# Patient Record
Sex: Female | Born: 1950 | Race: White | Hispanic: No | Marital: Married | Smoking: Never smoker
Health system: Southern US, Community
[De-identification: ages and names within clinical notes are randomized; demographics above are authoritative.]

## PROBLEM LIST (undated history)

## (undated) DIAGNOSIS — K219 Gastro-esophageal reflux disease without esophagitis: Secondary | ICD-10-CM

## (undated) DIAGNOSIS — I208 Other forms of angina pectoris: Secondary | ICD-10-CM

## (undated) DIAGNOSIS — I2089 Other forms of angina pectoris: Secondary | ICD-10-CM

## (undated) DIAGNOSIS — I10 Essential (primary) hypertension: Secondary | ICD-10-CM

## (undated) DIAGNOSIS — M1990 Unspecified osteoarthritis, unspecified site: Secondary | ICD-10-CM

## (undated) HISTORY — PX: BREAST BIOPSY: SHX20

## (undated) HISTORY — DX: Gastro-esophageal reflux disease without esophagitis: K21.9

## (undated) HISTORY — PX: BACK SURGERY: SHX140

## (undated) HISTORY — PX: DILATION AND CURETTAGE OF UTERUS: SHX78

## (undated) HISTORY — PX: TUBAL LIGATION: SHX77

---

## 2008-07-23 ENCOUNTER — Ambulatory Visit: Payer: Self-pay | Admitting: Family Medicine

## 2009-01-20 ENCOUNTER — Ambulatory Visit: Payer: Self-pay | Admitting: Internal Medicine

## 2010-08-30 ENCOUNTER — Ambulatory Visit: Payer: Self-pay | Admitting: Internal Medicine

## 2011-08-11 ENCOUNTER — Ambulatory Visit: Payer: Self-pay | Admitting: Internal Medicine

## 2012-04-24 HISTORY — PX: BACK SURGERY: SHX140

## 2014-11-05 ENCOUNTER — Ambulatory Visit
Admission: RE | Admit: 2014-11-05 | Discharge: 2014-11-05 | Disposition: A | Discharge status: Home / Self Care | Payer: Self-pay | Source: Ambulatory Visit | Attending: Family Medicine | Admitting: Family Medicine

## 2014-11-05 ENCOUNTER — Encounter: Payer: Self-pay | Admitting: Family Medicine

## 2014-11-05 ENCOUNTER — Ambulatory Visit (INDEPENDENT_AMBULATORY_CARE_PROVIDER_SITE_OTHER): Payer: Self-pay | Admitting: Family Medicine

## 2014-11-05 ENCOUNTER — Ambulatory Visit: Admission: RE | Admit: 2014-11-05 | Payer: Self-pay | Source: Ambulatory Visit | Admitting: *Deleted

## 2014-11-05 VITALS — BP 112/60 | HR 70 | Ht 65.0 in | Wt 199.0 lb

## 2014-11-05 DIAGNOSIS — M519 Unspecified thoracic, thoracolumbar and lumbosacral intervertebral disc disorder: Secondary | ICD-10-CM

## 2014-11-05 DIAGNOSIS — M545 Low back pain, unspecified: Secondary | ICD-10-CM

## 2014-11-05 DIAGNOSIS — M4647 Discitis, unspecified, lumbosacral region: Secondary | ICD-10-CM | POA: Insufficient documentation

## 2014-11-05 LAB — POCT URINALYSIS DIPSTICK
BILIRUBIN UA: NEGATIVE
Blood, UA: NEGATIVE
GLUCOSE UA: NEGATIVE
KETONES UA: NEGATIVE
LEUKOCYTES UA: NEGATIVE
Nitrite, UA: NEGATIVE
PH UA: 5
Protein, UA: NEGATIVE
Spec Grav, UA: 1.01
Urobilinogen, UA: 0.2

## 2014-11-05 MED ORDER — IBUPROFEN 800 MG PO TABS: 800.0000 mg | ORAL_TABLET | Freq: Three times a day (TID) | ORAL | Status: DC | PRN

## 2014-11-05 NOTE — Progress Notes (Signed)
Name: Lauren Leblanc   MRN: [id number]    DOB: 1950-11-03   Date:11/05/2014       Progress Note  Subjective  Chief Complaint  Chief Complaint  Patient presents with  . Back Pain    lower back pain on R) side- started 2 weeks ago, but worse in the past 2 days    Back Pain This is a new problem. The problem occurs constantly. The problem has been waxing and waning since onset. The pain is present in the lumbar spine and sacro-iliac. The quality of the pain is described as aching. The pain does not radiate. The pain is moderate. The symptoms are aggravated by position, twisting and bending. Pertinent negatives include no abdominal pain, bladder incontinence, bowel incontinence, chest pain, dysuria, fever, headaches, leg pain, numbness, paresis, paresthesias, pelvic pain, perianal numbness, tingling, weakness or weight loss. Risk factors: previous back surgery 2014. She has tried analgesics for the symptoms. The treatment provided mild relief.    No problem-specific assessment & plan notes found for this encounter.   No past medical history on file.  Past Surgical History  Procedure Laterality Date  . Back surgery    . Tubal ligation      No family history on file.  History   Social History  . Marital Status: Married    Spouse Name: N/A  . Number of Children: N/A  . Years of Education: N/A   Occupational History  . Not on file.   Social History Main Topics  . Smoking status: Never Smoker   . Smokeless tobacco: Not on file  . Alcohol Use: No  . Drug Use: No  . Sexual Activity: Not on file   Other Topics Concern  . Not on file   Social History Narrative  . No narrative on file    Allergies  Allergen Reactions  . Codeine      Review of Systems  Constitutional: Negative for fever, chills, weight loss and malaise/fatigue.  HENT: Negative for ear discharge, ear pain and sore throat.   Eyes: Negative for blurred vision.  Respiratory: Negative for cough,  sputum production, shortness of breath and wheezing.   Cardiovascular: Negative for chest pain, palpitations and leg swelling.  Gastrointestinal: Negative for heartburn, nausea, abdominal pain, diarrhea, constipation, blood in stool, melena and bowel incontinence.  Genitourinary: Negative for bladder incontinence, dysuria, urgency, frequency, hematuria and pelvic pain.  Musculoskeletal: Positive for back pain. Negative for myalgias, joint pain and neck pain.  Skin: Negative for rash.  Neurological: Negative for dizziness, tingling, sensory change, focal weakness, weakness, numbness, headaches and paresthesias.  Endo/Heme/Allergies: Negative for environmental allergies and polydipsia. Does not bruise/bleed easily.  Psychiatric/Behavioral: Negative for depression and suicidal ideas. The patient is not nervous/anxious and does not have insomnia.      Objective  Filed Vitals:   11/05/14 1417  BP: 112/60  Pulse: 70  Height:  (1.651 m)  Weight: 199 lb (90.266 kg)    Physical Exam  Constitutional: She is well-developed, well-nourished, and in no distress. No distress.  HENT:  Head: Normocephalic and atraumatic.  Right Ear: External ear normal.  Left Ear: External ear normal.  Nose: Nose normal.  Mouth/Throat: Oropharynx is clear and moist.  Eyes: Conjunctivae and EOM are normal. Pupils are equal, round, and reactive to light. Right eye exhibits no discharge. Left eye exhibits no discharge.  Neck: Normal range of motion. Neck supple. No JVD present. No thyromegaly present.  Cardiovascular: Normal rate, regular rhythm, normal  heart sounds and intact distal pulses.  Exam reveals no gallop and no friction rub.   No murmur heard. Pulmonary/Chest: Effort normal and breath sounds normal.  Abdominal: Soft. Bowel sounds are normal. She exhibits no mass. There is no tenderness. There is no guarding.  Musculoskeletal: Normal range of motion. She exhibits no edema.  Lymphadenopathy:    She  has no cervical adenopathy.  Neurological: She is alert. She has normal reflexes.  Skin: Skin is warm and dry. She is not diaphoretic. No erythema.  Psychiatric: Mood and affect normal.  Nursing note and vitals reviewed.     Assessment & Plan  Problem List Items Addressed This Visit    None    Visit Diagnoses    Lumbosacral disc disease    -  Primary    Relevant Medications    ibuprofen (ADVIL,MOTRIN) 800 MG tablet    Other Relevant Orders    DG Lumbar Spine Complete         Dr. Hayden Rasmusseneanna Jones [facility]  11/05/2014

## 2014-11-05 NOTE — Addendum Note (Signed)
Addended by: Duanne LimerickJONES, Tyke Outman C on: 11/05/2014 02:55 PM   Modules accepted: Orders

## 2015-05-22 ENCOUNTER — Ambulatory Visit
Admission: EM | Admit: 2015-05-22 | Discharge: 2015-05-22 | Disposition: A | Discharge status: Home / Self Care | Payer: Self-pay | Attending: Family Medicine | Admitting: Family Medicine

## 2015-05-22 DIAGNOSIS — J101 Influenza due to other identified influenza virus with other respiratory manifestations: Secondary | ICD-10-CM

## 2015-05-22 HISTORY — DX: Essential (primary) hypertension: I10

## 2015-05-22 LAB — RAPID INFLUENZA A&B ANTIGENS
Influenza A (ARMC): DETECTED
Influenza B (ARMC): NOT DETECTED

## 2015-05-22 MED ORDER — OSELTAMIVIR PHOSPHATE 75 MG PO CAPS: 75.0000 mg | ORAL_CAPSULE | Freq: Two times a day (BID) | ORAL | Status: DC

## 2015-05-22 NOTE — ED Provider Notes (Signed)
CSN: [id number]     Arrival date & time 05/22/15  1136 History   First MD Initiated Contact with Patient 05/22/15 1240     Chief Complaint  Patient presents with  . Influenza    Body aches, chills, cough, subjective fever, sore throat.  Sx started yesterday. Pain 9/10. No flu shot this season.   HPI  Lauren Leblanc is a pleasant 65 y.o. female with body aches, chills, cough, subjective fever, sore throat for 24 hrs. Pain 9/10. No flu shot this season. She is a nonsmoker reports a chronic cough for at least a month. Over the last 24 hours she has been coughing up green phlegm. She denies any shortness of breath. She has not tried any medications at home for the her symptoms. Nothing seems to exacerbate or alleviate her pain. She states she has had a fever but has not had a thermometer to check her temperature. Past Medical History  Diagnosis Date  . Hypertension    Past Surgical History  Procedure Laterality Date  . Back surgery    . Tubal ligation     History reviewed. No pertinent family history. Social History  Substance Use Topics  . Smoking status: Never Smoker   . Smokeless tobacco: None  . Alcohol Use: No   OB History    No data available     Review of Systems  Constitutional: Negative.   HENT: Positive for congestion, ear pain, postnasal drip, rhinorrhea, sinus pressure and sore throat.   Eyes: Negative.   Respiratory: Negative.   Cardiovascular: Negative.   Gastrointestinal: Negative.   Endocrine: Negative.   Genitourinary: Negative.   Musculoskeletal: Positive for myalgias and arthralgias. Negative for back pain, joint swelling, gait problem, neck pain and neck stiffness.  Skin: Negative.   Neurological: Negative.   Hematological: Negative.   Psychiatric/Behavioral: Negative.     Allergies  Codeine  Home Medications   Prior to Admission medications   Medication Sig Start Date End Date Taking? Authorizing Provider  glucosamine-chondroitin 500-400 MG  tablet Take 1 tablet by mouth 3 (three) times daily.   Yes Historical Provider, MD  hydrochlorothiazide (HYDRODIURIL) 12.5 MG tablet Take 12.5 mg by mouth daily.   Yes Historical Provider, MD  ibuprofen (ADVIL,MOTRIN) 800 MG tablet Take 1 tablet (800 mg total) by mouth every 8 (eight) hours as needed. 11/05/14   Duanne Limerick, MD  oseltamivir (TAMIFLU) 75 MG capsule Take 1 capsule (75 mg total) by mouth every 12 (twelve) hours. 05/22/15   Joselyn Arrow, NP   Meds Ordered and Administered this Visit  Medications - No data to display  BP 145/67 mmHg  Pulse 80  Temp(Src) 99 F (37.2 C) (Oral)  Resp 20  Ht 5' 5.5" (1.664 m)  Wt 202 lb (91.627 kg)  BMI 33.09 kg/m2  SpO2 100% No data found.   Physical Exam  Constitutional: She is oriented to person, place, and time. She appears well-developed and well-nourished. No distress.  HENT:  Head: Normocephalic and atraumatic.  Right Ear: Hearing normal. Tympanic membrane is injected and erythematous. A middle ear effusion is present.  Left Ear: Hearing, tympanic membrane, external ear and ear canal normal.  Nose: Mucosal edema and rhinorrhea present. Right sinus exhibits no maxillary sinus tenderness and no frontal sinus tenderness. Left sinus exhibits no maxillary sinus tenderness and no frontal sinus tenderness.  Mouth/Throat: Uvula is midline and mucous membranes are normal. Posterior oropharyngeal erythema present.  Eyes: Conjunctivae are normal. No scleral icterus.  Neck: Normal range of motion.  Cardiovascular: Normal rate and regular rhythm.   Pulmonary/Chest: Effort normal. No respiratory distress. She has wheezes in the right upper field, the right middle field, the right lower field, the left upper field, the left middle field and the left lower field.  Abdominal: Soft. Bowel sounds are normal. She exhibits no distension.  Musculoskeletal: Normal range of motion. She exhibits no edema or tenderness.  Neurological: She is alert and  oriented to person, place, and time. No cranial nerve deficit.  Skin: Skin is warm and dry. No rash noted. No erythema.  Psychiatric: Her behavior is normal. Judgment normal.  Nursing note and vitals reviewed.   ED Course  Procedures na  Labs Review Labs Reviewed  RAPID INFLUENZA A&B ANTIGENS ([facility] ONLY)   MDM   1. Influenza A   Patient stable.   Plan: Test results and diagnosis reviewed with patient Rx as per orders;  benefits, risks, potential side effects reviewed  Recommend supportive treatment with rest, tylenol prn Seek additional medical care if symptoms worsen or are not improving in next 5-7 days    Joselyn Arrow, NP 05/22/15 1303

## 2015-05-22 NOTE — Discharge Instructions (Signed)

## 2015-05-28 ENCOUNTER — Encounter: Payer: Self-pay | Admitting: Family Medicine

## 2015-05-28 ENCOUNTER — Ambulatory Visit (INDEPENDENT_AMBULATORY_CARE_PROVIDER_SITE_OTHER): Payer: Self-pay | Admitting: Family Medicine

## 2015-05-28 VITALS — BP 122/70 | HR 72 | Temp 98.1°F | Ht 65.0 in | Wt 193.0 lb

## 2015-05-28 DIAGNOSIS — J40 Bronchitis, not specified as acute or chronic: Secondary | ICD-10-CM

## 2015-05-28 DIAGNOSIS — J0100 Acute maxillary sinusitis, unspecified: Secondary | ICD-10-CM

## 2015-05-28 DIAGNOSIS — M940 Chondrocostal junction syndrome [Tietze]: Secondary | ICD-10-CM

## 2015-05-28 MED ORDER — BENZONATATE 100 MG PO CAPS: 100.0000 mg | ORAL_CAPSULE | Freq: Two times a day (BID) | ORAL | Status: DC | PRN

## 2015-05-28 MED ORDER — AMOXICILLIN-POT CLAVULANATE 875-125 MG PO TABS: 1.0000 | ORAL_TABLET | Freq: Two times a day (BID) | ORAL | Status: DC

## 2015-05-28 NOTE — Progress Notes (Signed)
Name: Lauren Leblanc   MRN: [id number]    DOB: 1951-03-09   Date:05/28/2015       Progress Note  Subjective  Chief Complaint  Chief Complaint  Patient presents with  . Follow-up    urgent care- Dx flu had 5 days of Tamiflu- cont with cough and cong    Cough This is a recurrent problem. The current episode started 1 to 4 weeks ago. The problem has been waxing and waning. The problem occurs constantly. The cough is productive of purulent sputum. Associated symptoms include chills, ear congestion, ear pain, a fever, heartburn, myalgias, nasal congestion, postnasal drip, rhinorrhea, shortness of breath and wheezing. Pertinent negatives include no chest pain, headaches, rash, sore throat, sweats or weight loss. She has tried OTC cough suppressant for the symptoms. The treatment provided mild relief. There is no history of asthma, bronchiectasis, bronchitis, COPD, emphysema, environmental allergies or pneumonia.    No problem-specific assessment & plan notes found for this encounter.   Past Medical History  Diagnosis Date  . Hypertension     Past Surgical History  Procedure Laterality Date  . Back surgery    . Tubal ligation      History reviewed. No pertinent family history.  Social History   Social History  . Marital Status: Married    Spouse Name: N/A  . Number of Children: N/A  . Years of Education: N/A   Occupational History  . Not on file.   Social History Main Topics  . Smoking status: Never Smoker   . Smokeless tobacco: Not on file  . Alcohol Use: No  . Drug Use: No  . Sexual Activity: Not Currently   Other Topics Concern  . Not on file   Social History Narrative    Allergies  Allergen Reactions  . Codeine     "hair crawling"     Review of Systems  Constitutional: Positive for fever and chills. Negative for weight loss and malaise/fatigue.  HENT: Positive for ear pain, postnasal drip and rhinorrhea. Negative for ear discharge and sore throat.    Eyes: Negative for blurred vision.  Respiratory: Positive for cough, shortness of breath and wheezing. Negative for sputum production.   Cardiovascular: Negative for chest pain, palpitations and leg swelling.  Gastrointestinal: Positive for heartburn. Negative for nausea, abdominal pain, diarrhea, constipation, blood in stool and melena.  Genitourinary: Negative for dysuria, urgency, frequency and hematuria.  Musculoskeletal: Positive for myalgias. Negative for back pain, joint pain and neck pain.  Skin: Negative for rash.  Neurological: Negative for dizziness, tingling, sensory change, focal weakness and headaches.  Endo/Heme/Allergies: Negative for environmental allergies and polydipsia. Does not bruise/bleed easily.  Psychiatric/Behavioral: Negative for depression and suicidal ideas. The patient is not nervous/anxious and does not have insomnia.      Objective  Filed Vitals:   05/28/15 1421  BP: 122/70  Pulse: 72  Temp: 98.1 F (36.7 C)  TempSrc: Oral  Height:  (1.651 m)  Weight: 193 lb (87.544 kg)  SpO2: 98%    Physical Exam  Constitutional: She is well-developed, well-nourished, and in no distress. No distress.  HENT:  Head: Normocephalic and atraumatic.  Right Ear: External ear normal.  Left Ear: External ear normal.  Nose: Nose normal.  Mouth/Throat: Oropharynx is clear and moist.  Eyes: Conjunctivae and EOM are normal. Pupils are equal, round, and reactive to light. Right eye exhibits no discharge. Left eye exhibits no discharge.  Neck: Normal range of motion. Neck supple. No JVD  present. No thyromegaly present.  Cardiovascular: Normal rate, regular rhythm, normal heart sounds and intact distal pulses.  Exam reveals no gallop and no friction rub.   No murmur heard. Pulmonary/Chest: Effort normal. No respiratory distress. She has wheezes. She has no rales. She exhibits tenderness.  Abdominal: Soft. Bowel sounds are normal. She exhibits no mass. There is no  tenderness. There is no guarding.  Musculoskeletal: Normal range of motion. She exhibits no edema.  Lymphadenopathy:    She has no cervical adenopathy.  Neurological: She is alert.  Skin: Skin is warm and dry. She is not diaphoretic.  Psychiatric: Mood and affect normal.  Nursing note and vitals reviewed.     Assessment & Plan  Problem List Items Addressed This Visit    None    Visit Diagnoses    Bronchitis    -  Primary    rob DM/ symbicort/     Relevant Medications    amoxicillin-clavulanate (AUGMENTIN) 875-125 MG tablet    benzonatate (TESSALON) 100 MG capsule    Acute maxillary sinusitis, recurrence not specified        Relevant Medications    amoxicillin-clavulanate (AUGMENTIN) 875-125 MG tablet    benzonatate (TESSALON) 100 MG capsule    Costochondritis             Dr. Hayden [facility]  05/28/2015

## 2015-05-28 NOTE — Patient Instructions (Signed)
How to Use an Inhaler °Proper inhaler technique is very important. Good technique ensures that the medicine reaches the lungs. Poor technique results in depositing the medicine on the tongue and back of the throat rather than in the airways. If you do not use the inhaler with good technique, the medicine will not help you. °STEPS TO FOLLOW IF USING AN INHALER WITHOUT AN EXTENSION TUBE °1. Remove the cap from the inhaler. °2. If you are using the inhaler for the first time, you will need to prime it. Shake the inhaler for 5 seconds and release four puffs into the air, away from your face. Ask your health care provider or pharmacist if you have questions about priming your inhaler. °3. Shake the inhaler for 5 seconds before each breath in (inhalation). °4. Position the inhaler so that the top of the canister faces up. °5. Put your index finger on the top of the medicine canister. Your thumb supports the bottom of the inhaler. °6. Open your mouth. °7. Either place the inhaler between your teeth and place your lips tightly around the mouthpiece, or hold the inhaler 1-2 inches away from your open mouth. If you are unsure of which technique to use, ask your health care provider. °8. Breathe out (exhale) normally and as completely as possible. °9. Press the canister down with your index finger to release the medicine. °10. At the same time as the canister is pressed, inhale deeply and slowly until your lungs are completely filled. This should take 4-6 seconds. Keep your tongue down. °11. Hold the medicine in your lungs for 5-10 seconds (10 seconds is best). This helps the medicine get into the small airways of your lungs. °12. Breathe out slowly, through pursed lips. Whistling is an example of pursed lips. °13. Wait at least 15-30 seconds between puffs. Continue with the above steps until you have taken the number of puffs your health care provider has ordered. Do not use the inhaler more than your health care provider  tells you. °14. Replace the cap on the inhaler. °15. Follow the directions from your health care provider or the inhaler insert for cleaning the inhaler. °STEPS TO FOLLOW IF USING AN INHALER WITH AN EXTENSION (SPACER) °1. Remove the cap from the inhaler. °2. If you are using the inhaler for the first time, you will need to prime it. Shake the inhaler for 5 seconds and release four puffs into the air, away from your face. Ask your health care provider or pharmacist if you have questions about priming your inhaler. °3. Shake the inhaler for 5 seconds before each breath in (inhalation). °4. Place the open end of the spacer onto the mouthpiece of the inhaler. °5. Position the inhaler so that the top of the canister faces up and the spacer mouthpiece faces you. °6. Put your index finger on the top of the medicine canister. Your thumb supports the bottom of the inhaler and the spacer. °7. Breathe out (exhale) normally and as completely as possible. °8. Immediately after exhaling, place the spacer between your teeth and into your mouth. Close your lips tightly around the spacer. °9. Press the canister down with your index finger to release the medicine. °10. At the same time as the canister is pressed, inhale deeply and slowly until your lungs are completely filled. This should take 4-6 seconds. Keep your tongue down and out of the way. °11. Hold the medicine in your lungs for 5-10 seconds (10 seconds is best). This helps the   medicine get into the small airways of your lungs. Exhale. °12. Repeat inhaling deeply through the spacer mouthpiece. Again hold that breath for up to 10 seconds (10 seconds is best). Exhale slowly. If it is difficult to take this second deep breath through the spacer, breathe normally several times through the spacer. Remove the spacer from your mouth. °13. Wait at least 15-30 seconds between puffs. Continue with the above steps until you have taken the number of puffs your health care provider has  ordered. Do not use the inhaler more than your health care provider tells you. °14. Remove the spacer from the inhaler, and place the cap on the inhaler. °15. Follow the directions from your health care provider or the inhaler insert for cleaning the inhaler and spacer. °If you are using different kinds of inhalers, use your quick relief medicine to open the airways 10-15 minutes before using a steroid if instructed to do so by your health care provider. If you are unsure which inhalers to use and the order of using them, ask your health care provider, nurse, or respiratory therapist. °If you are using a steroid inhaler, always rinse your mouth with water after your last puff, then gargle and spit out the water. Do not swallow the water. °AVOID: °· Inhaling before or after starting the spray of medicine. It takes practice to coordinate your breathing with triggering the spray. °· Inhaling through the nose (rather than the mouth) when triggering the spray. °HOW TO DETERMINE IF YOUR INHALER IS FULL OR NEARLY EMPTY °You cannot know when an inhaler is empty by shaking it. A few inhalers are now being made with dose counters. Ask your health care provider for a prescription that has a dose counter if you feel you need that extra help. If your inhaler does not have a counter, ask your health care provider to help you determine the date you need to refill your inhaler. Write the refill date on a calendar or your inhaler canister. Refill your inhaler 7-10 days before it runs out. Be sure to keep an adequate supply of medicine. This includes making sure it is not expired, and that you have a spare inhaler.  °SEEK MEDICAL CARE IF:  °· Your symptoms are only partially relieved with your inhaler. °· You are having trouble using your inhaler. °· You have some increase in phlegm. °SEEK IMMEDIATE MEDICAL CARE IF:  °· You feel little or no relief with your inhalers. You are still wheezing and are feeling shortness of breath or  tightness in your chest or both. °· You have dizziness, headaches, or a fast heart rate. °· You have chills, fever, or night sweats. °· You have a noticeable increase in phlegm production, or there is blood in the phlegm. °MAKE SURE YOU:  °· Understand these instructions. °· Will watch your condition. °· Will get help right away if you are not doing well or get worse. °  °This information is not intended to replace advice given to you by your health care provider. Make sure you discuss any questions you have with your health care provider. °  °Document Released: 04/07/2000 Document Revised: 01/29/2013 Document Reviewed: 11/07/2012 °[employer] Interactive Patient Education ©2016 [employer] ° °

## 2015-06-21 ENCOUNTER — Ambulatory Visit (INDEPENDENT_AMBULATORY_CARE_PROVIDER_SITE_OTHER): Payer: Self-pay | Admitting: Family Medicine

## 2015-06-21 ENCOUNTER — Encounter: Payer: Self-pay | Admitting: Family Medicine

## 2015-06-21 VITALS — BP 120/80 | HR 76 | Ht 65.0 in | Wt 193.0 lb

## 2015-06-21 DIAGNOSIS — K219 Gastro-esophageal reflux disease without esophagitis: Secondary | ICD-10-CM

## 2015-06-21 DIAGNOSIS — R053 Chronic cough: Secondary | ICD-10-CM

## 2015-06-21 DIAGNOSIS — R05 Cough: Secondary | ICD-10-CM

## 2015-06-21 MED ORDER — PANTOPRAZOLE SODIUM 40 MG PO TBEC: 40.0000 mg | DELAYED_RELEASE_TABLET | Freq: Every day | ORAL | Status: DC

## 2015-06-21 NOTE — Progress Notes (Signed)
Name: Lauren Leblanc   MRN: [id number]    DOB: Apr 27, 1950   Date:06/21/2015       Progress Note  Subjective  Chief Complaint  Chief Complaint  Patient presents with  . Cough    "think it is related to acid reflux"    Cough This is a recurrent problem. The current episode started more than 1 year ago. The problem has been waxing and waning. The problem occurs every few hours. The cough is non-productive. Associated symptoms include wheezing. Pertinent negatives include no chest pain, chills, ear congestion, ear pain, fever, headaches, heartburn, hemoptysis, myalgias, nasal congestion, postnasal drip, rash, rhinorrhea, sore throat, shortness of breath, sweats or weight loss. The symptoms are aggravated by cold air. The treatment provided mild relief. There is no history of environmental allergies.    No problem-specific assessment & plan notes found for this encounter.   Past Medical History  Diagnosis Date  . Hypertension     Past Surgical History  Procedure Laterality Date  . Back surgery    . Tubal ligation      History reviewed. No pertinent family history.  Social History   Social History  . Marital Status: Married    Spouse Name: N/A  . Number of Children: N/A  . Years of Education: N/A   Occupational History  . Not on file.   Social History Main Topics  . Smoking status: Never Smoker   . Smokeless tobacco: Not on file  . Alcohol Use: No  . Drug Use: No  . Sexual Activity: Not Currently   Other Topics Concern  . Not on file   Social History Narrative    Allergies  Allergen Reactions  . Codeine     "hair crawling"     Review of Systems  Constitutional: Negative for fever, chills, weight loss and malaise/fatigue.  HENT: Negative for ear discharge, ear pain, postnasal drip, rhinorrhea and sore throat.   Eyes: Negative for blurred vision.  Respiratory: Positive for cough and wheezing. Negative for hemoptysis, sputum production and shortness of  breath.   Cardiovascular: Negative for chest pain, palpitations and leg swelling.  Gastrointestinal: Negative for heartburn, nausea, abdominal pain, diarrhea, constipation, blood in stool and melena.  Genitourinary: Negative for dysuria, urgency, frequency and hematuria.  Musculoskeletal: Negative for myalgias, back pain, joint pain and neck pain.  Skin: Negative for rash.  Neurological: Negative for dizziness, tingling, sensory change, focal weakness and headaches.  Endo/Heme/Allergies: Negative for environmental allergies and polydipsia. Does not bruise/bleed easily.  Psychiatric/Behavioral: Negative for depression and suicidal ideas. The patient is not nervous/anxious and does not have insomnia.      Objective  Filed Vitals:   06/21/15 1335  BP: 120/80  Pulse: 76  Height:  (1.651 m)  Weight: 193 lb (87.544 kg)  SpO2: 98%    Physical Exam  Constitutional: She is well-developed, well-nourished, and in no distress. No distress.  HENT:  Head: Normocephalic and atraumatic.  Right Ear: External ear normal.  Left Ear: External ear normal.  Nose: Nose normal.  Mouth/Throat: Oropharynx is clear and moist.  Eyes: Conjunctivae and EOM are normal. Pupils are equal, round, and reactive to light. Right eye exhibits no discharge. Left eye exhibits no discharge.  Neck: Normal range of motion. Neck supple. No JVD present. No thyromegaly present.  Cardiovascular: Normal rate, regular rhythm, normal heart sounds and intact distal pulses.  Exam reveals no gallop and no friction rub.   No murmur heard. Pulmonary/Chest: Effort normal and  breath sounds normal.  Abdominal: Soft. Bowel sounds are normal. She exhibits no mass. There is no tenderness. There is no guarding.  Musculoskeletal: Normal range of motion. She exhibits no edema.  Lymphadenopathy:    She has no cervical adenopathy.  Neurological: She is alert. She has normal reflexes.  Skin: Skin is warm and dry. She is not diaphoretic.   Psychiatric: Mood and affect normal.  Nursing note and vitals reviewed.     Assessment & Plan  Problem List Items Addressed This Visit    None    Visit Diagnoses    Chronic cough    -  Primary    Relevant Medications    pantoprazole (PROTONIX) 40 MG tablet    Other Relevant Orders    DG Esophagus    Gastroesophageal reflux disease, esophagitis presence not specified        Relevant Medications    pantoprazole (PROTONIX) 40 MG tablet    Other Relevant Orders    DG Esophagus         Dr. Hayden [facility]  06/21/2015

## 2015-06-25 ENCOUNTER — Ambulatory Visit
Admission: RE | Admit: 2015-06-25 | Discharge: 2015-06-25 | Disposition: A | Discharge status: Home / Self Care | Payer: Self-pay | Source: Ambulatory Visit | Attending: Family Medicine | Admitting: Family Medicine

## 2015-06-25 DIAGNOSIS — K219 Gastro-esophageal reflux disease without esophagitis: Secondary | ICD-10-CM | POA: Insufficient documentation

## 2015-06-25 DIAGNOSIS — R05 Cough: Secondary | ICD-10-CM | POA: Insufficient documentation

## 2015-06-25 DIAGNOSIS — R053 Chronic cough: Secondary | ICD-10-CM

## 2015-06-25 DIAGNOSIS — K449 Diaphragmatic hernia without obstruction or gangrene: Secondary | ICD-10-CM | POA: Insufficient documentation

## 2015-08-12 ENCOUNTER — Ambulatory Visit (INDEPENDENT_AMBULATORY_CARE_PROVIDER_SITE_OTHER): Payer: Self-pay | Admitting: Family Medicine

## 2015-08-12 ENCOUNTER — Encounter: Payer: Self-pay | Admitting: Family Medicine

## 2015-08-12 VITALS — BP 120/82 | HR 78 | Ht 65.0 in | Wt 199.0 lb

## 2015-08-12 DIAGNOSIS — S838X2A Sprain of other specified parts of left knee, initial encounter: Secondary | ICD-10-CM

## 2015-08-12 DIAGNOSIS — S8392XA Sprain of unspecified site of left knee, initial encounter: Secondary | ICD-10-CM

## 2015-08-12 NOTE — Progress Notes (Signed)
Name: Lauren Leblanc   MRN: [id number]    DOB: [birth year]   Date:08/12/2015       Progress Note  Subjective  Chief Complaint  Chief Complaint  Patient presents with  . Knee Pain    L) knee hurting x 2 weeks- hurts worse when walking for long periods of time. A pain that "goes down R) side of knee cap, can't straighten my leg"    Knee Pain  The incident occurred more than 1 week ago. The incident occurred in the yard. The injury mechanism was a twisting injury. The pain is present in the left knee. The quality of the pain is described as aching. The pain is at a severity of 6/10. The pain is moderate. The pain has been intermittent since onset. Pertinent negatives include no inability to bear weight, loss of motion, loss of sensation, muscle weakness, numbness or tingling. She has tried nothing for the symptoms.    No problem-specific assessment & plan notes found for this encounter.   Past Medical History  Diagnosis Date  . Hypertension   . GERD (gastroesophageal reflux disease)     Past Surgical History  Procedure Laterality Date  . Back surgery    . Tubal ligation      History reviewed. No pertinent family history.  Social History   Social History  . Marital Status: Married    Spouse Name: N/A  . Number of Children: N/A  . Years of Education: N/A   Occupational History  . Not on file.   Social History Main Topics  . Smoking status: Never Smoker   . Smokeless tobacco: Not on file  . Alcohol Use: No  . Drug Use: No  . Sexual Activity: Not Currently   Other Topics Concern  . Not on file   Social History Narrative    Allergies  Allergen Reactions  . Codeine     "hair crawling"     Review of Systems  Constitutional: Negative for fever, chills, weight loss and malaise/fatigue.  HENT: Negative for ear discharge, ear pain and sore throat.   Eyes: Negative for blurred vision.  Respiratory: Negative for cough, sputum production, shortness of breath and  wheezing.   Cardiovascular: Negative for chest pain, palpitations and leg swelling.  Gastrointestinal: Negative for heartburn, nausea, abdominal pain, diarrhea, constipation, blood in stool and melena.  Genitourinary: Negative for dysuria, urgency, frequency and hematuria.  Musculoskeletal: Positive for joint pain. Negative for myalgias, back pain, falls and neck pain.  Skin: Negative for rash.  Neurological: Negative for dizziness, tingling, sensory change, focal weakness, numbness and headaches.  Endo/Heme/Allergies: Negative for environmental allergies and polydipsia. Does not bruise/bleed easily.  Psychiatric/Behavioral: Negative for depression and suicidal ideas. The patient is not nervous/anxious and does not have insomnia.      Objective  Filed Vitals:   08/12/15 1355  BP: 120/82  Pulse: 78  Height:  (1.651 m)  Weight: 199 lb (90.266 kg)    Physical Exam  Cardiovascular: Normal rate, regular rhythm, normal heart sounds and intact distal pulses.  Exam reveals no friction rub.   No murmur heard. Pulmonary/Chest: No respiratory distress. She has no wheezes. She has no rales.  Musculoskeletal:       Left knee: She exhibits abnormal patellar mobility and abnormal meniscus. She exhibits normal range of motion, no swelling, no effusion, no LCL laxity and no MCL laxity. Tenderness found. Medial joint line tenderness noted.  Nursing note and vitals reviewed.  Assessment & Plan  Problem List Items Addressed This Visit    None    Visit Diagnoses    Meniscal injury, left, initial encounter    -  Primary         Dr. Elizabeth Sauereanna Millie [facility]  08/12/2015

## 2015-08-12 NOTE — Patient Instructions (Signed)
Meniscus Tear °A meniscus tear is a knee injury in which a piece of the meniscus is torn. The meniscus is a thick, rubbery, wedge-shaped cartilage in the knee. Two menisci are located in each knee. They sit between the upper bone (femur) and lower bone (tibia) that make up the knee joint. Each meniscus acts as a shock absorber for the knee. °A torn meniscus is one of the most common types of knee injuries. This injury can range from mild to severe. Surgery may be needed for a severe tear. °CAUSES °This injury may be caused by any squatting, twisting, or pivoting movement. Sports-related injuries are the most common cause. These often occur from: °· Running and stopping suddenly. °· Changing direction. °· Being tackled or knocked off your feet. °As people get older, their meniscus gets thinner and weaker. In these people, tears can happen more easily, such as from climbing stairs.  °RISK FACTORS °This injury is more likely to happen to: °· People who play contact sports. °· Males. °· People who are 30-40 years of age. °SYMPTOMS  °Symptoms of this injury include: °· Knee pain, especially at the side of the knee joint. You may feel pain when the injury occurs, or you may only hear a pop and feel pain later. °· A feeling that your knee is clicking, catching, locking, or giving way. °· Not being able to fully bend or extend your knee. °· Bruising or swelling in your knee. °DIAGNOSIS  °This injury may be diagnosed based on your symptoms and a physical exam. The physical exam may include: °· Moving your knee in different ways. °· Feeling for tenderness. °· Listening for a clicking sound. °· Checking if your knee locks or catches. °You may also have tests, such as: °· X-rays. °· MRI. °· A procedure to look inside your knee with a narrow surgical telescope (arthroscopy). °You may be referred to a knee specialist (orthopedic surgeon). °TREATMENT  °Treatment for this injury depends on the severity of the tear. Treatment for a  mild tear may include: °· Rest. °· Medicine to reduce pain and swelling. This is usually a nonsteroidal anti-inflammatory drug (NSAID). °· A knee brace or an elastic sleeve or wrap. °· Using crutches or a walker to keep weight off your knee and to help you walk. °· Exercises to strengthen your knee (physical therapy). °You may need surgery if you have a severe tear or if other treatments are not working.  °HOME CARE INSTRUCTIONS °Managing Pain and Swelling °· Take over-the-counter and prescription medicines only as told by your health care provider. °· If directed, apply ice to the injured area: °¨ Put ice in a plastic bag. °¨ Place a towel between your skin and the bag. °¨ Leave the ice on for 20 minutes, 2-3 times per day. °· Raise (elevate) the injured area above the level of your heart while you are sitting or lying down. °Activity °· Do not use the injured limb to support your body weight until your health care provider says that you can. Use crutches or a walker as told by your health care provider. °· Return to your normal activities as told by your health care provider. Ask your health care provider what activities are safe for you. °· Perform range-of-motion exercises only as told by your health care provider. °· Begin doing exercises to strengthen your knee and leg muscles only as told by your health care provider. After you recover, your health care provider may recommend these exercises to   help prevent another injury. °General Instructions °· Use a knee brace or elastic wrap as told by your health care provider. °· Keep all follow-up visits as told by your health care provider. This is important. °SEEK MEDICAL CARE IF: °· You have a fever. °· Your knee becomes red, tender, or swollen. °· Your pain medicine is not helping. °· Your symptoms get worse or do not improve after 2 weeks of home care. °  °This information is not intended to replace advice given to you by your health care provider. Make sure you  discuss any questions you have with your health care provider. °  °Document Released: 07/01/2002 Document Revised: 12/30/2014 Document Reviewed: 08/03/2014 °Elsevier Interactive Patient Education ©2016 [employer] ° °

## 2015-08-30 ENCOUNTER — Other Ambulatory Visit: Payer: Self-pay | Admitting: Family Medicine

## 2015-09-21 ENCOUNTER — Other Ambulatory Visit: Payer: Self-pay

## 2015-09-21 MED ORDER — CLARITHROMYCIN 250 MG PO TABS: 250.0000 mg | ORAL_TABLET | Freq: Two times a day (BID) | ORAL | Status: DC

## 2015-09-21 MED ORDER — AMOXICILLIN 500 MG PO CAPS: 500.0000 mg | ORAL_CAPSULE | Freq: Two times a day (BID) | ORAL | Status: DC

## 2015-09-29 ENCOUNTER — Other Ambulatory Visit: Payer: Self-pay | Admitting: Family Medicine

## 2015-11-01 ENCOUNTER — Other Ambulatory Visit: Payer: Self-pay | Admitting: Family Medicine

## 2015-12-02 ENCOUNTER — Other Ambulatory Visit: Payer: Self-pay | Admitting: Family Medicine

## 2016-01-04 ENCOUNTER — Other Ambulatory Visit: Payer: Self-pay | Admitting: Family Medicine

## 2016-01-06 ENCOUNTER — Encounter: Payer: Self-pay | Admitting: Family Medicine

## 2016-01-06 ENCOUNTER — Ambulatory Visit (INDEPENDENT_AMBULATORY_CARE_PROVIDER_SITE_OTHER): Payer: Self-pay | Admitting: Family Medicine

## 2016-01-06 VITALS — BP 140/98 | HR 80 | Ht 65.0 in | Wt 197.0 lb

## 2016-01-06 DIAGNOSIS — I10 Essential (primary) hypertension: Secondary | ICD-10-CM | POA: Insufficient documentation

## 2016-01-06 LAB — GLUCOSE, POCT (MANUAL RESULT ENTRY): POC Glucose: 122 mg/dl — AB (ref 70–99)

## 2016-01-06 MED ORDER — LISINOPRIL-HYDROCHLOROTHIAZIDE 10-12.5 MG PO TABS
1.0000 | ORAL_TABLET | Freq: Every day | ORAL | 3 refills | Status: DC
Start: 2016-01-06 — End: 2016-01-11

## 2016-01-06 NOTE — Patient Instructions (Signed)

## 2016-01-06 NOTE — Progress Notes (Signed)
Name: Lauren Leblanc   MRN: [id number]    DOB: Jan 21, 1951   Date:01/06/2016       Progress Note  Subjective  Chief Complaint  Chief Complaint  Patient presents with  . Hypertension    Hypertension  This is a chronic problem. The current episode started more than 1 year ago. The problem has been gradually worsening since onset. The problem is uncontrolled. Associated symptoms include malaise/fatigue. Pertinent negatives include no anxiety, blurred vision, chest pain, headaches, neck pain, orthopnea, palpitations, peripheral edema, PND, shortness of breath or sweats. There are no associated agents to hypertension. There are no known risk factors for coronary artery disease. Past treatments include diuretics. The current treatment provides mild improvement. There is no history of angina, kidney disease, CAD/MI, CVA, heart failure, left ventricular hypertrophy, PVD, renovascular disease or retinopathy. There is no history of chronic renal disease or a hypertension causing med.    No problem-specific Assessment & Plan notes found for this encounter.   Past Medical History:  Diagnosis Date  . GERD (gastroesophageal reflux disease)   . Hypertension     Past Surgical History:  Procedure Laterality Date  . BACK SURGERY    . TUBAL LIGATION      History reviewed. No pertinent family history.  Social History   Social History  . Marital status: Married    Spouse name: N/A  . Number of children: N/A  . Years of education: N/A   Occupational History  . Not on file.   Social History Main Topics  . Smoking status: Never Smoker  . Smokeless tobacco: Not on file  . Alcohol use No  . Drug use: No  . Sexual activity: Not Currently   Other Topics Concern  . Not on file   Social History Narrative  . No narrative on file    Allergies  Allergen Reactions  . Codeine     "hair crawling"     Review of Systems  Constitutional: Positive for malaise/fatigue. Negative for  chills, fever and weight loss.  HENT: Negative for ear discharge, ear pain and sore throat.   Eyes: Negative for blurred vision.  Respiratory: Negative for cough, sputum production, shortness of breath and wheezing.   Cardiovascular: Negative for chest pain, palpitations, orthopnea, leg swelling and PND.  Gastrointestinal: Negative for abdominal pain, blood in stool, constipation, diarrhea, heartburn, melena and nausea.  Genitourinary: Negative for dysuria, frequency, hematuria and urgency.  Musculoskeletal: Negative for back pain, joint pain, myalgias and neck pain.  Skin: Negative for rash.  Neurological: Negative for dizziness, tingling, sensory change, focal weakness and headaches.  Endo/Heme/Allergies: Negative for environmental allergies and polydipsia. Does not bruise/bleed easily.  Psychiatric/Behavioral: Negative for depression and suicidal ideas. The patient is not nervous/anxious and does not have insomnia.      Objective  Vitals:   01/06/16 0859  BP: (!) 140/98  Pulse: 80  Weight: 197 lb (89.4 kg)  Height: 5\' 5"  (1.651 m)    Physical Exam  Constitutional: She is well-developed, well-nourished, and in no distress. No distress.  HENT:  Head: Normocephalic and atraumatic.  Right Ear: External ear normal.  Left Ear: External ear normal.  Nose: Nose normal.  Mouth/Throat: Oropharynx is clear and moist.  Eyes: Conjunctivae and EOM are normal. Pupils are equal, round, and reactive to light. Right eye exhibits no discharge. Left eye exhibits no discharge.  Neck: Normal range of motion. Neck supple. No JVD present. No thyromegaly present.  Cardiovascular: Normal rate, regular rhythm, normal  heart sounds and intact distal pulses.  Exam reveals no gallop and no friction rub.   No murmur heard. Pulmonary/Chest: Effort normal and breath sounds normal. She has no wheezes. She has no rales.  Abdominal: Soft. Bowel sounds are normal. She exhibits no mass. There is no tenderness.  There is no guarding.  Musculoskeletal: Normal range of motion. She exhibits no edema.  Lymphadenopathy:    She has no cervical adenopathy.  Neurological: She is alert. She has normal reflexes.  Skin: Skin is warm and dry. She is not diaphoretic.  Psychiatric: Mood and affect normal.  Nursing note and vitals reviewed.     Assessment & Plan  Problem List Items Addressed This Visit      Cardiovascular and Mediastinum   Essential hypertension - Primary   Relevant Medications   lisinopril-hydrochlorothiazide (PRINZIDE,ZESTORETIC) 10-12.5 MG tablet   Other Relevant Orders   POCT Glucose (CBG) (Completed)    Other Visit Diagnoses   None.       Dr. Hayden Rasmusseneanna Kiona Blume [facility]  01/06/16

## 2016-01-10 ENCOUNTER — Emergency Department: Payer: Self-pay

## 2016-01-10 ENCOUNTER — Emergency Department
Admission: EM | Admit: 2016-01-10 | Discharge: 2016-01-10 | Disposition: A | Discharge status: Home / Self Care | Payer: Self-pay | Attending: Emergency Medicine | Admitting: Emergency Medicine

## 2016-01-10 ENCOUNTER — Telehealth: Payer: Self-pay

## 2016-01-10 DIAGNOSIS — I10 Essential (primary) hypertension: Secondary | ICD-10-CM | POA: Insufficient documentation

## 2016-01-10 DIAGNOSIS — R0602 Shortness of breath: Secondary | ICD-10-CM | POA: Insufficient documentation

## 2016-01-10 DIAGNOSIS — R0789 Other chest pain: Secondary | ICD-10-CM | POA: Insufficient documentation

## 2016-01-10 DIAGNOSIS — R079 Chest pain, unspecified: Secondary | ICD-10-CM

## 2016-01-10 LAB — COMPREHENSIVE METABOLIC PANEL
ALK PHOS: 50 U/L (ref 38–126)
ALT: 15 U/L (ref 14–54)
AST: 22 U/L (ref 15–41)
Albumin: 3.8 g/dL (ref 3.5–5.0)
Anion gap: 6 (ref 5–15)
BUN: 24 mg/dL — AB (ref 6–20)
CALCIUM: 8 mg/dL — AB (ref 8.9–10.3)
CHLORIDE: 111 mmol/L (ref 101–111)
CO2: 24 mmol/L (ref 22–32)
CREATININE: 0.66 mg/dL (ref 0.44–1.00)
GFR calc Af Amer: >60 mL/min (ref >60)
Glucose, Bld: 102 mg/dL — ABNORMAL HIGH (ref 65–99)
Potassium: 3.7 mmol/L (ref 3.5–5.1)
Sodium: 141 mmol/L (ref 135–145)
Total Protein: 6 g/dL — ABNORMAL LOW (ref 6.5–8.1)

## 2016-01-10 LAB — CBC
HEMATOCRIT: 38.1 % (ref 35.0–47.0)
HEMOGLOBIN: 13.4 g/dL (ref 12.0–16.0)
MCH: 30.5 pg (ref 26.0–34.0)
MCHC: 35.1 g/dL (ref 32.0–36.0)
MCV: 86.9 fL (ref 80.0–100.0)
PLATELETS: 257 10*3/uL (ref 150–440)
RBC: 4.38 MIL/uL (ref 3.80–5.20)
RDW: 12.9 % (ref 11.5–14.5)
WBC: 4.9 10*3/uL (ref 3.6–11.0)

## 2016-01-10 LAB — TROPONIN I: Troponin I: <0.03 ng/mL (ref <0.03)

## 2016-01-10 LAB — LIPASE, BLOOD: Lipase: 29 U/L (ref 11–51)

## 2016-01-10 MED ORDER — GI COCKTAIL ~~LOC~~
30.0000 mL | Freq: Once | ORAL | Status: AC
  Administered 2016-01-10: 30 mL via ORAL
  Filled 2016-01-10: qty 30

## 2016-01-10 NOTE — ED Provider Notes (Signed)
[facility] Emergency Department Provider Note  Time seen: 7:34 AM  I have reviewed the triage vital signs and the nursing notes.   HISTORY  Chief Complaint Chest Pain    HPI Lauren Leblanc is a 65 y.o. female with a past medical history of gastric reflux, hypertension who presents the emergency department with right-sided chest pain. According to the patient around 2:00 this morning she developed right sided chest pain which she describes as a pressure sensation with radiation to the right neck. Patient states some shortness of breath associated with this but denies any nausea or diaphoresis. Patient does state a significant history of gastric reflux although states this felt somewhat different. She received aspirin and 2 sprays of nitroglycerin by EMS, she states the pain improved from an 8/10 to a 5/10 which it is currently. Describes the pain as a pressure sensation located in the right chest.  Past Medical History:  Diagnosis Date  . GERD (gastroesophageal reflux disease)   . Hypertension     Patient Active Problem List   Diagnosis Date Noted  . Essential hypertension 01/06/2016    Past Surgical History:  Procedure Laterality Date  . BACK SURGERY    . TUBAL LIGATION      Prior to Admission medications   Medication Sig Start Date End Date Taking? Authorizing Provider  glucosamine-chondroitin 500-400 MG tablet Take 1 tablet by mouth 3 (three) times daily.    Historical Provider, MD  lisinopril-hydrochlorothiazide (PRINZIDE,ZESTORETIC) 10-12.5 MG tablet Take 1 tablet by mouth daily. 01/06/16   Duanne Limerick, MD  pantoprazole (PROTONIX) 40 MG tablet Take 1 tablet (40 mg total) by mouth daily. Patient not taking: Reported on 01/06/2016 06/21/15   Duanne Limerick, MD    Allergies  Allergen Reactions  . Codeine     "hair crawling"    No family history on file.  Social History Social History  Substance Use Topics  . Smoking status: Never  Smoker  . Smokeless tobacco: Not on file  . Alcohol use No    Review of Systems Constitutional: Negative for fever. Cardiovascular: Right chest pain Respiratory: Mild shortness of breath Gastrointestinal: Negative for abdominal pain Musculoskeletal: Negative for back pain. Denies leg pain or swelling Neurological: Negative for headache 10-point ROS otherwise negative.  ____________________________________________   PHYSICAL EXAM:  VITAL SIGNS: ED Triage Vitals [01/10/16 0728]  Enc Vitals Group     BP 128/72     Pulse Rate (!) 56     Resp 12     Temp 97.7 F (36.5 C)     Temp Source Oral     SpO2 99 %     Weight 197 lb (89.4 kg)     Height 5\' 5"  (1.651 m)     Head Circumference      Peak Flow      Pain Score 5     Pain Loc      Pain Edu?      Excl. in GC?     Constitutional: Alert and oriented. Well appearing and in no distress. Eyes: Normal exam ENT   Head: Normocephalic and atraumatic.   Mouth/Throat: Mucous membranes are moist. Cardiovascular: Normal rate, regular rhythm. No murmur Respiratory: Normal respiratory effort without tachypnea nor retractions. Breath sounds are clear Gastrointestinal: Soft and nontender. No distention.  Musculoskeletal: Nontender with normal range of motion in all extremities. No lower extremity tenderness or edema. Neurologic:  Normal speech and language. No gross focal neurologic deficits Skin:  Skin is warm, dry and intact.  Psychiatric: Mood and affect are normal. Speech and behavior are normal.   ____________________________________________    EKG  EKG reviewed and interpreted by myself shows normal sinus rhythm at 54 bpm, narrow QRS, normal axis, normal intervals, no ST changes. Normal EKG.  ____________________________________________    RADIOLOGY  Chest x-ray negative  ____________________________________________   INITIAL IMPRESSION / ASSESSMENT AND PLAN / ED COURSE  Pertinent labs & imaging results  that were available during my care of the patient were reviewed by me and considered in my medical decision making (see chart for details).  Patient presents the emergency department with right-sided chest pain and shortness of breath. She states a significant history of gastric reflux although states this feels somewhat different. Patient denies any cardiac history. Patient does have right-sided chest pain and describes some radiation towards the neck which is concerning for a cardiac cause. We will check labs including cardiac enzymes, obtained a chest x-ray. Patient has received aspirin and nitroglycerin. We will dose of GI cocktail and closely monitor in the emergency department on telemetry.  Patient's labs are within normal limits. Troponin is negative. Chest x-ray is negative. EKG is normal. Patient states the discomfort is still present but may be a 2/10, much improved from earlier. Given a completely normal workup thus far I discussed with the patient repeating a troponin and approximately 2 hours, the second troponin is negative we will discuss with cardiology to obtain an expedient follow-up for the patient. Patient is agreeable to this plan.  Second troponin is negative. Patient's discomfort is nearly gone. I discussed the patient with Dr.Arida, who will get the patient into the office this week for a stress test. Patient is agreeable to this plan. I discussed strict chest pain return precautions. ____________________________________________   FINAL CLINICAL IMPRESSION(S) / ED DIAGNOSES  Chest pain    Minna AntisKevin Eleaner Dibartolo, MD 01/10/16 1104

## 2016-01-10 NOTE — ED Notes (Signed)
MD at bedside. 

## 2016-01-10 NOTE — ED Notes (Signed)
Pt alert and oriented X4, active, cooperative, pt in NAD. RR even and unlabored, color WNL.  Pt informed to return if any life threatening symptoms occur.  Pt ambulatory. 

## 2016-01-10 NOTE — Discharge Instructions (Signed)
You have been seen in the emergency department today for chest pain. Your workup has shown normal results. As we discussed please follow-up with your primary care physician in the next 1-2 days for recheck. Return to the emergency department for any further chest pain, trouble breathing, or any other symptom personally concerning to yourself. °

## 2016-01-10 NOTE — ED Triage Notes (Addendum)
Pt arrives to ER via [facility] from home with chest pressure 8/10 to right chest that awoke her from sleep at 2AM. Pt describes radiation down side of right neck and down right arm. Pt received 324 baby ASA and 1 nitro spray via [facility]. 20 G to L hand initiated by [facility]. CBG 110. VSS. Pt alert and oriented X4, active, cooperative, pt in NAD. RR even and unlabored, color WNL.

## 2016-01-10 NOTE — Telephone Encounter (Signed)
-----   Message from [place] OuchMuhammad A Arida, MD sent at 01/10/2016 10:55 AM EDT ----- ED referral for chest pain.  Needs an appointment this week with first available MD.

## 2016-01-10 NOTE — ED Notes (Signed)
Pt and family given drinks and snack. Updated on plan of care. Awaiting draw time for repeat troponin. Pt alert and oriented X4, active, cooperative, pt in NAD. RR even and unlabored, color WNL.  Denies further needs at this time.

## 2016-01-10 NOTE — Telephone Encounter (Signed)
L MOM to see if pt can come tomorrow for ED f/u. To see Dr. Okey DupreEnd.

## 2016-01-11 ENCOUNTER — Emergency Department
Admission: EM | Admit: 2016-01-11 | Discharge: 2016-01-11 | Disposition: A | Discharge status: Home / Self Care | Payer: Self-pay | Attending: Emergency Medicine | Admitting: Emergency Medicine

## 2016-01-11 ENCOUNTER — Emergency Department: Payer: Self-pay

## 2016-01-11 ENCOUNTER — Other Ambulatory Visit: Payer: Self-pay

## 2016-01-11 DIAGNOSIS — Z79899 Other long term (current) drug therapy: Secondary | ICD-10-CM | POA: Insufficient documentation

## 2016-01-11 DIAGNOSIS — R51 Headache: Secondary | ICD-10-CM | POA: Insufficient documentation

## 2016-01-11 DIAGNOSIS — I10 Essential (primary) hypertension: Secondary | ICD-10-CM | POA: Insufficient documentation

## 2016-01-11 DIAGNOSIS — R55 Syncope and collapse: Secondary | ICD-10-CM

## 2016-01-11 DIAGNOSIS — R110 Nausea: Secondary | ICD-10-CM | POA: Insufficient documentation

## 2016-01-11 DIAGNOSIS — R42 Dizziness and giddiness: Secondary | ICD-10-CM | POA: Insufficient documentation

## 2016-01-11 DIAGNOSIS — R519 Headache, unspecified: Secondary | ICD-10-CM

## 2016-01-11 LAB — URINALYSIS COMPLETE WITH MICROSCOPIC (ARMC ONLY)
BACTERIA UA: NONE SEEN
Bilirubin Urine: NEGATIVE
Glucose, UA: NEGATIVE mg/dL
HGB URINE DIPSTICK: NEGATIVE
Ketones, ur: NEGATIVE mg/dL
NITRITE: NEGATIVE
PROTEIN: NEGATIVE mg/dL
SPECIFIC GRAVITY, URINE: 1.017 (ref 1.005–1.030)
pH: 5 (ref 5.0–8.0)

## 2016-01-11 LAB — CBC
HEMATOCRIT: 41.2 % (ref 35.0–47.0)
HEMOGLOBIN: 14.5 g/dL (ref 12.0–16.0)
MCH: 30.2 pg (ref 26.0–34.0)
MCHC: 35.1 g/dL (ref 32.0–36.0)
MCV: 86.1 fL (ref 80.0–100.0)
Platelets: 270 10*3/uL (ref 150–440)
RBC: 4.78 MIL/uL (ref 3.80–5.20)
RDW: 13.1 % (ref 11.5–14.5)
WBC: 7.1 10*3/uL (ref 3.6–11.0)

## 2016-01-11 LAB — BASIC METABOLIC PANEL
ANION GAP: 8 (ref 5–15)
BUN: 24 mg/dL — ABNORMAL HIGH (ref 6–20)
CALCIUM: 10.1 mg/dL (ref 8.9–10.3)
CHLORIDE: 104 mmol/L (ref 101–111)
CO2: 27 mmol/L (ref 22–32)
Creatinine, Ser: 0.94 mg/dL (ref 0.44–1.00)
GFR calc Af Amer: >60 mL/min (ref >60)
GFR calc non Af Amer: >60 mL/min (ref >60)
GLUCOSE: 97 mg/dL (ref 65–99)
POTASSIUM: 3.6 mmol/L (ref 3.5–5.1)
Sodium: 139 mmol/L (ref 135–145)

## 2016-01-11 LAB — FIBRIN DERIVATIVES D-DIMER (ARMC ONLY): FIBRIN DERIVATIVES D-DIMER (ARMC): 445 (ref 0–499)

## 2016-01-11 LAB — TROPONIN I: Troponin I: <0.03 ng/mL (ref <0.03)

## 2016-01-11 MED ORDER — SODIUM CHLORIDE 0.9 % IV BOLUS (SEPSIS)
1000.0000 mL | Freq: Once | INTRAVENOUS | Status: AC
Start: 2016-01-11 — End: 2016-01-11
  Administered 2016-01-11: 1000 mL via INTRAVENOUS

## 2016-01-11 MED ORDER — HYDROCHLOROTHIAZIDE 12.5 MG PO TABS: 12.5000 mg | ORAL_TABLET | Freq: Every day | ORAL | 1 refills | Status: DC

## 2016-01-11 MED ORDER — ACETAMINOPHEN 500 MG PO TABS
1000.0000 mg | ORAL_TABLET | Freq: Once | ORAL | Status: AC
  Administered 2016-01-11: 1000 mg via ORAL
  Filled 2016-01-11: qty 2

## 2016-01-11 NOTE — Discharge Instructions (Signed)

## 2016-01-11 NOTE — ED Triage Notes (Signed)
Pt arrives to triage via [facility] from home  Pt reports she was seen here yesterday for chest pain and this am when she awakened "I felt much better"  Pt reports riding with her husband on a trip that took several hours and after returning home she began feeling dizzy and had a near syncopal episode  Spouse reports that another person caught her before she fell

## 2016-01-11 NOTE — ED Provider Notes (Signed)
[facility] Emergency Department Provider Note  ____________________________________________  Time seen: Approximately 5:28 PM  I have reviewed the triage vital signs and the nursing notes.   HISTORY  Chief Complaint Dizziness and Near Syncope   HPI Lauren Leblanc is a 65 y.o. female the history of hypertension and GERD who presents for evaluation of near syncopal episode. Patient reports that she was stared on lisinopril 4 days ago by her PCP and since then has felt very weak and fatigue, had an episode of CP for which she was seen here yesterday and today had near syncope. She has also had a dry cough that started 24hrs after starting the lisinopril. 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 Around 3 PM she was walking in the winery when she became very diaphoretic and had an episode of lightheadedness and almost passed out. This episode was associated with nausea and moderate frontal HA. Somebody who works at [employer] her and she did not sustain any injuries. She denies palpitations, chest pain, shortness of breath both preceding or after the near-syncopal episode. She denies facial droop, unilateral weakness or numbness, slurred speech, difficulty finding words. She has never had anything like this before. She was seen here yesterday for right-sided chest pain and has an appointment tomorrow with cardiology at 8:30 AM. Patient denies personal or family history of ischemic heart disease. Her mother did have a DVT in the past. Patient with no history of blood clots, no hemoptysis, no leg pain or swelling, no exogenous hormones, no recent travel or immobilization. She has no chest pain or shortness of breath, no abdominal pain or back pain. She reports right now she is back to her baseline.   Past Medical History:  Diagnosis Date  . GERD (gastroesophageal reflux disease)     . Hypertension     Patient Active Problem List   Diagnosis Date Noted  . Essential hypertension 01/06/2016    Past Surgical History:  Procedure Laterality Date  . BACK SURGERY    . TUBAL LIGATION      Prior to Admission medications   Medication Sig Start Date End Date Taking? Authorizing Provider  glucosamine-chondroitin 500-400 MG tablet Take 1 tablet by mouth 3 (three) times daily.    Historical Provider, MD  hydrochlorothiazide (HYDRODIURIL) 12.5 MG tablet Take 1 tablet (12.5 mg total) by mouth daily. 01/11/16   Nita Sickle, MD  pantoprazole (PROTONIX) 40 MG tablet Take 1 tablet (40 mg total) by mouth daily. Patient not taking: Reported on 01/06/2016 06/21/15   Duanne Limerick, MD    Allergies Codeine  No family history on file.  Social History Social History  Substance Use Topics  . Smoking status: Never Smoker  . Smokeless tobacco: Never Used  . Alcohol use No    Review of Systems  Constitutional: Negative for fever. + dizziness Eyes: Negative for visual changes. ENT: Negative for sore throat. Cardiovascular: Negative for chest pain. + diaphoresis Respiratory: Negative for shortness of breath. Gastrointestinal: Negative for abdominal pain, vomiting or diarrhea. + nausea Genitourinary: Negative for dysuria. Musculoskeletal: Negative for back pain. Skin: Negative for rash. Neurological: Negative for headaches, weakness or numbness.  ____________________________________________   PHYSICAL EXAM:  VITAL SIGNS: ED Triage Vitals  Enc Vitals Group     BP 01/11/16 1659 (!) 156/69     Pulse Rate 01/11/16 1659 60  Resp 01/11/16 1659 17     Temp 01/11/16 1659 97.9 F (36.6 C)     Temp Source 01/11/16 1659 Oral     SpO2 01/11/16 1659 99 %     Weight 01/11/16 1700 197 lb (89.4 kg)     Height 01/11/16 1700 5\' 5"  (1.651 m)     Head Circumference --      Peak Flow --      Pain Score 01/11/16 1700 0     Pain Loc --      Pain Edu? --      Excl. in GC? --      Constitutional: Alert and oriented. Well appearing and in no apparent distress. HEENT:      Head: Normocephalic and atraumatic.         Eyes: Conjunctivae are normal. Sclera is non-icteric. EOMI. PERRL      Mouth/Throat: Mucous membranes are moist.       Neck: Supple with no signs of meningismus. Cardiovascular: Regular rate and rhythm. No murmurs, gallops, or rubs. 2+ symmetrical distal pulses are present in all extremities. No JVD. Respiratory: Normal respiratory effort. Lungs are clear to auscultation bilaterally. No wheezes, crackles, or rhonchi.  Gastrointestinal: Soft, non tender, and non distended with positive bowel sounds. No rebound or guarding. Genitourinary: No CVA tenderness. Musculoskeletal: Nontender with normal range of motion in all extremities. No edema, cyanosis, or erythema of extremities. Neurologic: Normal speech and language. A & O x3, PERRL, no nystagmus, CN II-XII intact, motor testing reveals good tone and bulk throughout. There is no evidence of pronator drift or dysmetria. Muscle strength is 5/5 throughout. Deep tendon reflexes are 2+ throughout with downgoing toes. Sensory examination is intact. Gait is normal. Skin: Skin is warm, dry and intact. No rash noted. Psychiatric: Mood and affect are normal. Speech and behavior are normal.  ____________________________________________   LABS (all labs ordered are listed, but only abnormal results are displayed)  Labs Reviewed  BASIC METABOLIC PANEL - Abnormal; Notable for the following:       Result Value   BUN 24 (*)    All other components within normal limits  URINALYSIS COMPLETEWITH MICROSCOPIC ([facility] ONLY) - Abnormal; Notable for the following:    Color, Urine YELLOW (*)    APPearance CLEAR (*)    Leukocytes, UA TRACE (*)    Squamous Epithelial / LPF 0-5 (*)    All other components within normal limits  CBC  TROPONIN I  FIBRIN DERIVATIVES D-DIMER ([facility] ONLY)  TROPONIN I  CBG MONITORING, ED    ____________________________________________  EKG  ED ECG REPORT I, Nita Sicklearolina Joren Rehm, the attending physician, personally viewed and interpreted this ECG.  17:04 -normal sinus rhythm, rate of 63, normal intervals, normal axis, no ST elevations or depressions.  18:21 - normal sinus rhythm, rate of 57, normal intervals, normal axis, no ST elevations or depressions.  ____________________________________________  RADIOLOGY  Head CT: No significant intracranial abnormality is identified to account for the patient's syncope and headache  CXR: Normal chest x-ray. ____________________________________________   PROCEDURES  Procedure(s) performed: None Procedures Critical Care performed:  None ____________________________________________   INITIAL IMPRESSION / ASSESSMENT AND PLAN / ED COURSE  65 y.o. female the history of hypertension and GERD recently started on lisinopril who presents for evaluation of near syncopal episode preceded by lightheadedness, diaphoresis and nausea and associated with a headache that happened at 3 PM. Patient here yesterday with chest pain that resolved with nitroglycerin but essentially negative workup. Has an appointment  with cardiology tomorrow morning at 8:30am. Patient is currently back to her baseline, neurologically intact, physical exam is unremarkable. Initial EKG with no evidence of ischemia or arrhythmia. We'll observe patient on telemetry for any signs of arrhythmia. We'll check blood work, trend cardiac enzymes 2 every 3 hours, check a d-dimer. Will give IVF for possible dehydration and check orthostatic VS.  Clinical Course  Comment By Time  Patient remained stable with no further episodes of dizziness. Watch on telemetry for almost 5 hours with no arrhythmia. Troponin 2 is negative. D-dimer is negative. Remaining of her labs are within normal limits. Creatinine yesterday was 0.6 and today 0.9 could be from mild dehydration. Patient received  a liter of normal saline. Head CT and chest x-ray within normal limits. Patient has an appointment with cardiology tomorrow at 8:30 AM. We'll discharge her home at this time. Recommended the patient stop taking lisinopril due to all the side effects she is experiencing. She will continue her hydrochlorothiazide if her blood pressure is elevated tomorrow at cardiology's appointment she will discuss that with the cardiologist for possible alternative antihypertensive. Nita Sickle, MD 09/19 2123    Pertinent labs & imaging results that were available during my care of the patient were reviewed by me and considered in my medical decision making (see chart for details).    ____________________________________________   FINAL CLINICAL IMPRESSION(S) / ED DIAGNOSES  Final diagnoses:  Near syncope  Acute nonintractable headache, unspecified headache type      NEW MEDICATIONS STARTED DURING THIS VISIT:  New Prescriptions   HYDROCHLOROTHIAZIDE (HYDRODIURIL) 12.5 MG TABLET    Take 1 tablet (12.5 mg total) by mouth daily.     Note:  This document was prepared using Dragon voice recognition software and may include unintentional dictation errors.    Nita Sickle, MD 01/11/16 2128

## 2016-01-11 NOTE — ED Notes (Signed)
Pt was assisted to the restroom. Nothing else was needed of staff

## 2016-01-11 NOTE — ED Notes (Signed)
Patient transported to CT 

## 2016-01-11 NOTE — ED Notes (Signed)
MD at bedside. PT reports "I started lisinopril on Friday. I started feeling dizzy and just not right Friday." pt also has a cough that started on Friday.

## 2016-01-11 NOTE — ED Notes (Signed)
Pt back from ct

## 2016-01-12 ENCOUNTER — Encounter: Payer: Self-pay | Admitting: Internal Medicine

## 2016-01-12 ENCOUNTER — Ambulatory Visit (INDEPENDENT_AMBULATORY_CARE_PROVIDER_SITE_OTHER): Payer: Self-pay | Admitting: Internal Medicine

## 2016-01-12 VITALS — BP 130/76 | HR 60 | Ht 65.5 in | Wt 194.5 lb

## 2016-01-12 DIAGNOSIS — R55 Syncope and collapse: Secondary | ICD-10-CM

## 2016-01-12 DIAGNOSIS — R0789 Other chest pain: Secondary | ICD-10-CM

## 2016-01-12 DIAGNOSIS — I10 Essential (primary) hypertension: Secondary | ICD-10-CM

## 2016-01-12 NOTE — Patient Instructions (Signed)
Follow-Up: Your physician recommends that you schedule a follow-up appointment in: 3 months with Dr. End.  It was a pleasure seeing you today here in the office. Please do not hesitate to give us a call back if you have any further questions. [phone]  Pamela A. RN, BSN     

## 2016-01-12 NOTE — Progress Notes (Signed)
New Outpatient Visit Date: 01/12/2016  Primary Care Provider: Duanne Limerickeanna C Jones, MD [address] [place], [place] [id number]  Chief Complaint:  Chief Complaint  Patient presents with  . other    [facility]; chest pain. Meds reviewed by the pt. verbally. "doing well."     HPI:  [name] is a 65 y.o. year-old female with history of hypertension and GERD, who has been referred by [facility] Lenard [facility]uchowski [facility] for evaluation of chest pain. [facility]16 with right-sided chest pain radiating to the neck. The following day, she had a near syncopal episode and returned to the ED for further evaluation. The symptoms began after she was started on lisinopril by her PCP last week. Almost immediately after beginning the medication, [name] noticed shortness of breath, severe cough, and generalized fatigue. Two days ago, she awoke early in the morning with right-sided chest pain radiating to the right arm and neck. She took a baby aspirin without improvement in her pain, prompting her to go to a nearby fire station for evaluation. She was subsequently [facility] where further workup was unrevealing. She describes her chest pain as 8/10 in maximal intensity pressure or heaviness in the right side of her chest. The discomfort gradually resolved on its own over the course of about 24 hours. After being discharged from the ED, the patient continued to take lisinopril-HCTZ and subsequently experienced a near syncopal episode yesterday afternoon. At the time, she felt disoriented, nauseated, and very sweaty but did not have palpitations. She also noted a headache with accompanying tingling along her scalp. [facility], where workup was unrevealing, including head CT that showed no significant abnormalities. The patient denies face and tongue swelling.  Today, the patient reports feeling back to normal. She no longer has chest  pain, lightheadedness, and dizziness. Of note, she last took lisinopril yesterday morning. Prior to beginning lisinopril, the patient had not experienced chest pain, shortness of breath, palpitations, and lightheadedness with strenuous activities. At this time, the patient is taking only HCTZ 12.5 mg daily. She is scheduled to follow-up with her PCP next month for reevaluation of her blood pressure as well as a lipid panel. The patient denies a history of cardiac disease and prior cardiovascular evaluation.  --------------------------------------------------------------------------------------------------  Past Medical History:  Diagnosis Date  . GERD (gastroesophageal reflux disease)   . Hypertension     Past Surgical History:  Procedure Laterality Date  . BACK SURGERY    . TUBAL LIGATION      Outpatient Encounter Prescriptions as of 01/12/2016  Medication Sig  . glucosamine-chondroitin 500-400 MG tablet Take 1 tablet by mouth 3 (three) times daily.  . hydrochlorothiazide (MICROZIDE) 12.5 MG capsule Take 12.5 mg by mouth daily.  . [DISCONTINUED] hydrochlorothiazide (HYDRODIURIL) 12.5 MG tablet Take 1 tablet (12.5 mg total) by mouth daily. (Patient not taking: Reported on 01/12/2016)  . [DISCONTINUED] pantoprazole (PROTONIX) 40 MG tablet Take 1 tablet (40 mg total) by mouth daily. (Patient not taking: Reported on 01/12/2016)   No facility-administered encounter medications on file as of 01/12/2016.     Allergies: Lisinopril and Codeine  Social History   Social History  . Marital status: Married    Spouse name: N/A  . Number of children: N/A  . Years of education: N/A   Occupational History  . Not on file.   Social History Main Topics  . Smoking status: Never  Smoker  . Smokeless tobacco: Never Used  . Alcohol use No  . Drug use: No  . Sexual activity: Not Currently   Other Topics Concern  . Not on file   Social History Narrative  . No narrative on file    Family  History  Problem Relation Age of Onset  . Coronary artery disease Mother 73  . Hypertension Mother   . Stroke Father   . Diabetes Father   . Hypertension Sister   . Allergies Sister     Review of Systems: Occasional left leg pain and prominent vein when standing.  Improves with compression stocking use.  Mild headache this morning.  A 12-system review of systems was performed and was negative except as noted in the HPI.  --------------------------------------------------------------------------------------------------  Physical Exam: BP 130/76 (BP Location: Left Arm, Patient Position: Sitting, Cuff Size: Normal)   Pulse 60   Ht 5' 5.5" (1.664 m)   Wt 194 lb 8 oz (88.2 kg)   BMI 31.87 kg/m   General: Overweight woman, seated comfortably in the exam room. HEENT: No conjunctival pallor or scleral icterus.  Moist mucous membranes.  OP clear. Neck: Supple without lymphadenopathy, thyromegaly, JVD, or HJR.  No carotid bruit. Lungs: Normal work of breathing.  Clear to auscultation bilaterally without wheezes or crackles. CV: Regular rate and rhythm without murmurs, rubs, or gallops.  Non-displaced PMI. Abd: Bowel sounds present.  Soft, NT/ND without hepatosplenomegaly Ext: No lower extremity edema.  Radial, PT, and DP pulses are 2+ bilaterally Skin: warm and dry without rash Neuro: CNIII-XII intact.  Strength and fine-touch sensation intact in upper and lower extremities bilaterally. Psych: Normal mood and affect.  EKG (01/11/16):  NSR without significant abnormalities.  CXR (01/11/16): I have personally reviewed the images. No significant abnormality with normal-appearing lungs and cardiomediastinal silhouette.  Lab Results  Component Value Date   WBC 7.1 01/11/2016   HGB 14.5 01/11/2016   HCT 41.2 01/11/2016   MCV 86.1 01/11/2016   PLT 270 01/11/2016    Lab Results  Component Value Date   NA 139 01/11/2016   K 3.6 01/11/2016   CL 104 01/11/2016   CO2 27 01/11/2016   BUN  24 (H) 01/11/2016   CREATININE 0.94 01/11/2016   GLUCOSE 97 01/11/2016   ALT 15 01/10/2016    --------------------------------------------------------------------------------------------------  ASSESSMENT AND PLAN: 1. Chest pain The patient's chest pain is atypical and has only occurred once in the setting of a likely reaction to lisinopril. Her EKG today is normal. She is typically very active and has not had any exertional chest pain or dyspnea. Overall, she is low risk for obstructive coronary artery disease based on the nature of her pain and lack of exertional symptoms. Hypertension and family history her chief cardiovascular risk factors. We discussed the risks and benefits of noninvasive ischemia evaluation with an exercise treadmill stress test (+/- imaging), and have agreed to hold off at this time. If the patient has any recurrence of chest pain or dyspnea, or notices progressive decline in her energy, we will proceed with the stress test. I encouraged the patient to follow-up with her PCP next month, to include a lipid panel for further risk stratification. Patient was advised to seek immediate medical attention should her chest pain, shortness of breath, or dizziness recur.  2. Near syncope I suspect that the patient's dizziness was due to a reaction to lisinopril. This has resolved with discontinuation of the medication. She did not experience palpitations, chest pain, or  dyspnea at the time of her dizziness to suggest a primary cardiac etiology. We will defer further workup at this time. If it should recur, the patient would benefit from the aforementioned stress test as well as a transthoracic echocardiogram.  3. Essential hypertension Blood pressure is normal today. I have added lisinopril to the patient's list of allergies. Though unusual, chest pain, dyspnea, cough, and dizziness have been associated with angioedema. Therefore, ace inhibitors (and ideally ARBs) should be avoided in  the future. We will continue with HCTZ 12.5 mg daily. The patient will follow-up with her PCP next month. If her blood pressure is suboptimally controlled at that time, addition of amlodipine may be reasonable.  Follow-up: Return to clinic in 3 months.  Yvonne Kendall, MD 01/12/2016 12:24 PM

## 2016-02-03 ENCOUNTER — Ambulatory Visit (INDEPENDENT_AMBULATORY_CARE_PROVIDER_SITE_OTHER): Payer: Self-pay | Admitting: Family Medicine

## 2016-02-03 ENCOUNTER — Encounter: Payer: Self-pay | Admitting: Family Medicine

## 2016-02-03 VITALS — BP 130/80 | HR 64 | Ht 65.0 in | Wt 192.0 lb

## 2016-02-03 DIAGNOSIS — I10 Essential (primary) hypertension: Secondary | ICD-10-CM

## 2016-02-03 DIAGNOSIS — E785 Hyperlipidemia, unspecified: Secondary | ICD-10-CM

## 2016-02-03 DIAGNOSIS — R55 Syncope and collapse: Secondary | ICD-10-CM

## 2016-02-03 NOTE — Progress Notes (Signed)
Name: Lauren Leblanc   MRN: [id number]    DOB: 07-09-1950   Date:02/03/2016       Progress Note  Subjective  Chief Complaint  Chief Complaint  Patient presents with  . Hyperlipidemia    recheck cholesterol levels    Hyperlipidemia  This is a new problem. She has no history of chronic renal disease, diabetes, hypothyroidism, liver disease, obesity or nephrotic syndrome. There are no known factors aggravating her hyperlipidemia. Pertinent negatives include no chest pain, focal sensory loss, focal weakness, leg pain, myalgias or shortness of breath. She is currently on no antihyperlipidemic treatment. Compliance problems include medication side effects.  There are no known risk factors for coronary artery disease.    No problem-specific Assessment & Plan notes found for this encounter.   Past Medical History:  Diagnosis Date  . GERD (gastroesophageal reflux disease)   . Hypertension     Past Surgical History:  Procedure Laterality Date  . BACK SURGERY    . TUBAL LIGATION      Family History  Problem Relation Age of Onset  . Coronary artery disease Mother [id number]  . Hypertension Mother   . Stroke Father   . Diabetes Father   . Hypertension Sister   . Allergies Sister     Social History   Social History  . Marital status: Married    Spouse name: N/A  . Number of children: N/A  . Years of education: N/A   Occupational History  . Not on file.   Social History Main Topics  . Smoking status: Never Smoker  . Smokeless tobacco: Never Used  . Alcohol use No  . Drug use: No  . Sexual activity: Not Currently   Other Topics Concern  . Not on file   Social History Narrative  . No narrative on file    Allergies  Allergen Reactions  . Lisinopril Other (See Comments)    Chest pain, dizziness, cough  . Codeine     "hair crawling"     Review of Systems  Constitutional: Negative for chills, fever, malaise/fatigue and weight loss.  HENT: Negative for ear  discharge, ear pain and sore throat.   Eyes: Negative for blurred vision.  Respiratory: Negative for cough, sputum production, shortness of breath and wheezing.   Cardiovascular: Negative for chest pain, palpitations and leg swelling.  Gastrointestinal: Negative for abdominal pain, blood in stool, constipation, diarrhea, heartburn, melena and nausea.  Genitourinary: Negative for dysuria, frequency, hematuria and urgency.  Musculoskeletal: Negative for back pain, joint pain, myalgias and neck pain.  Skin: Negative for rash.  Neurological: Negative for dizziness, tingling, sensory change, focal weakness and headaches.  Endo/Heme/Allergies: Negative for environmental allergies and polydipsia. Does not bruise/bleed easily.  Psychiatric/Behavioral: Negative for depression and suicidal ideas. The patient is not nervous/anxious and does not have insomnia.      Objective  Vitals:   02/03/16 0805  BP: 130/80  Pulse: 64  Weight: 192 lb (87.1 kg)  Height: 5\' 5"  (1.651 m)    Physical Exam  Constitutional: She is well-developed, well-nourished, and in no distress. No distress.  HENT:  Head: Normocephalic and atraumatic.  Right Ear: External ear normal.  Left Ear: External ear normal.  Nose: Nose normal.  Mouth/Throat: Oropharynx is clear and moist.  Eyes: Conjunctivae and EOM are normal. Pupils are equal, round, and reactive to light. Right eye exhibits no discharge. Left eye exhibits no discharge.  Neck: Normal range of motion. Neck supple. No JVD present. No thyromegaly  present.  Cardiovascular: Normal rate, regular rhythm, normal heart sounds and intact distal pulses.  Exam reveals no gallop and no friction rub.   No murmur heard. Pulmonary/Chest: Effort normal and breath sounds normal.  Abdominal: Soft. Bowel sounds are normal. She exhibits no mass. There is no tenderness. There is no guarding.  Musculoskeletal: Normal range of motion. She exhibits no edema.  Lymphadenopathy:    She  has no cervical adenopathy.  Neurological: She is alert.  Skin: Skin is warm and dry. She is not diaphoretic.  Psychiatric: Mood and affect normal.      Assessment & Plan  Problem List Items Addressed This Visit      Cardiovascular and Mediastinum   Essential hypertension - Primary    Other Visit Diagnoses    Hyperlipidemia, unspecified hyperlipidemia type       Relevant Orders   Lipid Profile   Near syncope       resolving        Dr. Elizabeth Sauer [facility]  02/03/16

## 2016-02-03 NOTE — Patient Instructions (Signed)

## 2016-02-04 LAB — LIPID PANEL
Chol/HDL Ratio: 4.2 ratio units (ref 0.0–4.4)
Cholesterol, Total: 178 mg/dL (ref 100–199)
HDL: 42 mg/dL (ref >39)
LDL Calculated: 111 mg/dL — ABNORMAL HIGH (ref 0–99)
Triglycerides: 124 mg/dL (ref 0–149)
VLDL Cholesterol Cal: 25 mg/dL (ref 5–40)

## 2016-03-29 ENCOUNTER — Ambulatory Visit: Payer: Self-pay | Admitting: Internal Medicine

## 2017-02-01 ENCOUNTER — Encounter: Payer: Self-pay | Admitting: Family Medicine

## 2017-02-01 ENCOUNTER — Ambulatory Visit (INDEPENDENT_AMBULATORY_CARE_PROVIDER_SITE_OTHER): Payer: Self-pay | Admitting: Family Medicine

## 2017-02-01 VITALS — BP 144/100 | HR 76 | Ht 65.0 in | Wt 200.0 lb

## 2017-02-01 DIAGNOSIS — Z23 Encounter for immunization: Secondary | ICD-10-CM

## 2017-02-01 DIAGNOSIS — I10 Essential (primary) hypertension: Secondary | ICD-10-CM

## 2017-02-01 DIAGNOSIS — L089 Local infection of the skin and subcutaneous tissue, unspecified: Secondary | ICD-10-CM

## 2017-02-01 DIAGNOSIS — R079 Chest pain, unspecified: Secondary | ICD-10-CM

## 2017-02-01 DIAGNOSIS — M67449 Ganglion, unspecified hand: Secondary | ICD-10-CM

## 2017-02-01 MED ORDER — HYDROCHLOROTHIAZIDE 12.5 MG PO TABS: 12.5000 mg | ORAL_TABLET | Freq: Every day | ORAL | 3 refills | Status: DC

## 2017-02-01 MED ORDER — SULFAMETHOXAZOLE-TRIMETHOPRIM 800-160 MG PO TABS: 1.0000 | ORAL_TABLET | Freq: Two times a day (BID) | ORAL | 0 refills | Status: DC

## 2017-02-01 NOTE — Progress Notes (Signed)
Name: Lauren Leblanc   MRN: [id number]    DOB: 04/17/1951   Date:02/01/2017       Progress Note  Subjective  Chief Complaint  Chief Complaint  Patient presents with  . Hand Pain    Has cut off place on R) index finger several times- it comes back and it is "so painful"  . Hypertension    pt stopped B/P med- said "I was told to stop it in December"- notes indicate that cardio wanted her to continue med and follow up in 3 months?    Hand Pain   The incident occurred more than 1 week ago. There was no injury mechanism. The pain is present in the right fingers (index). The quality of the pain is described as aching. The pain radiates to the left hand. The pain is moderate. The pain has been intermittent since the incident. Pertinent negatives include no chest pain, muscle weakness, numbness or tingling. The symptoms are aggravated by movement. Treatments tried: self surgery. The treatment provided no relief.  Hypertension  This is a chronic problem. The current episode started more than 1 year ago. The problem has been waxing and waning since onset. The problem is controlled. Pertinent negatives include no anxiety, blurred vision, chest pain, headaches, malaise/fatigue, neck pain, orthopnea, palpitations, peripheral edema, PND, shortness of breath or sweats. There are no associated agents to hypertension. Risk factors for coronary artery disease include dyslipidemia and post-menopausal state. Past treatments include diuretics. The current treatment provides moderate improvement. There are no compliance problems.  There is no history of angina, kidney disease, CAD/MI, CVA, heart failure, left ventricular hypertrophy, PVD or retinopathy. There is no history of chronic renal disease, a hypertension causing med or renovascular disease.  Chest Pain   This is a recurrent problem. The current episode started more than 1 month ago. The onset quality is undetermined. The problem occurs intermittently.  The problem has been gradually worsening. The pain is present in the substernal region. The pain is moderate. The quality of the pain is described as tightness. The pain does not radiate. Associated symptoms include exertional chest pressure. Pertinent negatives include no abdominal pain, back pain, claudication, cough, diaphoresis, dizziness, fever, headaches, irregular heartbeat, leg pain, malaise/fatigue, nausea, near-syncope, numbness, orthopnea, palpitations, PND, shortness of breath, sputum production or vomiting. Associated symptoms comments: mowing. The treatment provided mild relief.  Her past medical history is significant for hypertension.  Pertinent negatives for past medical history include no PVD.    No problem-specific Assessment & Plan notes found for this encounter.   Past Medical History:  Diagnosis Date  . GERD (gastroesophageal reflux disease)   . Hypertension     Past Surgical History:  Procedure Laterality Date  . BACK SURGERY    . TUBAL LIGATION      Family History  Problem Relation Age of Onset  . Coronary artery disease Mother 33  . Hypertension Mother   . Stroke Father   . Diabetes Father   . Hypertension Sister   . Allergies Sister     Social History   Social History  . Marital status: Married    Spouse name: N/A  . Number of children: N/A  . Years of education: N/A   Occupational History  . Not on file.   Social History Main Topics  . Smoking status: Never Smoker  . Smokeless tobacco: Never Used  . Alcohol use No  . Drug use: No  . Sexual activity: Not Currently   Other  Topics Concern  . Not on file   Social History Narrative  . No narrative on file    Allergies  Allergen Reactions  . Lisinopril Other (See Comments)    Chest pain, dizziness, cough  . Codeine     "hair crawling"    Outpatient Medications Prior to Visit  Medication Sig Dispense Refill  . glucosamine-chondroitin 500-400 MG tablet Take 1 tablet by mouth 3  (three) times daily.    . hydrochlorothiazide (MICROZIDE) 12.5 MG capsule Take 12.5 mg by mouth daily.    . Multiple Vitamin (MULTIVITAMIN) tablet Take 1 tablet by mouth daily.     No facility-administered medications prior to visit.     Review of Systems  Constitutional: Negative for chills, diaphoresis, fever, malaise/fatigue and weight loss.  HENT: Negative for ear discharge, ear pain and sore throat.   Eyes: Negative for blurred vision.  Respiratory: Negative for cough, sputum production, shortness of breath and wheezing.   Cardiovascular: Negative for chest pain, palpitations, orthopnea, claudication, leg swelling, PND and near-syncope.  Gastrointestinal: Negative for abdominal pain, blood in stool, constipation, diarrhea, heartburn, melena, nausea and vomiting.  Genitourinary: Negative for dysuria, frequency, hematuria and urgency.  Musculoskeletal: Negative for back pain, joint pain, myalgias and neck pain.  Skin: Negative for rash.  Neurological: Negative for dizziness, tingling, sensory change, focal weakness, numbness and headaches.  Endo/Heme/Allergies: Negative for environmental allergies and polydipsia. Does not bruise/bleed easily.  Psychiatric/Behavioral: Negative for depression and suicidal ideas. The patient is not nervous/anxious and does not have insomnia.      Objective  Vitals:   02/01/17 0819  BP: (!) 144/100  Pulse: 76  Weight: 200 lb (90.7 kg)  Height:  (1.651 m)    Physical Exam  Constitutional: She is well-developed, well-nourished, and in no distress. No distress.  HENT:  Head: Normocephalic and atraumatic.  Right Ear: External ear normal.  Left Ear: External ear normal.  Nose: Nose normal.  Mouth/Throat: Oropharynx is clear and moist.  Eyes: Pupils are equal, round, and reactive to light. Conjunctivae and EOM are normal. Right eye exhibits no discharge. Left eye exhibits no discharge.  Neck: Normal range of motion. Neck supple. No JVD  present. No thyromegaly present.  Cardiovascular: Normal rate, regular rhythm, normal heart sounds and intact distal pulses.  Exam reveals no gallop and no friction rub.   No murmur heard. Pulmonary/Chest: Effort normal and breath sounds normal. She has no wheezes. She has no rales.  Abdominal: Soft. Bowel sounds are normal. She exhibits no mass. There is no tenderness. There is no guarding.  Musculoskeletal: Normal range of motion. She exhibits no edema.  Lymphadenopathy:    She has no cervical adenopathy.  Neurological: She is alert. She has normal reflexes.  Skin: Skin is warm and dry. She is not diaphoretic.  Psychiatric: Mood and affect normal.  Nursing note and vitals reviewed.     Assessment & Plan  Problem List Items Addressed This Visit      Cardiovascular and Mediastinum   Essential hypertension   Relevant Medications   hydrochlorothiazide (HYDRODIURIL) 12.5 MG tablet    Other Visit Diagnoses    Infected finger    -  Primary   Relevant Medications   sulfamethoxazole-trimethoprim (BACTRIM DS,SEPTRA DS) 800-160 MG tablet   Chest pain, unspecified type       Digital mucinous cyst       Relevant Orders   Ambulatory referral to Dermatology   Influenza vaccine needed  Relevant Orders   Flu Vaccine QUAD 6+ mos PF IM (Fluarix Quad PF) (Completed)      Meds ordered this encounter  Medications  . sulfamethoxazole-trimethoprim (BACTRIM DS,SEPTRA DS) 800-160 MG tablet    Sig: Take 1 tablet by mouth 2 (two) times daily.    Dispense:  20 tablet    Refill:  0  . hydrochlorothiazide (HYDRODIURIL) 12.5 MG tablet    Sig: Take 1 tablet (12.5 mg total) by mouth daily.    Dispense:  90 tablet    Refill:  3      Dr. Elizabeth Sauer [facility]  02/01/17

## 2017-02-02 ENCOUNTER — Telehealth: Payer: Self-pay

## 2017-02-02 MED ORDER — PROPRANOLOL HCL 20 MG PO TABS
20.0000 mg | ORAL_TABLET | Freq: Every day | ORAL | 0 refills | Status: DC
Start: 2017-02-02 — End: 2017-02-13

## 2017-02-02 NOTE — Telephone Encounter (Signed)
Pt called stating that she took the HCTZ med this am and feels "jittery"- switched over to Propranolol  qday #20 sent in to check b/p and how she is doing on med on day 18

## 2017-02-13 ENCOUNTER — Encounter: Payer: Self-pay | Admitting: Family Medicine

## 2017-02-13 ENCOUNTER — Other Ambulatory Visit: Payer: Self-pay

## 2017-02-13 ENCOUNTER — Ambulatory Visit (INDEPENDENT_AMBULATORY_CARE_PROVIDER_SITE_OTHER): Payer: Self-pay | Admitting: Family Medicine

## 2017-02-13 VITALS — BP 124/80 | HR 80 | Ht 65.0 in | Wt 199.0 lb

## 2017-02-13 DIAGNOSIS — Z1231 Encounter for screening mammogram for malignant neoplasm of breast: Secondary | ICD-10-CM

## 2017-02-13 DIAGNOSIS — I10 Essential (primary) hypertension: Secondary | ICD-10-CM

## 2017-02-13 DIAGNOSIS — Z1239 Encounter for other screening for malignant neoplasm of breast: Secondary | ICD-10-CM

## 2017-02-13 MED ORDER — PROPRANOLOL HCL 20 MG PO TABS: 20.0000 mg | ORAL_TABLET | Freq: Every morning | ORAL | 3 refills | Status: DC

## 2017-02-13 NOTE — Progress Notes (Signed)
Name: Lauren BarrierDebra L Leblanc   MRN: [id number]    DOB: December 02, 1950   Date:02/13/2017       Progress Note  Subjective  Chief Complaint  Chief Complaint  Patient presents with  . Hypertension    started Propranolol x 11 days ago due to change in med- feeling much better on this med    Hypertension  This is a chronic problem. The current episode started more than 1 year ago. The problem has been gradually improving since onset. The problem is controlled. Pertinent negatives include no anxiety, blurred vision, chest pain, headaches, malaise/fatigue, neck pain, orthopnea, palpitations, peripheral edema, PND, shortness of breath or sweats. There are no associated agents to hypertension. Past treatments include beta blockers. There are no compliance problems.  There is no history of angina, kidney disease, CAD/MI, CVA, heart failure, left ventricular hypertrophy, PVD or retinopathy. There is no history of chronic renal disease, a hypertension causing med or renovascular disease.    No problem-specific Assessment & Plan notes found for this encounter.   Past Medical History:  Diagnosis Date  . GERD (gastroesophageal reflux disease)   . Hypertension     Past Surgical History:  Procedure Laterality Date  . BACK SURGERY    . TUBAL LIGATION      Family History  Problem Relation Age of Onset  . Coronary artery disease Mother [id number]  . Hypertension Mother   . Stroke Father   . Diabetes Father   . Hypertension Sister   . Allergies Sister     Social History   Social History  . Marital status: Married    Spouse name: N/A  . Number of children: N/A  . Years of education: N/A   Occupational History  . Not on file.   Social History Main Topics  . Smoking status: Never Smoker  . Smokeless tobacco: Never Used  . Alcohol use No  . Drug use: No  . Sexual activity: Not Currently   Other Topics Concern  . Not on file   Social History Narrative  . No narrative on file    Allergies   Allergen Reactions  . Lisinopril Other (See Comments)    Chest pain, dizziness, cough  . Codeine     "hair crawling"  . Hctz [Hydrochlorothiazide]     Jittery feeling    Outpatient Medications Prior to Visit  Medication Sig Dispense Refill  . propranolol (INDERAL) 20 MG tablet Take 1 tablet (20 mg total) by mouth daily. 20 tablet 0  . sulfamethoxazole-trimethoprim (BACTRIM DS,SEPTRA DS) 800-160 MG tablet Take 1 tablet by mouth 2 (two) times daily. 20 tablet 0   No facility-administered medications prior to visit.     Review of Systems  Constitutional: Negative for chills, fever, malaise/fatigue and weight loss.  HENT: Negative for ear discharge, ear pain and sore throat.   Eyes: Negative for blurred vision.  Respiratory: Negative for cough, sputum production, shortness of breath and wheezing.   Cardiovascular: Negative for chest pain, palpitations, orthopnea, leg swelling and PND.  Gastrointestinal: Negative for abdominal pain, blood in stool, constipation, diarrhea, heartburn, melena and nausea.  Genitourinary: Negative for dysuria, frequency, hematuria and urgency.  Musculoskeletal: Negative for back pain, joint pain, myalgias and neck pain.  Skin: Negative for rash.  Neurological: Negative for dizziness, tingling, sensory change, focal weakness and headaches.  Endo/Heme/Allergies: Negative for environmental allergies and polydipsia. Does not bruise/bleed easily.  Psychiatric/Behavioral: Negative for depression and suicidal ideas. The patient is not nervous/anxious and does not have  insomnia.      Objective  Vitals:   02/13/17 1009  BP: 124/80  Pulse: 80  Weight: 199 lb (90.3 kg)  Height: 5\' 5"  (1.651 m)    Physical Exam  Constitutional: She is well-developed, well-nourished, and in no distress. No distress.  HENT:  Head: Normocephalic and atraumatic.  Right Ear: External ear normal.  Left Ear: External ear normal.  Nose: Nose normal.  Mouth/Throat: Oropharynx is  clear and moist.  Eyes: Pupils are equal, round, and reactive to light. Conjunctivae and EOM are normal. Right eye exhibits no discharge. Left eye exhibits no discharge.  Neck: Normal range of motion. Neck supple. No JVD present. No thyromegaly present.  Cardiovascular: Normal rate, regular rhythm, normal heart sounds and intact distal pulses.  Exam reveals no gallop and no friction rub.   No murmur heard. Pulmonary/Chest: Effort normal and breath sounds normal. She has no wheezes. She has no rales.  Abdominal: Soft. Bowel sounds are normal. She exhibits no mass. There is no tenderness. There is no guarding.  Musculoskeletal: Normal range of motion. She exhibits no edema.  Lymphadenopathy:    She has no cervical adenopathy.  Neurological: She is alert. She has normal reflexes.  Skin: Skin is warm and dry. She is not diaphoretic.  Psychiatric: Mood and affect normal.  Nursing note and vitals reviewed.     Assessment & Plan  Problem List Items Addressed This Visit      Cardiovascular and Mediastinum   Essential hypertension - Primary   Relevant Medications   propranolol (INDERAL) 20 MG tablet    Other Visit Diagnoses    Breast cancer screening       Relevant Orders   MM Digital Screening      Meds ordered this encounter  Medications  . propranolol (INDERAL) 20 MG tablet    Sig: Take 1 tablet (20 mg total) by mouth every morning.    Dispense:  90 tablet    Refill:  3    Her to start out on once a day dosing and come in on day 18 for b/p check. D/c HCTZ      Dr. Hayden [facility]  02/13/17

## 2017-03-01 ENCOUNTER — Ambulatory Visit: Payer: Self-pay

## 2017-03-06 ENCOUNTER — Ambulatory Visit
Admission: RE | Admit: 2017-03-06 | Discharge: 2017-03-06 | Disposition: A | Discharge status: Home / Self Care | Payer: Self-pay | Source: Ambulatory Visit | Attending: Family Medicine | Admitting: Family Medicine

## 2017-03-06 ENCOUNTER — Other Ambulatory Visit: Payer: Self-pay | Admitting: Family Medicine

## 2017-03-06 DIAGNOSIS — R928 Other abnormal and inconclusive findings on diagnostic imaging of breast: Secondary | ICD-10-CM | POA: Insufficient documentation

## 2017-03-06 DIAGNOSIS — Z1239 Encounter for other screening for malignant neoplasm of breast: Secondary | ICD-10-CM

## 2017-03-06 DIAGNOSIS — Z1231 Encounter for screening mammogram for malignant neoplasm of breast: Secondary | ICD-10-CM | POA: Insufficient documentation

## 2017-03-08 ENCOUNTER — Other Ambulatory Visit: Payer: Self-pay | Admitting: Family Medicine

## 2017-03-08 DIAGNOSIS — R928 Other abnormal and inconclusive findings on diagnostic imaging of breast: Secondary | ICD-10-CM

## 2017-03-08 DIAGNOSIS — N6320 Unspecified lump in the left breast, unspecified quadrant: Secondary | ICD-10-CM

## 2017-03-08 DIAGNOSIS — N6310 Unspecified lump in the right breast, unspecified quadrant: Secondary | ICD-10-CM

## 2017-03-22 ENCOUNTER — Ambulatory Visit
Admission: RE | Admit: 2017-03-22 | Discharge: 2017-03-22 | Disposition: A | Discharge status: Home / Self Care | Payer: Self-pay | Source: Ambulatory Visit | Attending: Family Medicine | Admitting: Family Medicine

## 2017-03-22 DIAGNOSIS — N6320 Unspecified lump in the left breast, unspecified quadrant: Secondary | ICD-10-CM

## 2017-03-22 DIAGNOSIS — N6310 Unspecified lump in the right breast, unspecified quadrant: Secondary | ICD-10-CM

## 2017-03-22 DIAGNOSIS — R928 Other abnormal and inconclusive findings on diagnostic imaging of breast: Secondary | ICD-10-CM

## 2017-03-22 DIAGNOSIS — N6001 Solitary cyst of right breast: Secondary | ICD-10-CM | POA: Insufficient documentation

## 2017-03-22 DIAGNOSIS — N6324 Unspecified lump in the left breast, lower inner quadrant: Secondary | ICD-10-CM | POA: Insufficient documentation

## 2017-03-27 ENCOUNTER — Ambulatory Visit (INDEPENDENT_AMBULATORY_CARE_PROVIDER_SITE_OTHER): Payer: Self-pay | Admitting: Family Medicine

## 2017-03-27 ENCOUNTER — Encounter: Payer: Self-pay | Admitting: Family Medicine

## 2017-03-27 VITALS — BP 128/80 | HR 64 | Ht 65.0 in | Wt 202.0 lb

## 2017-03-27 DIAGNOSIS — E7800 Pure hypercholesterolemia, unspecified: Secondary | ICD-10-CM

## 2017-03-27 DIAGNOSIS — E6609 Other obesity due to excess calories: Secondary | ICD-10-CM

## 2017-03-27 DIAGNOSIS — R079 Chest pain, unspecified: Secondary | ICD-10-CM

## 2017-03-27 DIAGNOSIS — I10 Essential (primary) hypertension: Secondary | ICD-10-CM

## 2017-03-27 DIAGNOSIS — Z6833 Body mass index (BMI) 33.0-33.9, adult: Secondary | ICD-10-CM

## 2017-03-27 DIAGNOSIS — E66811 Obesity, class 1: Secondary | ICD-10-CM

## 2017-03-27 MED ORDER — ASPIRIN 325 MG PO TABS: 325.0000 mg | ORAL_TABLET | ORAL | Status: DC | PRN

## 2017-03-27 NOTE — Progress Notes (Signed)
Name: Lauren BarrierDebra L Konieczny   MRN: [id number]    DOB: 09-Jun-1950   Date:03/27/2017       Progress Note  Subjective  Chief Complaint  Chief Complaint  Patient presents with  . Hypertension    follow up on starting Propranolol  . Arthritis    R) ankle, knees and back hurting- gets worse when wet and cold weather    Hypertension  This is a chronic problem. The current episode started more than 1 year ago. The problem has been gradually worsening since onset. The problem is controlled. Associated symptoms include chest pain, palpitations and shortness of breath. Pertinent negatives include no anxiety, blurred vision, headaches, malaise/fatigue, neck pain, orthopnea, peripheral edema, PND or sweats. There are no associated agents to hypertension. Risk factors for coronary artery disease include dyslipidemia and obesity. Past treatments include beta blockers. The current treatment provides moderate improvement.  Arthritis  Pertinent negatives include no diarrhea, dysuria, fever, rash or weight loss.  Chest Pain   This is a recurrent problem. The current episode started more than 1 year ago. The problem occurs intermittently. The problem has been waxing and waning. The pain is present in the substernal region. The pain is at a severity of 8/10. The pain is moderate. The quality of the pain is described as heavy (something sitting on my chest). The pain does not radiate. Associated symptoms include diaphoresis, exertional chest pressure, palpitations and shortness of breath. Pertinent negatives include no abdominal pain, back pain, cough, dizziness, fever, headaches, malaise/fatigue, nausea, orthopnea, PND or sputum production. Risk factors include obesity.  Her past medical history is significant for hypertension. Prior workup: EKG.    No problem-specific Assessment & Plan notes found for this encounter.   Past Medical History:  Diagnosis Date  . GERD (gastroesophageal reflux disease)   .  Hypertension     Past Surgical History:  Procedure Laterality Date  . BACK SURGERY    . BREAST BIOPSY Bilateral    neg  . TUBAL LIGATION      Family History  Problem Relation Age of Onset  . Coronary artery disease Mother 1959  . Hypertension Mother   . Stroke Father   . Diabetes Father   . Hypertension Sister   . Allergies Sister   . Breast cancer Maternal Aunt        3 mat aunts    Social History   Socioeconomic History  . Marital status: Married    Spouse name: Not on file  . Number of children: Not on file  . Years of education: Not on file  . Highest education level: Not on file  Social Needs  . Financial resource strain: Not on file  . Food insecurity - worry: Not on file  . Food insecurity - inability: Not on file  . Transportation needs - medical: Not on file  . Transportation needs - non-medical: Not on file  Occupational History  . Not on file  Tobacco Use  . Smoking status: Never Smoker  . Smokeless tobacco: Never Used  Substance and Sexual Activity  . Alcohol use: No    Alcohol/week: 0.0 oz  . Drug use: No  . Sexual activity: Not Currently  Other Topics Concern  . Not on file  Social History Narrative  . Not on file    Allergies  Allergen Reactions  . Lisinopril Other (See Comments)    Chest pain, dizziness, cough  . Codeine     "hair crawling"  . Hctz [Hydrochlorothiazide]  Jittery feeling    Outpatient Medications Prior to Visit  Medication Sig Dispense Refill  . propranolol (INDERAL) 20 MG tablet Take 1 tablet (20 mg total) by mouth every morning. 90 tablet 3  . aspirin 325 MG tablet Take 325 mg by mouth as needed.     No facility-administered medications prior to visit.     Review of Systems  Constitutional: Positive for diaphoresis. Negative for chills, fever, malaise/fatigue and weight loss.  HENT: Negative for ear discharge, ear pain and sore throat.   Eyes: Negative for blurred vision.  Respiratory: Positive for shortness  of breath. Negative for cough, sputum production and wheezing.   Cardiovascular: Positive for chest pain and palpitations. Negative for orthopnea, leg swelling and PND.  Gastrointestinal: Negative for abdominal pain, blood in stool, constipation, diarrhea, heartburn, melena and nausea.  Genitourinary: Negative for dysuria, frequency, hematuria and urgency.  Musculoskeletal: Positive for arthritis. Negative for back pain, joint pain, myalgias and neck pain.  Skin: Negative for rash.  Neurological: Negative for dizziness, tingling, sensory change, focal weakness and headaches.  Endo/Heme/Allergies: Negative for environmental allergies and polydipsia. Does not bruise/bleed easily.  Psychiatric/Behavioral: Negative for depression and suicidal ideas. The patient is not nervous/anxious and does not have insomnia.      Objective  Vitals:   03/27/17 0846  BP: 128/80  Pulse: 64  Weight: 202 lb (91.6 kg)  Height: 5\' 5"  (1.651 m)    Physical Exam  Constitutional: She is well-developed, well-nourished, and in no distress. No distress.  HENT:  Head: Normocephalic and atraumatic.  Right Ear: External ear normal.  Left Ear: External ear normal.  Nose: Nose normal.  Mouth/Throat: Oropharynx is clear and moist.  Eyes: Conjunctivae and EOM are normal. Pupils are equal, round, and reactive to light. Right eye exhibits no discharge. Left eye exhibits no discharge.  Neck: Normal range of motion. Neck supple. No JVD present. No thyromegaly present.  Cardiovascular: Normal rate, regular rhythm, S1 normal, S2 normal, normal heart sounds and intact distal pulses. Exam reveals no gallop, no S3 and no friction rub.  No murmur heard. Pulmonary/Chest: Effort normal and breath sounds normal. She has no wheezes. She has no rales.  Abdominal: Soft. Bowel sounds are normal. She exhibits no mass. There is no tenderness. There is no guarding.  Musculoskeletal: Normal range of motion. She exhibits no edema.   Lymphadenopathy:    She has no cervical adenopathy.  Neurological: She is alert.  Skin: Skin is warm and dry. She is not diaphoretic.  Psychiatric: Mood and affect normal.  Nursing note and vitals reviewed.     Assessment & Plan  Problem List Items Addressed This Visit      Cardiovascular and Mediastinum   Essential hypertension - Primary   Relevant Medications   aspirin 325 MG tablet    Other Visit Diagnoses    Chest pain, unspecified type       Relevant Orders   EKG 12-Lead (Completed)   Ambulatory referral to Cardiology   Class 1 obesity due to excess calories with body mass index (BMI) of 33.0 to 33.9 in adult, unspecified whether serious comorbidity present       Pure hypercholesterolemia       Relevant Medications   aspirin 325 MG tablet      Meds ordered this encounter  Medications  . aspirin 325 MG tablet    Sig: Take 1 tablet (325 mg total) by mouth as needed.      Dr. Elizabeth Sauer [place]  [facility]  03/27/17

## 2017-03-30 ENCOUNTER — Telehealth: Payer: Self-pay

## 2017-03-30 ENCOUNTER — Other Ambulatory Visit: Payer: Self-pay

## 2017-03-30 MED ORDER — AZITHROMYCIN 250 MG PO TABS
ORAL_TABLET | ORAL | 0 refills | Status: DC
Start: 2017-03-30 — End: 2017-04-25

## 2017-03-30 NOTE — Telephone Encounter (Signed)
Pt was just in- wanted a ZPack sent into pharm[facility] [place]

## 2017-04-24 ENCOUNTER — Ambulatory Visit
Admission: EM | Admit: 2017-04-24 | Discharge: 2017-04-24 | Disposition: A | Discharge status: Other Acute Inpatient Hospital | Payer: Self-pay | Attending: Family Medicine | Admitting: Family Medicine

## 2017-04-24 ENCOUNTER — Encounter: Payer: Self-pay | Admitting: *Deleted

## 2017-04-24 ENCOUNTER — Emergency Department: Payer: Self-pay

## 2017-04-24 ENCOUNTER — Observation Stay
Admission: EM | Admit: 2017-04-24 | Discharge: 2017-04-25 | Disposition: A | Discharge status: Home / Self Care | Payer: Self-pay | Attending: Internal Medicine | Admitting: Internal Medicine

## 2017-04-24 ENCOUNTER — Other Ambulatory Visit: Payer: Self-pay

## 2017-04-24 DIAGNOSIS — Z79899 Other long term (current) drug therapy: Secondary | ICD-10-CM | POA: Insufficient documentation

## 2017-04-24 DIAGNOSIS — I10 Essential (primary) hypertension: Secondary | ICD-10-CM | POA: Insufficient documentation

## 2017-04-24 DIAGNOSIS — I200 Unstable angina: Principal | ICD-10-CM | POA: Insufficient documentation

## 2017-04-24 DIAGNOSIS — I259 Chronic ischemic heart disease, unspecified: Secondary | ICD-10-CM

## 2017-04-24 DIAGNOSIS — Z8249 Family history of ischemic heart disease and other diseases of the circulatory system: Secondary | ICD-10-CM | POA: Insufficient documentation

## 2017-04-24 DIAGNOSIS — E669 Obesity, unspecified: Secondary | ICD-10-CM | POA: Insufficient documentation

## 2017-04-24 DIAGNOSIS — K219 Gastro-esophageal reflux disease without esophagitis: Secondary | ICD-10-CM | POA: Insufficient documentation

## 2017-04-24 DIAGNOSIS — Z7982 Long term (current) use of aspirin: Secondary | ICD-10-CM | POA: Insufficient documentation

## 2017-04-24 DIAGNOSIS — Z6833 Body mass index (BMI) 33.0-33.9, adult: Secondary | ICD-10-CM | POA: Insufficient documentation

## 2017-04-24 DIAGNOSIS — R0789 Other chest pain: Secondary | ICD-10-CM | POA: Insufficient documentation

## 2017-04-24 DIAGNOSIS — R079 Chest pain, unspecified: Secondary | ICD-10-CM | POA: Diagnosis present

## 2017-04-24 HISTORY — DX: Other forms of angina pectoris: I20.89

## 2017-04-24 HISTORY — DX: Other forms of angina pectoris: I20.8

## 2017-04-24 LAB — COMPREHENSIVE METABOLIC PANEL
ALK PHOS: 77 U/L (ref 38–126)
ALT: 15 U/L (ref 14–54)
AST: 23 U/L (ref 15–41)
Albumin: 4.1 g/dL (ref 3.5–5.0)
Anion gap: 9 (ref 5–15)
BILIRUBIN TOTAL: 0.7 mg/dL (ref 0.3–1.2)
BUN: 20 mg/dL (ref 6–20)
CALCIUM: 9.4 mg/dL (ref 8.9–10.3)
CO2: 24 mmol/L (ref 22–32)
CREATININE: 0.86 mg/dL (ref 0.44–1.00)
Chloride: 107 mmol/L (ref 101–111)
Glucose, Bld: 91 mg/dL (ref 65–99)
Potassium: 3.6 mmol/L (ref 3.5–5.1)
SODIUM: 140 mmol/L (ref 135–145)
Total Protein: 6.8 g/dL (ref 6.5–8.1)

## 2017-04-24 LAB — LIPASE, BLOOD: Lipase: 28 U/L (ref 11–51)

## 2017-04-24 LAB — TROPONIN I
Troponin I: <0.03 ng/mL (ref <0.03)
Troponin I: <0.03 ng/mL (ref <0.03)

## 2017-04-24 LAB — CBC
HCT: 41.3 % (ref 35.0–47.0)
HCT: 40.1 % (ref 35.0–47.0)
HEMOGLOBIN: 14.1 g/dL (ref 12.0–16.0)
Hemoglobin: 13.8 g/dL (ref 12.0–16.0)
MCH: 29.9 pg (ref 26.0–34.0)
MCH: 29.8 pg (ref 26.0–34.0)
MCHC: 34.1 g/dL (ref 32.0–36.0)
MCHC: 34.3 g/dL (ref 32.0–36.0)
MCV: 87.7 fL (ref 80.0–100.0)
MCV: 86.8 fL (ref 80.0–100.0)
PLATELETS: 262 10*3/uL (ref 150–440)
Platelets: 246 10*3/uL (ref 150–440)
RBC: 4.7 MIL/uL (ref 3.80–5.20)
RBC: 4.62 MIL/uL (ref 3.80–5.20)
RDW: 13.3 % (ref 11.5–14.5)
RDW: 13.3 % (ref 11.5–14.5)
WBC: 6.5 10*3/uL (ref 3.6–11.0)
WBC: 7.4 10*3/uL (ref 3.6–11.0)

## 2017-04-24 LAB — CREATININE, SERUM: CREATININE: 0.79 mg/dL (ref 0.44–1.00)

## 2017-04-24 MED ORDER — ONDANSETRON HCL 4 MG/2ML IJ SOLN: 4.0000 mg | Freq: Four times a day (QID) | INTRAMUSCULAR | Status: DC | PRN

## 2017-04-24 MED ORDER — ASPIRIN EC 81 MG PO TBEC
81.0000 mg | DELAYED_RELEASE_TABLET | Freq: Every day | ORAL | Status: DC
  Administered 2017-04-25: 81 mg via ORAL
  Filled 2017-04-24: qty 1

## 2017-04-24 MED ORDER — SODIUM CHLORIDE 0.9% FLUSH: 3.0000 mL | INTRAVENOUS | Status: DC | PRN

## 2017-04-24 MED ORDER — PROPRANOLOL HCL 20 MG PO TABS
20.0000 mg | ORAL_TABLET | Freq: Every morning | ORAL | Status: DC
  Administered 2017-04-25: 20 mg via ORAL
  Filled 2017-04-24: qty 1

## 2017-04-24 MED ORDER — SODIUM CHLORIDE 0.9 % IV SOLN: 250.0000 mL | INTRAVENOUS | Status: DC | PRN

## 2017-04-24 MED ORDER — ASPIRIN 300 MG RE SUPP: 300.0000 mg | RECTAL | Status: DC

## 2017-04-24 MED ORDER — HYDRALAZINE HCL 20 MG/ML IJ SOLN
10.0000 mg | INTRAMUSCULAR | Status: DC | PRN
  Administered 2017-04-24: 10 mg via INTRAVENOUS
  Filled 2017-04-24: qty 1

## 2017-04-24 MED ORDER — NITROGLYCERIN 0.4 MG SL SUBL
0.4000 mg | SUBLINGUAL_TABLET | SUBLINGUAL | Status: DC | PRN
  Administered 2017-04-24: 0.4 mg via SUBLINGUAL

## 2017-04-24 MED ORDER — ASPIRIN 81 MG PO CHEW
324.0000 mg | CHEWABLE_TABLET | Freq: Once | ORAL | Status: AC
  Administered 2017-04-24: 324 mg via ORAL

## 2017-04-24 MED ORDER — NITROGLYCERIN 0.4 MG SL SUBL: 0.4000 mg | SUBLINGUAL_TABLET | SUBLINGUAL | Status: DC | PRN

## 2017-04-24 MED ORDER — ASPIRIN 81 MG PO CHEW: 324.0000 mg | CHEWABLE_TABLET | ORAL | Status: DC

## 2017-04-24 MED ORDER — ACETAMINOPHEN 325 MG PO TABS
650.0000 mg | ORAL_TABLET | ORAL | Status: DC | PRN
  Administered 2017-04-25 (×2): 650 mg via ORAL
  Filled 2017-04-24 (×2): qty 2

## 2017-04-24 MED ORDER — ENOXAPARIN SODIUM 40 MG/0.4ML ~~LOC~~ SOLN
40.0000 mg | SUBCUTANEOUS | Status: DC
  Administered 2017-04-24: 40 mg via SUBCUTANEOUS
  Filled 2017-04-24: qty 0.4

## 2017-04-24 MED ORDER — NITROGLYCERIN 2 % TD OINT
0.5000 [in_us] | TOPICAL_OINTMENT | TRANSDERMAL | Status: AC
  Administered 2017-04-24: 0.5 [in_us] via TOPICAL
  Filled 2017-04-24: qty 1

## 2017-04-24 MED ORDER — SODIUM CHLORIDE 0.9% FLUSH
3.0000 mL | Freq: Two times a day (BID) | INTRAVENOUS | Status: DC
  Administered 2017-04-24: 3 mL via INTRAVENOUS

## 2017-04-24 NOTE — Progress Notes (Signed)
BP 187/86.  Called Dr. Tobi BastosPyreddy for PRN BP med.  Hydralazine 10 mg IV given.

## 2017-04-24 NOTE — Progress Notes (Signed)
Chaplain received an order to visit with pt in room 237. Chaplain provided [employer] and a pastoral presence.

## 2017-04-24 NOTE — ED Provider Notes (Signed)
[facility]    CSN: [id number] Arrival date & time: 04/24/17  1317     History   Chief Complaint Chief Complaint  Patient presents with  . Chest Pain    HPI Lauren Leblanc is a 67 y.o. female.   67 yo female with a h/o hypertension, angina and family history CAD presents with a c/o "chest pressure", "heaviness", 7/10 intermittently since yesterday but now consistent since this morning. States pressure is associated with feeling of shortness of breath and feels like pain radiates some to the back. Denies any cough, fevers, chills, nausea, vomiting, injuries. Patient had been referred to cardiology by her PCP and had a stress test scheduled for this week (Thursday).     Chest Pain    Past Medical History:  Diagnosis Date  . GERD (gastroesophageal reflux disease)   . Hypertension     Patient Active Problem List   Diagnosis Date Noted  . Essential hypertension 01/06/2016    Past Surgical History:  Procedure Laterality Date  . BACK SURGERY    . BREAST BIOPSY Bilateral    neg  . TUBAL LIGATION      OB History    No data available       Home Medications    Prior to Admission medications   Medication Sig Start Date End Date Taking? Authorizing Provider  aspirin 325 MG tablet Take 1 tablet (325 mg total) by mouth as needed. 03/27/17  Yes Duanne Limerick, MD  aspirin 81 MG chewable tablet Chew 81 mg by mouth daily.   Yes [provider]  propranolol (INDERAL) 20 MG tablet Take 1 tablet (20 mg total) by mouth every morning. 02/13/17  Yes Duanne Limerick, MD  azithromycin (ZITHROMAX) 250 MG tablet Use as directed 03/30/17   Duanne Limerick, MD    Family History Family History  Problem Relation Age of Onset  . Coronary artery disease Mother 86  . Hypertension Mother   . Stroke Father   . Diabetes Father   . Hypertension Sister   . Allergies Sister   . Breast cancer Maternal Aunt        3 mat aunts    Social History Social History     Tobacco Use  . Smoking status: Never Smoker  . Smokeless tobacco: Never Used  Substance Use Topics  . Alcohol use: No    Alcohol/week: 0.0 oz  . Drug use: No     Allergies   Lisinopril; Codeine; and Hctz [hydrochlorothiazide]   Review of Systems Review of Systems  Cardiovascular: Positive for chest pain.     Physical Exam Triage Vital Signs ED Triage Vitals [04/24/17 1324]  Enc Vitals Group     BP (!) 186/91     Pulse Rate 62     Resp 17     Temp (!) 97.5 F (36.4 C)     Temp Source Oral     SpO2 100 %     Weight 197 lb (89.4 kg)     Height 5\' 5"  (1.651 m)     Head Circumference      Peak Flow      Pain Score 8     Pain Loc      Pain Edu?      Excl. in GC?    No data found.  Updated Vital Signs BP (!) 186/91 (BP Location: Left Arm)   Pulse 62   Temp (!) 97.5 F (36.4 C) (Oral)   Resp  17   Ht 5\' 5"  (1.651 m)   Wt 197 lb (89.4 kg)   SpO2 100%   BMI 32.78 kg/m   Visual Acuity Right Eye Distance:   Left Eye Distance:   Bilateral Distance:    Right Eye Near:   Left Eye Near:    Bilateral Near:     Physical Exam  Constitutional: She appears well-developed and well-nourished. No distress.  Cardiovascular: Normal rate, regular rhythm, normal heart sounds and intact distal pulses.  Pulmonary/Chest: Effort normal and breath sounds normal. No stridor. No respiratory distress. She has no wheezes. She has no rales.  Abdominal: Soft. Bowel sounds are normal.  Skin: She is not diaphoretic.  Nursing note and vitals reviewed.    UC Treatments / Results  Labs (all labs ordered are listed, but only abnormal results are displayed) Labs Reviewed - No data to display  EKG  EKG Interpretation None       Radiology No results found.  Procedures ED EKG Date/Time: 04/24/2017 1:55 PM Performed by: Payton Mccallumonty, Franciscojavier Wronski, MD Authorized by: Payton Mccallumonty, Aadyn Buchheit, MD   ECG reviewed by ED Physician in the absence of a cardiologist: yes   Previous ECG:    Previous  ECG:  Compared to current   Similarity:  Changes noted Interpretation:    Interpretation: abnormal   Rate:    ECG rate assessment: normal   Rhythm:    Rhythm: sinus rhythm   Ectopy:    Ectopy: none   QRS:    QRS axis:  Normal Conduction:    Conduction: normal   ST segments:    ST segments:  Normal T waves:    T waves: normal   Other findings:    Other findings: LVH     (including critical care time)  Medications Ordered in UC Medications  nitroGLYCERIN (NITROSTAT) SL tablet 0.4 mg (0.4 mg Sublingual Given 04/24/17 1357)  aspirin chewable tablet 324 mg (324 mg Oral Given 04/24/17 1348)     Initial Impression / Assessment and Plan / UC Course  I have reviewed the triage vital signs and the nursing notes.  Pertinent labs & imaging results that were available during my care of the patient were reviewed by me and considered in my medical decision making (see chart for details).       Final Clinical Impressions(s) / UC Diagnoses   Final diagnoses:  Chest pressure    ED Discharge Orders    None     1. ekg results and diagnosis reviewed with patient and husband; due to risk factors, recurrent anginal symptoms and worsening symptoms today, recommend patient go to Emergency Department for further evaluation and management.    Controlled Substance Prescriptions [place] Controlled Substance Registry consulted? Not Applicable   Payton Mccallumonty, Shawnese Magner, MD 04/24/17 1401

## 2017-04-24 NOTE — Plan of Care (Signed)
Patient is currently pain free. Patient is scheduled to have stress test in the morning.

## 2017-04-24 NOTE — ED Triage Notes (Signed)
Patient complains of center of chest pain that started yesterday. Patient states that she had the pain all day yesterday and then again today the pain does not seem to be letting up. Patient reports that she has been seeing Dr. Yetta BarreJones for Angina and currently has an Stress Test scheduled for Thursday.

## 2017-04-24 NOTE — ED Notes (Signed)
Patient taken to xray.
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
## 2017-04-24 NOTE — H&P (Signed)
[facility] [place] [facility]   PATIENT NAME: Lauren Leblanc    MR#:  [id number]  DATE OF BIRTH:  12-29-1950  DATE OF ADMISSION:  04/24/2017  PRIMARY CARE PHYSICIAN: Duanne Limerick, MD   REQUESTING/REFERRING PHYSICIAN:   CHIEF COMPLAINT:   Chief Complaint  Patient presents with  . Chest Pain    HISTORY OF PRESENT ILLNESS: Lauren Leblanc  is a 67 y.o. female with a known history of angina, GERD, hypertension presented to the emergency room with chest pain.  Pain is been going on since yesterday and located in the left side of the chest.  Patient feels it like pressure in the chest and discomfort.  The chest pain is 6 out of 10 on a scale of 1-10.  Patient also has this on and off chest pain since last summer.  She has seen [facility] cardiology as outpatient and due for a stress test on Thursday.  Patient has family history of coronary artery disease and has a history of hypertension.  No complaints of shortness of breath, orthopnea proximal nocturnal dyspnea.  No headache dizziness and blurry vision.  First set of troponin is negative.  Patient has received aspirin and nitroglycerin so far in the room.  PAST MEDICAL HISTORY:   Past Medical History:  Diagnosis Date  . Angina at rest [facility])   . GERD (gastroesophageal reflux disease)   . Hypertension     PAST SURGICAL HISTORY:  Past Surgical History:  Procedure Laterality Date  . BACK SURGERY    . BREAST BIOPSY Bilateral    neg  . TUBAL LIGATION      SOCIAL HISTORY:  Social History   Tobacco Use  . Smoking status: Never Smoker  . Smokeless tobacco: Never Used  Substance Use Topics  . Alcohol use: No    Alcohol/week: 0.0 oz    FAMILY HISTORY:  Family History  Problem Relation Age of Onset  . Coronary artery disease Mother 51  . Hypertension Mother   . Stroke Father   . Diabetes Father   . Hypertension Sister   . Allergies Sister   . Breast cancer Maternal Aunt        3  mat aunts    DRUG ALLERGIES:  Allergies  Allergen Reactions  . Lisinopril Other (See Comments)    Chest pain, dizziness, cough  . Codeine     "hair crawling"  . Hctz [Hydrochlorothiazide]     Jittery feeling    REVIEW OF SYSTEMS:   CONSTITUTIONAL: No fever, fatigue or weakness.  EYES: No blurred or double vision.  EARS, NOSE, AND THROAT: No tinnitus or ear pain.  RESPIRATORY: No cough, shortness of breath, wheezing or hemoptysis.  CARDIOVASCULAR: Has chest pain,  No orthopnea, edema.  GASTROINTESTINAL: No nausea, vomiting, diarrhea or abdominal pain.  GENITOURINARY: No dysuria, hematuria.  ENDOCRINE: No polyuria, nocturia,  HEMATOLOGY: No anemia, easy bruising or bleeding SKIN: No rash or lesion. MUSCULOSKELETAL: No joint pain or arthritis.   NEUROLOGIC: No tingling, numbness, weakness.  PSYCHIATRY: No anxiety or depression.   MEDICATIONS AT HOME:  Prior to Admission medications   Medication Sig Start Date End Date Taking? Authorizing Provider  aspirin 325 MG tablet Take 1 tablet (325 mg total) by mouth as needed. 03/27/17   Duanne Limerick, MD  aspirin 81 MG chewable tablet Chew 81 mg by mouth daily.    [provider]  azithromycin (ZITHROMAX) 250 MG tablet Use as directed Patient not taking: Reported  on 04/24/2017 03/30/17   Duanne Limerick, MD  propranolol (INDERAL) 20 MG tablet Take 1 tablet (20 mg total) by mouth every morning. 02/13/17   Duanne Limerick, MD      PHYSICAL EXAMINATION:   VITAL SIGNS: Blood pressure (!) 174/80, pulse (!) 58, temperature (!) 97.4 F (36.3 C), temperature source Oral, resp. rate 14, height 5\' 5"  (1.651 m), weight 89.4 kg (197 lb), SpO2 99 %.  GENERAL:  67 y.o.-year-old patient lying in the bed with no acute distress.  EYES: Pupils equal, round, reactive to light and accommodation. No scleral icterus. Extraocular muscles intact.  HEENT: Head atraumatic, normocephalic. Oropharynx and nasopharynx clear.  NECK:  Supple, no jugular  venous distention. No thyroid enlargement, no tenderness.  LUNGS: Normal breath sounds bilaterally, no wheezing, rales,rhonchi or crepitation. No use of accessory muscles of respiration.  CARDIOVASCULAR: S1, S2 normal. No murmurs, rubs, or gallops.  ABDOMEN: Soft, nontender, nondistended. Bowel sounds present. No organomegaly or mass.  EXTREMITIES: No pedal edema, cyanosis, or clubbing.  NEUROLOGIC: Cranial nerves II through XII are intact. Muscle strength 5/5 in all extremities. Sensation intact. Gait not checked.  PSYCHIATRIC: The patient is alert and oriented x 3.  SKIN: No obvious rash, lesion, or ulcer.   LABORATORY PANEL:   CBC Recent Labs  Lab 04/24/17 1442  WBC 6.5  HGB 14.1  HCT 41.3  PLT 246  MCV 87.7  MCH 29.9  MCHC 34.1  RDW 13.3   ------------------------------------------------------------------------------------------------------------------  Chemistries  Recent Labs  Lab 04/24/17 1442  NA 140  K 3.6  CL 107  CO2 24  GLUCOSE 91  BUN 20  CREATININE 0.86  CALCIUM 9.4  AST 23  ALT 15  ALKPHOS 77  BILITOT 0.7   ------------------------------------------------------------------------------------------------------------------ estimated creatinine clearance is 73 mL/min (by C-G formula based on SCr of 0.86 mg/dL). ------------------------------------------------------------------------------------------------------------------ No results for input(s): TSH, T4TOTAL, T3FREE, THYROIDAB in the last 72 hours.  Invalid input(s): FREET3   Coagulation profile No results for input(s): INR, PROTIME in the last 168 hours. ------------------------------------------------------------------------------------------------------------------- No results for input(s): DDIMER in the last 72 hours. -------------------------------------------------------------------------------------------------------------------  Cardiac Enzymes Recent Labs  Lab 04/24/17 1442   TROPONINI <0.03   ------------------------------------------------------------------------------------------------------------------ Invalid input(s): POCBNP  ---------------------------------------------------------------------------------------------------------------  Urinalysis    Component Value Date/Time   COLORURINE YELLOW (A) 01/11/2016 1735   APPEARANCEUR CLEAR (A) 01/11/2016 1735   LABSPEC 1.017 01/11/2016 1735   PHURINE 5.0 01/11/2016 1735   GLUCOSEU NEGATIVE 01/11/2016 1735   HGBUR NEGATIVE 01/11/2016 1735   BILIRUBINUR NEGATIVE 01/11/2016 1735   BILIRUBINUR negative 11/05/2014 1454   KETONESUR NEGATIVE 01/11/2016 1735   PROTEINUR NEGATIVE 01/11/2016 1735   UROBILINOGEN 0.2 11/05/2014 1454   NITRITE NEGATIVE 01/11/2016 1735   LEUKOCYTESUR TRACE (A) 01/11/2016 1735     RADIOLOGY: Dg Chest 2 View  Result Date: 04/24/2017 CLINICAL DATA:  67 year old female with a history of right-sided chest pain EXAM: CHEST  2 VIEW COMPARISON:  01/11/2016 FINDINGS: Cardiomediastinal silhouette within normal limits. No evidence of central vascular congestion. No pneumothorax or pleural effusion. No confluent airspace disease. No displaced fracture IMPRESSION: Negative for acute cardiopulmonary disease Electronically Signed   By: Gilmer Mor D.O.   On: 04/24/2017 15:10    EKG: Orders placed or performed during the hospital encounter of 04/24/17  . ED EKG  . ED EKG  . EKG 12-Lead  . EKG 12-Lead    IMPRESSION AND PLAN: 67 year old female patient with history of angina, GERD, hypertension presented to the emergency room with  chest pain.  Admitting diagnosis : 1.  Unstable angina  2. GERD 3.  Hypertension  Treatment plan : Admit patient to telemetry observation bed Start patient on aspirin and nitrates Cardiology consultation Cycle troponin and check echocardiogram Cardiac stress test in a.m. to rule out ischemia DVT prophylaxis subcu Lovenox 40 mg daily PRN IV morphine  for chest pain  All the records are reviewed and case discussed with ED provider. Management plans discussed with the patient, family and they are in agreement.  CODE STATUS:FULL CODE Code Status History    This patient does not have a recorded code status. Please follow your organizational policy for patients in this situation.       TOTAL TIME TAKING CARE OF THIS PATIENT: 52 minutes.    Ihor AustinPavan Pyreddy M.D on 04/24/2017 at 3:47 PM  Between 7am to 6pm - Pager - [phone]  After 6pm go to [email or web address] - password [email or web address] [facility]  [place] [facility]  Office  [phone]  CC: Primary care physician; Duanne LimerickJones, Deanna C, MD

## 2017-04-24 NOTE — ED Triage Notes (Signed)
Per EMS report, patient c/o intermitternt chest pain yesterday which became constant today.Patient is scheduled for stress test in two days. Patient describes pain as pressure. Patient was given 324mg  aspirin PO and [facility] her [facility] ambulance here.

## 2017-04-25 ENCOUNTER — Observation Stay (HOSPITAL_BASED_OUTPATIENT_CLINIC_OR_DEPARTMENT_OTHER)
Admit: 2017-04-25 | Discharge: 2017-04-25 | Disposition: A | Discharge status: Home / Self Care | Payer: Self-pay | Attending: Internal Medicine | Admitting: Internal Medicine

## 2017-04-25 ENCOUNTER — Observation Stay: Payer: Self-pay

## 2017-04-25 DIAGNOSIS — I340 Nonrheumatic mitral (valve) insufficiency: Secondary | ICD-10-CM

## 2017-04-25 LAB — ECHOCARDIOGRAM COMPLETE
HEIGHTINCHES: 65 in
WEIGHTICAEL: 3214.4 [oz_av]

## 2017-04-25 LAB — BASIC METABOLIC PANEL
Anion gap: 8 (ref 5–15)
BUN: 18 mg/dL (ref 6–20)
CALCIUM: 9.3 mg/dL (ref 8.9–10.3)
CO2: 25 mmol/L (ref 22–32)
CREATININE: 0.61 mg/dL (ref 0.44–1.00)
Chloride: 110 mmol/L (ref 101–111)
GFR calc Af Amer: >60 mL/min (ref >60)
Glucose, Bld: 100 mg/dL — ABNORMAL HIGH (ref 65–99)
Potassium: 3.6 mmol/L (ref 3.5–5.1)
SODIUM: 143 mmol/L (ref 135–145)

## 2017-04-25 LAB — LIPID PANEL
CHOLESTEROL: 201 mg/dL — AB (ref 0–200)
HDL: 38 mg/dL — ABNORMAL LOW (ref >40)
LDL Cholesterol: 116 mg/dL — ABNORMAL HIGH (ref 0–99)
Total CHOL/HDL Ratio: 5.3 RATIO
Triglycerides: 233 mg/dL — ABNORMAL HIGH (ref <150)
VLDL: 47 mg/dL — AB (ref 0–40)

## 2017-04-25 LAB — TROPONIN I

## 2017-04-25 MED ORDER — TECHNETIUM TC 99M TETROFOSMIN IV KIT
30.0000 | PACK | Freq: Once | INTRAVENOUS | Status: AC | PRN
  Administered 2017-04-25: 29.49 via INTRAVENOUS

## 2017-04-25 MED ORDER — TECHNETIUM TC 99M TETROFOSMIN IV KIT
14.0800 | PACK | Freq: Once | INTRAVENOUS | Status: AC | PRN
  Administered 2017-04-25: 14.08 via INTRAVENOUS

## 2017-04-25 MED ORDER — ATORVASTATIN CALCIUM 20 MG PO TABS: 20.0000 mg | ORAL_TABLET | Freq: Every day | ORAL | Status: DC

## 2017-04-25 NOTE — Progress Notes (Signed)
*  PRELIMINARY RESULTS* Echocardiogram 2D Echocardiogram has been performed.  Cristela BlueHege, Bart Ashford 04/25/2017, 3:01 PM

## 2017-04-25 NOTE — Care Management (Signed)
Placed in observation for chest pain. Troponins are negative. Cardiology consulting.  Patient for stress test today. Family history of cardiac disease.

## 2017-04-25 NOTE — Discharge Instructions (Signed)

## 2017-04-25 NOTE — Consult Note (Signed)
Reason for Consult: Angina possibly unstable Referring Physician: Dr. Otilio Miu primary, Dr. Estanislado Pandy hospitalist  Lauren Leblanc is an 67 y.o. female.  HPI: Patient is a 67 year old female referred by Dr. Otilio Miu originally because of chest pain and anginal symptoms patient set up for functional study at [facility] clinic later on this week but got progressively worse with rest pain diaphoresis shortness of breath she came the emergency room rule out for myocardial infarction had unremarkable EKG.  Because of midsternal chest discomfort radiating into her back which is was high as a 8 out of 10 she was admitted troponins were negative denies any previous cardiac history no smoking now here for further cardiac assessment  Past Medical History:  Diagnosis Date  . [facility])   . GERD (gastroesophageal reflux disease)   . Hypertension     Past Surgical History:  Procedure Laterality Date  . BACK SURGERY    . BREAST BIOPSY Bilateral    neg  . TUBAL LIGATION      Family History  Problem Relation Age of Onset  . Coronary artery disease Mother 60  . Hypertension Mother   . Stroke Father   . Diabetes Father   . Hypertension Sister   . Allergies Sister   . Breast cancer Maternal Aunt        3 mat aunts    Social History:  reports that  has never smoked. she has never used smokeless tobacco. She reports that she does not drink alcohol or use drugs.  Allergies:  Allergies  Allergen Reactions  . Lisinopril Other (See Comments)    Chest pain, dizziness, cough  . Codeine     "hair crawling"  . Hctz [Hydrochlorothiazide]     Jittery feeling    Medications: I have reviewed the patient's current medications.  Results for orders placed or performed during the hospital encounter of 04/24/17 (from the past 48 hour(s))  CBC     Status: None   Collection Time: 04/24/17  2:42 PM  Result Value Ref Range   WBC 6.5 3.6 - 11.0 K/uL   RBC 4.70 3.80 - 5.20 MIL/uL   Hemoglobin 14.1 12.0 - 16.0 g/dL   HCT 41.3 35.0 - 47.0 %   MCV 87.7 80.0 - 100.0 fL   MCH 29.9 26.0 - 34.0 pg   MCHC 34.1 32.0 - 36.0 g/dL   RDW 13.3 11.5 - 14.5 %   Platelets 246 150 - 440 K/uL    Comment: Performed at [facility], [address] [place], [place] [address]  Comprehensive metabolic panel     Status: None   Collection Time: 04/24/17  2:42 PM  Result Value Ref Range   Sodium 140 135 - 145 mmol/L   Potassium 3.6 3.5 - 5.1 mmol/L   Chloride 107 101 - 111 mmol/L   CO2 24 22 - 32 mmol/L   Glucose, Bld 91 65 - 99 mg/dL   BUN 20 6 - 20 mg/dL   Creatinine, Ser 0.86 0.44 - 1.00 mg/dL   Calcium 9.4 8.9 - 10.3 mg/dL   Total Protein 6.8 6.5 - 8.1 g/dL   Albumin 4.1 3.5 - 5.0 g/dL   AST 23 15 - 41 U/L   ALT 15 14 - 54 U/L   Alkaline Phosphatase 77 38 - 126 U/L   Total Bilirubin 0.7 0.3 - 1.2 mg/dL   GFR calc non Af Amer >60 >60 mL/min   GFR calc Af Amer >60 >60 mL/min  Comment: (NOTE) The eGFR has been calculated using the CKD EPI equation. This calculation has not been validated in all clinical situations. eGFR's persistently <60 mL/min signify possible Chronic Kidney Disease.    Anion gap 9 5 - 15    Comment: Performed at [facility], [address] [place], [place] [address]  Lipase, blood     Status: None   Collection Time: 04/24/17  2:42 PM  Result Value Ref Range   Lipase 28 11 - 51 U/L    Comment: Performed at [facility], [address] [place], [place] [address]  Troponin I     Status: None   Collection Time: 04/24/17  2:42 PM  Result Value Ref Range   Troponin I <0.03 <0.03 ng/mL    Comment: Performed at [facility], [address] [place], [place] [address]  CBC     Status: None   Collection Time: 04/24/17  5:12 PM  Result Value Ref Range   WBC 7.4 3.6 - 11.0 K/uL   RBC 4.62 3.80 - 5.20 MIL/uL   Hemoglobin 13.8 12.0 - 16.0 g/dL   HCT 40.1 35.0 - 47.0 %   MCV 86.8 80.0 - 100.0 fL   MCH 29.8 26.0 -  34.0 pg   MCHC 34.3 32.0 - 36.0 g/dL   RDW 13.3 11.5 - 14.5 %   Platelets 262 150 - 440 K/uL    Comment: Performed at [facility], [address] [place], [place] [address]  Creatinine, serum     Status: None   Collection Time: 04/24/17  5:12 PM  Result Value Ref Range   Creatinine, Ser 0.79 0.44 - 1.00 mg/dL   GFR calc non Af Amer >60 >60 mL/min   GFR calc Af Amer >60 >60 mL/min    Comment: (NOTE) The eGFR has been calculated using the CKD EPI equation. This calculation has not been validated in all clinical situations. eGFR's persistently <60 mL/min signify possible Chronic Kidney Disease. Performed at [facility], [address] [place] [employer] [place], [place] [address]   Troponin I     Status: None   Collection Time: 04/24/17  5:12 PM  Result Value Ref Range   Troponin I <0.03 <0.03 ng/mL    Comment: Performed at [facility], [address] [place], [place] [address]  Troponin I     Status: None   Collection Time: 04/24/17 10:42 PM  Result Value Ref Range   Troponin I <0.03 <0.03 ng/mL    Comment: Performed at [facility], [address] [place], [place] [address]  Troponin I     Status: None   Collection Time: 04/25/17  4:30 AM  Result Value Ref Range   Troponin I <0.03 <0.03 ng/mL    Comment: Performed at [facility], [address] [place], [place] [address]  Basic metabolic panel     Status: Abnormal   Collection Time: 04/25/17  4:30 AM  Result Value Ref Range   Sodium 143 135 - 145 mmol/L   Potassium 3.6 3.5 - 5.1 mmol/L   Chloride 110 101 - 111 mmol/L   CO2 25 22 - 32 mmol/L   Glucose, Bld 100 (H) 65 - 99 mg/dL   BUN 18 6 - 20 mg/dL   Creatinine, Ser 0.61 0.44 - 1.00 mg/dL   Calcium 9.3 8.9 - 10.3 mg/dL   GFR calc non Af Amer >60 >60 mL/min   GFR calc Af Amer >60 >60 mL/min  Comment: (NOTE) The eGFR has been calculated using the CKD EPI equation. This calculation has not been validated in all clinical  situations. eGFR's persistently <60 mL/min signify possible Chronic Kidney Disease.    Anion gap 8 5 - 15    Comment: Performed at [facility], [address] [place], [place] [address]  Lipid panel     Status: Abnormal   Collection Time: 04/25/17  4:30 AM  Result Value Ref Range   Cholesterol 201 (H) 0 - 200 mg/dL   Triglycerides 233 (H) <150 mg/dL   HDL 38 (L) >40 mg/dL   Total CHOL/HDL Ratio 5.3 RATIO   VLDL 47 (H) 0 - 40 mg/dL   LDL Cholesterol 116 (H) 0 - 99 mg/dL    Comment:        Total Cholesterol/HDL:CHD Risk Coronary Heart Disease Risk Table                     Men   Women  1/2 Average Risk   3.4   3.3  Average Risk       5.0   4.4  2 X Average Risk   9.6   7.1  3 X Average Risk  23.4   11.0        Use the calculated Patient Ratio above and the CHD Risk Table to determine the patient's CHD Risk.        ATP III CLASSIFICATION (LDL):  <100     mg/dL   Optimal  100-129  mg/dL   Near or Above                    Optimal  130-159  mg/dL   Borderline  160-189  mg/dL   High  >190     mg/dL   Very High Performed at [facility], [address] [place], [place] [address]     Dg Chest 2 View  Result Date: 04/24/2017 CLINICAL DATA:  67 year old female with a history of right-sided chest pain EXAM: CHEST  2 VIEW COMPARISON:  01/11/2016 FINDINGS: Cardiomediastinal silhouette within normal limits. No evidence of central vascular congestion. No pneumothorax or pleural effusion. No confluent airspace disease. No displaced fracture IMPRESSION: Negative for acute cardiopulmonary disease Electronically Signed   By: Corrie Mckusick D.O.   On: 04/24/2017 15:10    Review of Systems  Constitutional: Positive for diaphoresis and malaise/fatigue.  HENT: Positive for congestion.   Eyes: Negative.   Respiratory: Positive for shortness of breath.   Cardiovascular: Positive for chest pain.  Gastrointestinal: Negative.   Genitourinary: Negative.   Musculoskeletal:  Negative.   Skin: Negative.   Neurological: Positive for weakness.  Endo/Heme/Allergies: Negative.   Psychiatric/Behavioral: Negative.    Blood pressure (!) 148/76, pulse 69, temperature 97.8 F (36.6 C), temperature source Oral, resp. rate 18, height 5' 5" (1.651 m), weight 200 lb 14.4 oz (91.1 kg), SpO2 98 %. Physical Exam  Nursing note and vitals reviewed. Constitutional: She is oriented to person, place, and time. She appears well-developed and well-nourished.  HENT:  Head: Normocephalic and atraumatic.  Eyes: Conjunctivae and EOM are normal. Pupils are equal, round, and reactive to light.  Neck: Normal range of motion. Neck supple.  Cardiovascular: Normal rate and regular rhythm.  Murmur heard. Respiratory: Effort normal and breath sounds normal.  GI: Soft. Bowel sounds are normal.  Musculoskeletal: Normal range of motion.  Neurological: She is alert and oriented to person, place, and time. She has normal  reflexes.  Skin: Skin is warm and dry.  Psychiatric: She has a normal mood and affect.    Assessment/Plan: Unstable angina Chest pain GERD Hypertension Borderline obesity . Plan Agree with admit to telemetry For myocardial infarction Follow-up EKGs and troponins Continue beta-blockade therapy Consider nitrates imdur or paste for angina Recommend anticoagulation short-term Aspirin consider statin unable to tolerate ACE Proceed with functional study for evaluation of angina Consider echocardiogram for assessment of LV function normal Risk stratify based on functional study and echocardiogram  Balen Woolum D Kelvin Sennett 04/25/2017, 8:19 AM

## 2017-04-26 DIAGNOSIS — E782 Mixed hyperlipidemia: Secondary | ICD-10-CM | POA: Insufficient documentation

## 2017-04-26 LAB — HIV ANTIBODY (ROUTINE TESTING W REFLEX): HIV Screen 4th Generation wRfx: NONREACTIVE

## 2017-04-26 NOTE — Discharge Summary (Signed)
[employer] - [place] [facility]   PATIENT NAME: Lauren Leblanc    MR#:  [id number]  DATE OF BIRTH:  1950-07-08  DATE OF ADMISSION:  04/24/2017   ADMITTING PHYSICIAN: Ihor Austin, MD  DATE OF DISCHARGE: 04/25/2017  3:35 PM  PRIMARY CARE PHYSICIAN: Duanne Limerick, MD   ADMISSION DIAGNOSIS:  Moderate risk chest pain [R07.9] DISCHARGE DIAGNOSIS:  Active Problems:   Chest pain  SECONDARY DIAGNOSIS:   Past Medical History:  Diagnosis Date  . Angina at rest [facility])   . GERD (gastroesophageal reflux disease)   . Hypertension    HOSPITAL COURSE:  67 y.o. female with a known history of angina, GERD, hypertension admitted for chest pain  She was ruled out with neg serial troponins and myoview. She also had normal echo. She remained chest pain free while in the Hospital and was D/Ced home in stable condition. DISCHARGE CONDITIONS:  stable CONSULTS OBTAINED:  Treatment Team:  Alwyn Pea, MD DRUG ALLERGIES:   Allergies  Allergen Reactions  . Lisinopril Other (See Comments)    Chest pain, dizziness, cough  . Codeine     "hair crawling"  . Hctz [Hydrochlorothiazide]     Jittery feeling   DISCHARGE MEDICATIONS:   Allergies as of 04/25/2017      Reactions   Lisinopril Other (See Comments)   Chest pain, dizziness, cough   Codeine    "hair crawling"   Hctz [hydrochlorothiazide]    Jittery feeling      Medication List    STOP taking these medications   azithromycin 250 MG tablet Commonly known as:  ZITHROMAX     TAKE these medications   aspirin 325 MG tablet Take 1 tablet (325 mg total) by mouth as needed. What changed:  Another medication with the same name was removed. Continue taking this medication, and follow the directions you see here.   propranolol 20 MG tablet Commonly known as:  INDERAL Take 1 tablet (20 mg total) by mouth every morning.        DISCHARGE INSTRUCTIONS:   DIET:  Cardiac diet DISCHARGE CONDITION:    Good ACTIVITY:  Activity as tolerated OXYGEN:  Home Oxygen: No.  Oxygen Delivery: room air DISCHARGE LOCATION:  home   If you experience worsening of your admission symptoms, develop shortness of breath, life threatening emergency, suicidal or homicidal thoughts you must seek medical attention immediately by calling 911 or calling your MD immediately  if symptoms less severe.  You Must read complete instructions/literature along with all the possible adverse reactions/side effects for all the Medicines you take and that have been prescribed to you. Take any new Medicines after you have completely understood and accpet all the possible adverse reactions/side effects.   Please note  You were cared for by a hospitalist during your hospital stay. If you have any questions about your discharge medications or the care you received while you were in the hospital after you are discharged, you can call the unit and asked to speak with the hospitalist on call if the hospitalist that took care of you is not available. Once you are discharged, your primary care physician will handle any further medical issues. Please note that NO REFILLS for any discharge medications will be authorized once you are discharged, as it is imperative that you return to your primary care physician (or establish a relationship with a primary care physician if you do not have one) for your aftercare needs so that they can  reassess your need for medications and monitor your lab values.    On the day of Discharge:  VITAL SIGNS:  Blood pressure 139/68, pulse 69, temperature 97.8 F (36.6 C), temperature source Oral, resp. rate 16, height 5\' 5"  (1.651 m), weight 91.1 kg (200 lb 14.4 oz), SpO2 99 %. PHYSICAL EXAMINATION:  GENERAL:  67 y.o.-year-old patient lying in the bed with no acute distress.  EYES: Pupils equal, round, reactive to light and accommodation. No scleral icterus. Extraocular muscles intact.  HEENT: Head  atraumatic, normocephalic. Oropharynx and nasopharynx clear.  NECK:  Supple, no jugular venous distention. No thyroid enlargement, no tenderness.  LUNGS: Normal breath sounds bilaterally, no wheezing, rales,rhonchi or crepitation. No use of accessory muscles of respiration.  CARDIOVASCULAR: S1, S2 normal. No murmurs, rubs, or gallops.  ABDOMEN: Soft, non-tender, non-distended. Bowel sounds present. No organomegaly or mass.  EXTREMITIES: No pedal edema, cyanosis, or clubbing.  NEUROLOGIC: Cranial nerves II through XII are intact. Muscle strength 5/5 in all extremities. Sensation intact. Gait not checked.  PSYCHIATRIC: The patient is alert and oriented x 3.  SKIN: No obvious rash, lesion, or ulcer.  DATA REVIEW:   CBC Recent Labs  Lab 04/24/17 1712  WBC 7.4  HGB 13.8  HCT 40.1  PLT 262    Chemistries  Recent Labs  Lab 04/24/17 1442  04/25/17 0430  NA 140  --  143  K 3.6  --  3.6  CL 107  --  110  CO2 24  --  25  GLUCOSE 91  --  100*  BUN 20  --  18  CREATININE 0.86   < > 0.61  CALCIUM 9.4  --  9.3  AST 23  --   --   ALT 15  --   --   ALKPHOS 77  --   --   BILITOT 0.7  --   --    < > = values in this interval not displayed.     Microbiology Results  Results for orders placed or performed during the hospital encounter of 05/22/15  Rapid Influenza A&B Antigens [facility] only)     Status: None   Collection Time: 05/22/15 12:21 PM  Result Value Ref Range Status   Influenza A [facility]) DETECTED  Final   Influenza B [facility]) NOT DETECTED  Final    Follow-up Information    Duanne LimerickJones, Deanna C, MD. Go on 05/01/2017.   Specialty:  Family Medicine Why:  Appointment Time: 9:30am Contact information: [address] [place] [address] [phone]        Lamar BlinksKowalski, Bruce J, MD. Go on 04/26/2017.   Specialty:  Cardiology Why:  Keep and go to appointment previously scheduled at 3:00pm. Thanks Contact information: [address] [facility]  [place] [place] [address] [phone]           Management plans discussed with the patient, family and they are in agreement.  CODE STATUS: Prior   TOTAL TIME TAKING CARE OF THIS PATIENT: 45 minutes.    Delfino LovettVipul Kevonte Vanecek M.D on 04/26/2017 at 1:48 PM  Between 7am to 6pm - Pager - [phone]  After 6pm go to [email or web address] - Social research officer, [email or web address] [email or web address]  [facility] [place] [facility]  Office  [phone]  CC: Primary care physician; Duanne LimerickJones, Deanna C, MD   Note: This dictation was prepared with Dragon dictation along with smaller phrase technology. Any transcriptional errors that result from this process are unintentional.

## 2017-05-01 ENCOUNTER — Encounter: Payer: Self-pay | Admitting: Family Medicine

## 2017-05-01 ENCOUNTER — Ambulatory Visit (INDEPENDENT_AMBULATORY_CARE_PROVIDER_SITE_OTHER): Payer: Self-pay | Admitting: Family Medicine

## 2017-05-01 VITALS — BP 120/80 | HR 68 | Ht 65.0 in | Wt 200.0 lb

## 2017-05-01 DIAGNOSIS — K219 Gastro-esophageal reflux disease without esophagitis: Secondary | ICD-10-CM

## 2017-05-01 DIAGNOSIS — Z09 Encounter for follow-up examination after completed treatment for conditions other than malignant neoplasm: Secondary | ICD-10-CM

## 2017-05-01 DIAGNOSIS — M940 Chondrocostal junction syndrome [Tietze]: Secondary | ICD-10-CM

## 2017-05-01 DIAGNOSIS — I10 Essential (primary) hypertension: Secondary | ICD-10-CM

## 2017-05-01 MED ORDER — RANITIDINE HCL 150 MG PO CAPS: 150.0000 mg | ORAL_CAPSULE | Freq: Every evening | ORAL | 3 refills | Status: DC

## 2017-05-01 NOTE — Patient Instructions (Addendum)
Calorie Counting for Weight Loss Calories are units of energy. Your body needs a certain amount of calories from food to keep you going throughout the day. When you eat more calories than your body needs, your body stores the extra calories as fat. When you eat fewer calories than your body needs, your body burns fat to get the energy it needs. Calorie counting means keeping track of how many calories you eat and drink each day. Calorie counting can be helpful if you need to lose weight. If you make sure to eat fewer calories than your body needs, you should lose weight. Ask your health care provider what a healthy weight is for you. For calorie counting to work, you will need to eat the right number of calories in a day in order to lose a healthy amount of weight per week. A dietitian can help you determine how many calories you need in a day and will give you suggestions on how to reach your calorie goal.  A healthy amount of weight to lose per week is usually 1-2 lb (0.5-0.9 kg). This usually means that your daily calorie intake should be reduced by 500-750 calories.  Eating 1,200 - 1,500 calories per day can help most women lose weight.  Eating 1,500 - 1,800 calories per day can help most men lose weight.  What is my plan? My goal is to have __________ calories per day. If I have this many calories per day, I should lose around __________ pounds per week. What do I need to know about calorie counting? In order to meet your daily calorie goal, you will need to:  Find out how many calories are in each food you would like to eat. Try to do this before you eat.  Decide how much of the food you plan to eat.  Write down what you ate and how many calories it had. Doing this is called keeping a food log.  To successfully lose weight, it is important to balance calorie counting with a healthy lifestyle that includes regular activity. Aim for 150 minutes of moderate exercise (such as walking) or 75  minutes of vigorous exercise (such as running) each week. Where do I find calorie information?  The number of calories in a food can be found on a Nutrition Facts label. If a food does not have a Nutrition Facts label, try to look up the calories online or ask your dietitian for help. Remember that calories are listed per serving. If you choose to have more than one serving of a food, you will have to multiply the calories per serving by the amount of servings you plan to eat. For example, the label on a package of bread might say that a serving size is 1 slice and that there are 90 calories in a serving. If you eat 1 slice, you will have eaten 90 calories. If you eat 2 slices, you will have eaten 180 calories. How do I keep a food log? Immediately after each meal, record the following information in your food log:  What you ate. Don't forget to include toppings, sauces, and other extras on the food.  How much you ate. This can be measured in cups, ounces, or number of items.  How many calories each food and drink had.  The total number of calories in the meal.  Keep your food log near you, such as in a small notebook in your pocket, or use a mobile app or website. Some   programs will calculate calories for you and show you how many calories you have left for the day to meet your goal. What are some calorie counting tips?  Use your calories on foods and drinks that will fill you up and not leave you hungry: ? Some examples of foods that fill you up are nuts and nut butters, vegetables, lean proteins, and high-fiber foods like whole grains. High-fiber foods are foods with more than 5 g fiber per serving. ? Drinks such as sodas, specialty coffee drinks, alcohol, and juices have a lot of calories, yet do not fill you up.  Eat nutritious foods and avoid empty calories. Empty calories are calories you get from foods or beverages that do not have many vitamins or protein, such as candy, sweets, and  soda. It is better to have a nutritious high-calorie food (such as an avocado) than a food with few nutrients (such as a bag of chips).  Know how many calories are in the foods you eat most often. This will help you calculate calorie counts faster.  Pay attention to calories in drinks. Low-calorie drinks include water and unsweetened drinks.  Pay attention to nutrition labels for "low fat" or "fat free" foods. These foods sometimes have the same amount of calories or more calories than the full fat versions. They also often have added sugar, starch, or salt, to make up for flavor that was removed with the fat.  Find a way of tracking calories that works for you. Get creative. Try different apps or programs if writing down calories does not work for you. What are some portion control tips?  Know how many calories are in a serving. This will help you know how many servings of a certain food you can have.  Use a measuring cup to measure serving sizes. You could also try weighing out portions on a kitchen scale. With time, you will be able to estimate serving sizes for some foods.  Take some time to put servings of different foods on your favorite plates, bowls, and cups so you know what a serving looks like.  Try not to eat straight from a bag or box. Doing this can lead to overeating. Put the amount you would like to eat in a cup or on a plate to make sure you are eating the right portion.  Use smaller plates, glasses, and bowls to prevent overeating.  Try not to multitask (for example, watch TV or use your computer) while eating. If it is time to eat, sit down at a table and enjoy your food. This will help you to know when you are full. It will also help you to be aware of what you are eating and how much you are eating. What are tips for following this plan? Reading food labels  Check the calorie count compared to the serving size. The serving size may be smaller than what you are used to  eating.  Check the source of the calories. Make sure the food you are eating is high in vitamins and protein and low in saturated and trans fats. Shopping  Read nutrition labels while you shop. This will help you make healthy decisions before you decide to purchase your food.  Make a grocery list and stick to it. Cooking  Try to cook your favorite foods in a healthier way. For example, try baking instead of frying.  Use low-fat dairy products. Meal planning  Use more fruits and vegetables. Half of your plate should   be fruits and vegetables.  Include lean proteins like poultry and fish. How do I count calories when eating out?  Ask for smaller portion sizes.  Consider sharing an entree and sides instead of getting your own entree.  If you get your own entree, eat only half. Ask for a box at the beginning of your meal and put the rest of your entree in it so you are not tempted to eat it.  If calories are listed on the menu, choose the lower calorie options.  Choose dishes that include vegetables, fruits, whole grains, low-fat dairy products, and lean protein.  Choose items that are boiled, broiled, grilled, or steamed. Stay away from items that are buttered, battered, fried, or served with cream sauce. Items labeled "crispy" are usually fried, unless stated otherwise.  Choose water, low-fat milk, unsweetened iced tea, or other drinks without added sugar. If you want an alcoholic beverage, choose a lower calorie option such as a glass of wine or light beer.  Ask for dressings, sauces, and syrups on the side. These are usually high in calories, so you should limit the amount you eat.  If you want a salad, choose a garden salad and ask for grilled meats. Avoid extra toppings like bacon, cheese, or fried items. Ask for the dressing on the side, or ask for olive oil and vinegar or lemon to use as dressing.  Estimate how many servings of a food you are given. For example, a serving of  cooked rice is  cup or about the size of half a baseball. Knowing serving sizes will help you be aware of how much food you are eating at restaurants. The list below tells you how big or small some common portion sizes are based on everyday objects: ? 1 oz-4 stacked dice. ? 3 oz-1 deck of cards. ? 1 tsp-1 die. ? 1 Tbsp- a ping-pong ball. ? 2 Tbsp-1 ping-pong ball. ?  cup- baseball. ? 1 cup-1 baseball. Summary  Calorie counting means keeping track of how many calories you eat and drink each day. If you eat fewer calories than your body needs, you should lose weight.  A healthy amount of weight to lose per week is usually 1-2 lb (0.5-0.9 kg). This usually means reducing your daily calorie intake by 500-750 calories.  The number of calories in a food can be found on a Nutrition Facts label. If a food does not have a Nutrition Facts label, try to look up the calories online or ask your dietitian for help.  Use your calories on foods and drinks that will fill you up, and not on foods and drinks that will leave you hungry.  Use smaller plates, glasses, and bowls to prevent overeating. This information is not intended to replace advice given to you by your health care provider. Make sure you discuss any questions you have with your health care provider. Document Released: 04/10/2005 Document Revised: 03/10/2016 Document Reviewed: 03/10/2016 Elsevier Interactive Patient Education  2018 [employer]  LOW-CHOLESTEROL, LOW-TRIGLYCERIDE DIETS    FOODS TO USE   MEATS, FISH Choose lean meats (chicken, [place], veal, and non-fatty cuts of beef with excess fat trimmed; one serving = 3 oz of cooked meat). Also, fresh or frozen fish, canned fish packed in water, and shellfish (lobster, crabs, shrimp, and oysters). Limit use to no more than one serving of one of these per week. Shellfish are high in cholesterol but low in saturated fat and should be used sparingly. Meats  and fish should  be broiled (pan or oven) or baked on a rack.  EGGS Egg substitutes and egg whites (use freely). Egg yolks (limit two per week).  FRUITS Eat three servings of fresh fruit per day (1 serving =  cup). Be sure to have at least one citrus fruit daily. Frozen and canned fruit with no sugar or syrup added may be used.  VEGETABLES Most vegetables are not limited (see next page). One dark-green (string beans, escarole) or one deep yellow (squash) vegetable is recommended daily. Cauliflower, broccoli, and celery, as well as potato skins, are recommended for their fiber content. (Fiber is associated with cholesterol reduction) It is preferable to steam vegetables, but they may be boiled, strained, or braised with polyunsaturated vegetable oil (see below).  BEANS Dried peas or beans (1 serving =  cup) may be used as a bread substitute.  NUTS Almonds, walnuts, and peanuts may be used sparingly  (1 serving = 1 Tablespoonful). Use pumpkin, sesame, or sunflower seeds.  BREADS, GRAINS One roll or one slice of whole grain or enriched bread may be used, or three soda crackers or four pieces of melba toast as a substitute. Spaghetti, rice or noodles ( cup) or  large ear of corn may be used as a bread substitute. In preparing these foods do not use butter or shortening, use soft margarine. Also use egg and sugar substitutes.  Choose high fiber grains, such as oats and whole wheat.  CEREALS Use  cup of hot cereal or  cup of cold cereal per day. Add a sugar substitute if desired, with 99% fat free or skim milk.  MILK PRODUCTS Always use 99% fat free or skim milk, dairy products such as low fat cheeses (farmer's uncreamed diet cottage), low-fat yogurt, and powdered skim milk.  FATS, OILS Use soft (not stick) margarine; vegetable oils that are high in polyunsaturated fats (such as safflower, sunflower, soybean, corn, and cottonseed). Always refrigerate meat drippings to harden the fat and remove it before preparing gravies    DESSERTS, SNACKS Limit to two servings per day; substitute each serving for a bread/cereal serving: ice milk, water sherbet (1/4 cup); unflavored gelatin or gelatin flavored with sugar substitute (1/3 cup); pudding prepared with skim milk (1/2 cup); egg white souffls; unbuttered popcorn (1  cups). Substitute carob for chocolate.  BEVERAGES Fresh fruit juices (limit 4 oz per day); black coffee, plain or herbal teas; soft drinks with sugar substitutes; club soda, preferably salt-free; cocoa made with skim milk or nonfat dried milk and water (sugar substitute added if desired); clear broth. Alcohol: limit two servings per day (see second page).  MISCELLANEOUS  You may use the following freely: vinegar, spices, herbs, nonfat bouillon, mustard, Worcestershire sauce, soy sauce, flavoring essence.                  GUIDELINES FOR  LOW-CHOLESTEROL, LOW TRIGLYCERIDE DIETS    FOODS TO AVOID   MEATS, FISH Marbled beef, pork, bacon, sausage, and other pork products; fatty fowl (duck, goose); skin and fat of [place] and chicken; processed meats; luncheon meats (salami, bologna); frankfurters and fast-food hamburgers (theyre loaded with fat); organ meats (kidneys, liver); canned fish packed in oil.  EGGS Limit egg yolks to two per week.   FRUITS Coconuts (rich in saturated fats).  VEGETABLES Avoid avocados. Starchy vegetables (potatoes, corn, lima beans, dried peas, beans) may be used only if substitutes for a serving of bread or cereal. (Baked potato skin, however, is desirable for its  fiber content.  BEANS Commercial baked beans with sugar and/or pork added.  NUTS Avoid nuts.  Limit peanuts and walnuts to one tablespoonful per day.  BREADS, GRAINS Any baked goods with shortening and/or sugar. Commercial mixes with dried eggs and whole milk. Avoid sweet rolls, doughnuts, breakfast pastries (Danish), and sweetened packaged cereals (the added sugar converts readily to triglycerides).  MILK  PRODUCTS Whole milk and whole-milk packaged goods; cream; ice cream; whole-milk puddings, yogurt, or cheeses; nondairy cream substitutes.  FATS, OILS Butter, lard, animal fats, bacon drippings, gravies, cream sauces as well as palm and coconut oils. All these are high in saturated fats. Examine labels on cholesterol free products for hydrogenated fats. (These are oils that have been hardened into solids and in the process have become saturated.)  DESSERTS, SNACKS Fried snack foods like potato chips; chocolate; candies in general; jams, jellies, syrups; whole- milk puddings; ice cream and milk sherbets; hydrogenated peanut butter.  BEVERAGES Sugared fruit juices and soft drinks; cocoa made with whole milk and/or sugar. When using alcohol (1 oz liquor, 5 oz beer, or 2  oz dry table wine per serving), one serving must be substituted for one bread or cereal serving (limit, two servings of alcohol per day).   SPECIAL NOTES    1. Remember that even non-limited foods should be used in moderation. 2. While on a cholesterol-lowering diet, be sure to avoid animal fats and marbled meats. 3. 3. While on a triglyceride-lowering diet, be sure to avoid sweets and to control the amount of carbohydrates you eat (starchy foods such as flour, bread, potatoes).While on a tri-glyceride-lowering diet, be sure to avoid sweets 4. Buy a good low-fat cookbook, such as the one published by the American Heart Association. 5. Consult your physician if you have any questions.               [facility] Low Glycemic Diet Plan   Low Glycemic Foods (20-49) Moderate Glycemic Foods (50-69) High Glycemic Foods (70-100)      Breakfast Creals Breakfast Cereals Breakfast Cereals  All Bran All-Bran Fruit'n Oats   Bran Buds Bran Chex   Cheerios Corn chex    Fiber One Oatmeal (not instant)   Just Right Mini-Wheats   Corn Flakes Cream of Wheat    Oat Bran Special K Swiss Muesli   Grape Nuts Grape Nut Flakes       Grits Nutri-Grain    Fruits and fruit juice: Fruits Puffed Rice Puffed Wheat    (Limit to 1-2 Servings per day) Banana (under-ride) Dates   Rice Chex Rice Krispies    Apples Apricots (fresh/dried)   Figs Grapes   Shredded Wheat Team    Blackberries Blueberries   Kiwi Mango   Total     Cherries Cranberries   Oranges Raisins     Peaches Pears    Fruits  Plums Prunes   Fruit Juices Pineapple Watermelon    Grapefruit Raspberries   Cranberry Juice Orange Juice   Banana (over-ripe)     Strawberries Tangerines      Apple Juice Grapefruit Juice   Beans and Legumes Beverages  Tomato Juice    [place]-type baked beans Sodas, sweet tea, pineapple juice   Canned pinto, kidney, or navy beans   Beans and Legumes (fresh-cooked) Green peas Vegetables  Black-eyed peas Butter Beans    Potato, baked, boiled, fried, mashed  Chick peas Lentils   Vegetables French fries  Green beans Lima beans   Beets Carrots     Canned or frozen corn  Kidney beans Navy beans   Sweet potato Yam   Parsnips  Pinto beans Snow peas   Corn on the cob Winter squash      Non-starchy vegetables Grains Breads  Asparagus, avocado, broccoli, cabbage Cornmeal Rice, brown   Most breads (white and whole grain)  cauliflower, celery, cucumber, greens Rice, white Couscous   Bagels Bread sticks    lettuce, mushrooms, peppers, tomatoes  Bread stuffing Kaiser roll    okra, onions, spinach, summer squash Pasta Dinner rolls   General ElectricMacaroni Pizza, cheese     Grains Ravioli, meat filled Spaghetti, white   Grains  Barley Bulgur    Rice, instant Tapioca, with milk    Rye Wild rice   Nuts    Cashews Macadamia   Candy and most cookies  Nuts and oils    Almonds, peanuts, sunflower seeds Snacks Snacks  hazelnuts, pecans, walnuts Chocolate Ice cream, lowfat   Donuts Corn chips    Oils that are liquid at room temperature Muffin Popcorn   Jelly beans Pretzels      Pastries  Dairy, fish, meat, soy, and eggs    Milk,  skim Lowfat cheese    Restaurant and ethnic foods  Yogurt, lowfat, fruit sugar sweetened  Most Congohinese food (sugar in stir fry    or wok sauce)  Lean red meat Fish    Teriyaki-style meats and vegetables  Skinless chicken and Malaw[place]urkey, shellfish        Egg whites (up to 3 daily), Soy Products    Egg yolks (up to 7 or _____ per week)      Costochondritis Costochondritis is swelling and irritation (inflammation) of the tissue (cartilage) that connects your ribs to your breastbone (sternum). This causes pain in the front of your chest. The pain usually starts gradually and involves more than one rib. What are the causes? The exact cause of this condition is not always known. It results from stress on the cartilage where your ribs attach to your sternum. The cause of this stress could be:  Chest injury (trauma).  Exercise or activity, such as lifting.  Severe coughing.  What increases the risk? You may be at higher risk for this condition if you:  Are female.  Are 2028?67 years old.  Recently started a new exercise or work activity.  Have low levels of vitamin D.  Have a condition that makes you cough frequently.  What are the signs or symptoms? The main symptom of this condition is chest pain. The pain:  Usually starts gradually and can be sharp or dull.  Gets worse with deep breathing, coughing, or exercise.  Gets better with rest.  May be worse when you press on the sternum-rib connection (tenderness).  How is this diagnosed? This condition is diagnosed based on your symptoms, medical history, and a physical exam. Your health care provider will check for tenderness when pressing on your sternum. This is the most important finding. You may also have tests to rule out other causes of chest pain. These may include:  A chest X-ray to check for lung problems.  An electrocardiogram (ECG) to see if you have a heart problem that could be causing the pain.  An imaging scan to  rule out a chest or rib fracture.  How is this treated? This condition usually goes away on its own over time. Your health care provider may prescribe an NSAID to reduce pain and inflammation. Your health care provider may also  suggest that you:  Rest and avoid activities that make pain worse.  Apply heat or cold to the area to reduce pain and inflammation.  Do exercises to stretch your chest muscles.  If these treatments do not help, your health care provider may inject a numbing medicine at the sternum-rib connection to help relieve the pain. Follow these instructions at home:  Avoid activities that make pain worse. This includes any activities that use chest, abdominal, and side muscles.  If directed, put ice on the painful area: ? Put ice in a plastic bag. ? Place a towel between your skin and the bag. ? Leave the ice on for 20 minutes, 2-3 times a day.  If directed, apply heat to the affected area as often as told by your health care provider. Use the heat source that your health care provider recommends, such as a moist heat pack or a heating pad. ? Place a towel between your skin and the heat source. ? Leave the heat on for 20-30 minutes. ? Remove the heat if your skin turns bright red. This is especially important if you are unable to feel pain, heat, or cold. You may have a greater risk of getting burned.  Take over-the-counter and prescription medicines only as told by your health care provider.  Return to your normal activities as told by your health care provider. Ask your health care provider what activities are safe for you.  Keep all follow-up visits as told by your health care provider. This is important. Contact a health care provider if:  You have chills or a fever.  Your pain does not go away or it gets worse.  You have a cough that does not go away (is persistent). Get help right away if:  You have shortness of breath. This information is not intended to  replace advice given to you by your health care provider. Make sure you discuss any questions you have with your health care provider. Document Released: 01/18/2005 Document Revised: 10/29/2015 Document Reviewed: 08/04/2015 Elsevier Interactive Patient Education  [employer].

## 2017-05-01 NOTE — Progress Notes (Signed)
Name: Lauren Leblanc   MRN: [id number]    DOB: 08-14-1950   Date:05/01/2017       Progress Note  Subjective  Chief Complaint  Chief Complaint  Patient presents with  . Follow-up    d/c from hosp on 04/25/17/ not taking 325mg  Aspirin, only taking 81mg . 'nothing was wrong with my heart, why increase my aspirin'    Followup for recent hospital admission for chest pain.    No problem-specific Assessment & Plan notes found for this encounter.   Past Medical History:  Diagnosis Date  . Angina at rest [facility])   . GERD (gastroesophageal reflux disease)   . Hypertension     Past Surgical History:  Procedure Laterality Date  . BACK SURGERY    . BREAST BIOPSY Bilateral    neg  . TUBAL LIGATION      Family History  Problem Relation Age of Onset  . Coronary artery disease Mother 69  . Hypertension Mother   . Stroke Father   . Diabetes Father   . Hypertension Sister   . Allergies Sister   . Breast cancer Maternal Aunt        3 mat aunts    Social History   Socioeconomic History  . Marital status: Married    Spouse name: Not on file  . Number of children: Not on file  . Years of education: Not on file  . Highest education level: Not on file  Social Needs  . Financial resource strain: Not on file  . Food insecurity - worry: Not on file  . Food insecurity - inability: Not on file  . Transportation needs - medical: Not on file  . Transportation needs - non-medical: Not on file  Occupational History  . Not on file  Tobacco Use  . Smoking status: Never Smoker  . Smokeless tobacco: Never Used  Substance and Sexual Activity  . Alcohol use: No    Alcohol/week: 0.0 oz  . Drug use: No  . Sexual activity: Not Currently  Other Topics Concern  . Not on file  Social History Narrative  . Not on file    Allergies  Allergen Reactions  . Lisinopril Other (See Comments)    Chest pain, dizziness, cough  . Codeine     "hair crawling"  . Hctz [Hydrochlorothiazide]      Jittery feeling    Outpatient Medications Prior to Visit  Medication Sig Dispense Refill  . propranolol (INDERAL) 20 MG tablet Take 1 tablet (20 mg total) by mouth every morning. 90 tablet 3  . aspirin 325 MG tablet Take 1 tablet (325 mg total) by mouth as needed. (Patient not taking: Reported on 05/01/2017)     No facility-administered medications prior to visit.     Review of Systems  Constitutional: Negative for chills, fever, malaise/fatigue and weight loss.  HENT: Negative for ear discharge, ear pain and sore throat.   Eyes: Negative for blurred vision.  Respiratory: Negative for cough, sputum production, shortness of breath and wheezing.   Cardiovascular: Negative for chest pain, palpitations and leg swelling.  Gastrointestinal: Negative for abdominal pain, blood in stool, constipation, diarrhea, heartburn, melena and nausea.  Genitourinary: Negative for dysuria, frequency, hematuria and urgency.  Musculoskeletal: Negative for back pain, joint pain, myalgias and neck pain.  Skin: Negative for rash.  Neurological: Negative for dizziness, tingling, sensory change, focal weakness and headaches.  Endo/Heme/Allergies: Negative for environmental allergies and polydipsia. Does not bruise/bleed easily.  Psychiatric/Behavioral: Negative for depression and suicidal  ideas. The patient is not nervous/anxious and does not have insomnia.      Objective  Vitals:   05/01/17 0946  BP: 120/80  Pulse: 68  Weight: 200 lb (90.7 kg)  Height: 5\' 5"  (1.651 m)    Physical Exam  Constitutional: She is well-developed, well-nourished, and in no distress. No distress.  HENT:  Head: Normocephalic and atraumatic.  Right Ear: External ear normal.  Left Ear: External ear normal.  Nose: Nose normal.  Mouth/Throat: Oropharynx is clear and moist.  Eyes: Conjunctivae and EOM are normal. Pupils are equal, round, and reactive to light. Right eye exhibits no discharge. Left eye exhibits no discharge.   Neck: Normal range of motion. Neck supple. No JVD present. No thyromegaly present.  Cardiovascular: Normal rate, regular rhythm, normal heart sounds and intact distal pulses. Exam reveals no gallop and no friction rub.  No murmur heard. Pulmonary/Chest: Effort normal and breath sounds normal. She has no wheezes. She has no rales. She exhibits tenderness and bony tenderness.  Abdominal: Soft. Bowel sounds are normal. She exhibits no distension and no mass. There is no tenderness. There is no rebound and no guarding.  Musculoskeletal: Normal range of motion. She exhibits no edema.  Lymphadenopathy:    She has no cervical adenopathy.  Neurological: She is alert. She has normal reflexes.  Skin: Skin is warm and dry. She is not diaphoretic.  Psychiatric: Mood and affect normal.  Nursing note and vitals reviewed.     Assessment & Plan  Problem List Items Addressed This Visit      Cardiovascular and Mediastinum   Essential hypertension    Other Visit Diagnoses    Hospital discharge follow-up    -  Primary   Gastroesophageal reflux disease, esophagitis presence not specified       Relevant Medications   ranitidine (ZANTAC) 150 MG capsule   Costochondritis          Meds ordered this encounter  Medications  . ranitidine (ZANTAC) 150 MG capsule    Sig: Take 1 capsule (150 mg total) by mouth every evening.    Dispense:  90 capsule    Refill:  3      Dr. Elizabeth Sauereanna Colton Tassin [facility]  05/01/17

## 2017-05-03 ENCOUNTER — Other Ambulatory Visit: Payer: Self-pay

## 2017-05-15 LAB — NM MYOCAR MULTI W/SPECT W/WALL MOTION / EF
CHL CUP STRESS STAGE 1 GRADE: 0 %
CHL CUP STRESS STAGE 1 HR: 82 {beats}/min
CHL CUP STRESS STAGE 2 HR: 82 {beats}/min
CHL CUP STRESS STAGE 3 HR: 133 {beats}/min
CHL CUP STRESS STAGE 4 GRADE: 12 %
CHL CUP STRESS STAGE 4 HR: 141 {beats}/min
CHL CUP STRESS STAGE 6 GRADE: 0 %
CHL CUP STRESS STAGE 6 SPEED: 0 mph
CSEPED: 3 min
CSEPEDS: 32 s
CSEPHR: 91 %
CSEPPHR: 141 {beats}/min
Estimated workload: 5.2 METS
LV dias vol: 89 mL (ref 46–106)
LVSYSVOL: 26 mL
MPHR: 156 {beats}/min
NUC STRESS TID: 0.78
Percent of predicted max HR: 90 %
Rest HR: 66 {beats}/min
SDS: 5
SRS: 5
SSS: 8
Stage 1 Speed: 0 mph
Stage 2 Grade: 0.1 %
Stage 2 Speed: 0 mph
Stage 3 Grade: 10 %
Stage 3 Speed: 1.7 mph
Stage 4 Speed: 2.5 mph
Stage 5 Grade: 0 %
Stage 5 HR: 126 {beats}/min
Stage 5 Speed: 0 mph
Stage 6 DBP: 46 mmHg
Stage 6 HR: 90 {beats}/min
Stage 6 SBP: 168 mmHg

## 2017-06-26 DIAGNOSIS — E782 Mixed hyperlipidemia: Secondary | ICD-10-CM | POA: Diagnosis not present

## 2017-06-26 DIAGNOSIS — I10 Essential (primary) hypertension: Secondary | ICD-10-CM | POA: Diagnosis not present

## 2017-06-27 ENCOUNTER — Other Ambulatory Visit: Payer: Self-pay

## 2017-06-27 ENCOUNTER — Telehealth: Payer: Self-pay

## 2017-06-27 ENCOUNTER — Encounter: Payer: Self-pay | Admitting: Emergency Medicine

## 2017-06-27 ENCOUNTER — Emergency Department
Admission: EM | Admit: 2017-06-27 | Discharge: 2017-06-27 | Disposition: A | Discharge status: Home / Self Care | Payer: Medicare HMO | Attending: Emergency Medicine | Admitting: Emergency Medicine

## 2017-06-27 DIAGNOSIS — I10 Essential (primary) hypertension: Secondary | ICD-10-CM | POA: Insufficient documentation

## 2017-06-27 DIAGNOSIS — Z79899 Other long term (current) drug therapy: Secondary | ICD-10-CM | POA: Diagnosis not present

## 2017-06-27 DIAGNOSIS — K625 Hemorrhage of anus and rectum: Secondary | ICD-10-CM | POA: Insufficient documentation

## 2017-06-27 LAB — CBC
HCT: 38.4 % (ref 35.0–47.0)
Hemoglobin: 12.9 g/dL (ref 12.0–16.0)
MCH: 29.4 pg (ref 26.0–34.0)
MCHC: 33.5 g/dL (ref 32.0–36.0)
MCV: 87.7 fL (ref 80.0–100.0)
PLATELETS: 290 10*3/uL (ref 150–440)
RBC: 4.38 MIL/uL (ref 3.80–5.20)
RDW: 13.6 % (ref 11.5–14.5)
WBC: 6.3 10*3/uL (ref 3.6–11.0)

## 2017-06-27 LAB — COMPREHENSIVE METABOLIC PANEL
ALK PHOS: 66 U/L (ref 38–126)
ALT: 19 U/L (ref 14–54)
AST: 33 U/L (ref 15–41)
Albumin: 4.5 g/dL (ref 3.5–5.0)
Anion gap: 8 (ref 5–15)
BILIRUBIN TOTAL: 0.9 mg/dL (ref 0.3–1.2)
BUN: 25 mg/dL — ABNORMAL HIGH (ref 6–20)
CALCIUM: 9.8 mg/dL (ref 8.9–10.3)
CO2: 26 mmol/L (ref 22–32)
CREATININE: 0.72 mg/dL (ref 0.44–1.00)
Chloride: 105 mmol/L (ref 101–111)
GFR calc Af Amer: >60 mL/min (ref >60)
GFR calc non Af Amer: >60 mL/min (ref >60)
GLUCOSE: 96 mg/dL (ref 65–99)
Potassium: 4.8 mmol/L (ref 3.5–5.1)
SODIUM: 139 mmol/L (ref 135–145)
TOTAL PROTEIN: 7.6 g/dL (ref 6.5–8.1)

## 2017-06-27 LAB — TYPE AND SCREEN
ABO/RH(D): O NEG
ANTIBODY SCREEN: NEGATIVE

## 2017-06-27 NOTE — ED Triage Notes (Signed)
Pt states she had bright red blood in her stool this am, has never had this before, states she has been having a nagging cramping in her lower abdomen today, states other than that, she has no complaints. Denies N,V,D.

## 2017-06-27 NOTE — ED Notes (Signed)
Pt reports bright red blood covering her stool, in the toilet, and the toilet tissue this am, pt denies history of this prior, pt reports some abd discomfort for the past few days, denies any hx of ibs or diverticulitis. Pt denies hx of colonoscopy in the past. Pt reports taking a baby asa daily.

## 2017-06-27 NOTE — ED Notes (Signed)
Pt sitting up in bed watching tv, no distress noted, states that she is fine and doesn't need anything at this time, cont to monitor

## 2017-06-27 NOTE — ED Provider Notes (Signed)
[facility] Emergency Department Provider Note   ____________________________________________   First MD Initiated Contact with Patient 06/27/17 1518     (approximate)  I have reviewed the triage vital signs and the nursing notes.   HISTORY  Chief Complaint Rectal Bleeding   HPI Lauren Leblanc is a 67 y.o. female Patient reports she had some bright red blood covering her stool in the toilet this morning and on the toilet tissue. She denies ever having this before. She has had some crampy abdominal pain for the last couple days. Is very mild. She does take baby aspirin every day. She has no epigastric pain nausea or vomiting.   Past Medical History:  Diagnosis Date  . Angina at rest [facility](HCC)   . GERD (gastroesophageal reflux disease)   . Hypertension     Patient Active Problem List   Diagnosis Date Noted  . Chest pain 04/24/2017  . Essential hypertension 01/06/2016    Past Surgical History:  Procedure Laterality Date  . BACK SURGERY    . BREAST BIOPSY Bilateral    neg  . TUBAL LIGATION      Prior to Admission medications   Medication Sig Start Date End Date Taking? Authorizing Provider  aspirin 325 MG tablet Take 1 tablet (325 mg total) by mouth as needed. Patient not taking: Reported on 05/01/2017 03/27/17   Duanne LimerickJones, Deanna C, MD  propranolol (INDERAL) 20 MG tablet Take 1 tablet (20 mg total) by mouth every morning. 02/13/17   Duanne LimerickJones, Deanna C, MD  ranitidine (ZANTAC) 150 MG capsule Take 1 capsule (150 mg total) by mouth every evening. 05/01/17   Duanne LimerickJones, Deanna C, MD    Allergies Lisinopril; Codeine; and Hctz [hydrochlorothiazide]  Family History  Problem Relation Age of Onset  . Coronary artery disease Mother [id number]  . Hypertension Mother   . Stroke Father   . Diabetes Father   . Hypertension Sister   . Allergies Sister   . Breast cancer Maternal Aunt        3 mat aunts    Social History Social History   Tobacco Use  . Smoking  status: Never Smoker  . Smokeless tobacco: Never Used  Substance Use Topics  . Alcohol use: No    Alcohol/week: 0.0 oz  . Drug use: No    Review of Systems  Constitutional: No fever/chills Eyes: No visual changes. ENT: No sore throat. Cardiovascular: Denies chest pain. Respiratory: Denies shortness of breath. Gastrointestinal: see history of present illness Genitourinary: Negative for dysuria. Musculoskeletal: Negative for back pain. Skin: Negative for rash. Neurological: Negative for headaches, focal weakness  ____________________________________________   PHYSICAL EXAM:  VITAL SIGNS: ED Triage Vitals  Enc Vitals Group     BP 06/27/17 1424 (!) 151/70     Pulse Rate 06/27/17 1424 65     Resp 06/27/17 1424 18     Temp 06/27/17 1424 97.6 F (36.4 C)     Temp Source 06/27/17 1424 Oral     SpO2 06/27/17 1424 100 %     Weight 06/27/17 1425 185 lb (83.9 kg)     Height 06/27/17 1425 5\' 5"  (1.651 m)     Head Circumference --      Peak Flow --      Pain Score 06/27/17 1424 2     Pain Loc --      Pain Edu? --      Excl. in GC? --     Constitutional: Alert and oriented. Well appearing  and in no acute distress. Eyes: Conjunctivae are normal.  Head: Atraumatic. Nose: No congestion/rhinnorhea. Mouth/Throat: Mucous membranes are moist.  Oropharynx non-erythematous. Neck: No stridor.  Cardiovascular: Normal rate, regular rhythm. Grossly normal heart sounds.  Good peripheral circulation. Respiratory: Normal respiratory effort.  No retractions. Lungs CTAB. Gastrointestinal: Soft andminimal lower abdominal tenderness to palpation only. No distention. No abdominal bruits. No CVA tenderness. rectal: Old hemorrhoidal tags outside the rectum but no hemorrhoids are noted. On rectal exam itself there are some soft areas that seem like they could be small hemorrhoids. there are no masses inside the rectum itself. When I withdraw my finger there is a small amount of stool on it that is  Hemoccult-positive Musculoskeletal: No lower extremity tenderness nor edema.  No joint effusions. Neurologic:  Normal speech and language. No gross focal neurologic deficits are appreciated. No gait instability. Skin:  Skin is warm, dry and intact. No rash noted. Psychiatric: Mood and affect are normal. Speech and behavior are normal.  ____________________________________________   LABS (all labs ordered are listed, but only abnormal results are displayed)  Labs Reviewed  COMPREHENSIVE METABOLIC PANEL - Abnormal; Notable for the following components:      Result Value   BUN 25 (*)    All other components within normal limits  CBC  POC OCCULT BLOOD, ED  TYPE AND SCREEN   ____________________________________________  EKG   ____________________________________________  RADIOLOGY  ED MD interpretation:   Official radiology report(s): No results found.  ____________________________________________   PROCEDURES  Procedure(s) performed:   Procedures  Critical Care performed:   ____________________________________________   INITIAL IMPRESSION / ASSESSMENT AND PLAN / ED COURSE  patient is not lightheaded feels well. She's only had 1 episode. She does not look pale she's not tachycardic. I discussed the patient with Dr. Norma Fredrickson on call for gastroenterology he feels she can be followed up in the office I agree.     Clinical Course as of Jun 28 1534  Wed Jun 27, 2017  1518 RBC: 4.38 [PM]  1533 RBC: 4.38 [PM]  1533 RBC: 4.38 [PM]    Clinical Course User Index [PM] Arnaldo Natal, MD     ____________________________________________   FINAL CLINICAL IMPRESSION(S) / ED DIAGNOSES  Final diagnoses:  Rectal bleeding     ED Discharge Orders    None       Note:  This document was prepared using Dragon voice recognition software and may include unintentional dictation errors.    Arnaldo Natal, MD 06/27/17 1539

## 2017-06-27 NOTE — ED Notes (Signed)
Rectal exam performed by dr Darnelle Catalanmalinda, witnessed by this RN, no distress noted with pt, no frank blood noted, hemmocult card used and positive for blood on card

## 2017-06-27 NOTE — Discharge Instructions (Signed)
please call Dr. Horace Porteousoledo's office. Tell them you were in the emergency room and the ER doctor spoke to Dr. Norma Fredricksonoledo and he said he wanted to see you this week. They will get you an appointment. Please return here for worse pain fever vomiting lightheadedness or more blood per rectum.

## 2017-06-27 NOTE — ED Notes (Signed)
FN: pt sent over by PCP for further eval of rectal bleeding.

## 2017-06-27 NOTE — Telephone Encounter (Signed)
Pt called with complaints of blood in stool. Described as "a lot, not just when I wipe"- Dr Yetta BarreJones said it is best to go to ER for hemoglobin draws and further eval. Pt has had GI issues in past as well. Spoke to pt on phone and advised her to go to ER, as they have means to do repeat hemoglobin levels and she may be losing blood that is not visible

## 2017-06-27 NOTE — ED Notes (Signed)
Pt oriented to the call bell, warm blanket given for comfort, denies that any family are here with her but has notified her sister that she is being seen in the ED. Remote given and tv turned on for pt, communicated that Dr Darnelle CatalanMalinda will be her ER dr and would be into see her soon

## 2017-06-28 DIAGNOSIS — K625 Hemorrhage of anus and rectum: Secondary | ICD-10-CM | POA: Diagnosis not present

## 2017-07-16 DIAGNOSIS — K648 Other hemorrhoids: Secondary | ICD-10-CM | POA: Diagnosis not present

## 2017-07-16 DIAGNOSIS — K625 Hemorrhage of anus and rectum: Secondary | ICD-10-CM | POA: Diagnosis not present

## 2017-07-16 DIAGNOSIS — K621 Rectal polyp: Secondary | ICD-10-CM | POA: Diagnosis not present

## 2017-07-16 DIAGNOSIS — K620 Anal polyp: Secondary | ICD-10-CM | POA: Diagnosis not present

## 2017-07-16 DIAGNOSIS — K640 First degree hemorrhoids: Secondary | ICD-10-CM | POA: Diagnosis not present

## 2017-07-16 DIAGNOSIS — K5730 Diverticulosis of large intestine without perforation or abscess without bleeding: Secondary | ICD-10-CM | POA: Diagnosis not present

## 2017-07-16 DIAGNOSIS — K635 Polyp of colon: Secondary | ICD-10-CM | POA: Diagnosis not present

## 2017-08-09 ENCOUNTER — Other Ambulatory Visit: Payer: Self-pay

## 2017-10-16 ENCOUNTER — Other Ambulatory Visit: Payer: Self-pay | Admitting: Family Medicine

## 2017-10-16 DIAGNOSIS — N6320 Unspecified lump in the left breast, unspecified quadrant: Secondary | ICD-10-CM

## 2017-11-08 ENCOUNTER — Other Ambulatory Visit: Payer: Self-pay | Admitting: Family Medicine

## 2018-02-01 DIAGNOSIS — R69 Illness, unspecified: Secondary | ICD-10-CM | POA: Diagnosis not present

## 2018-02-20 ENCOUNTER — Ambulatory Visit (INDEPENDENT_AMBULATORY_CARE_PROVIDER_SITE_OTHER): Payer: Medicare HMO | Admitting: Family Medicine

## 2018-02-20 ENCOUNTER — Ambulatory Visit: Payer: Self-pay | Admitting: Family Medicine

## 2018-02-20 ENCOUNTER — Encounter: Payer: Self-pay | Admitting: Family Medicine

## 2018-02-20 VITALS — BP 130/98 | HR 60 | Ht 65.0 in | Wt 187.0 lb

## 2018-02-20 DIAGNOSIS — I10 Essential (primary) hypertension: Secondary | ICD-10-CM

## 2018-02-20 DIAGNOSIS — S83282A Other tear of lateral meniscus, current injury, left knee, initial encounter: Secondary | ICD-10-CM | POA: Diagnosis not present

## 2018-02-20 DIAGNOSIS — K219 Gastro-esophageal reflux disease without esophagitis: Secondary | ICD-10-CM | POA: Diagnosis not present

## 2018-02-20 MED ORDER — RANITIDINE HCL 150 MG PO CAPS: 150.0000 mg | ORAL_CAPSULE | Freq: Every evening | ORAL | 3 refills | Status: DC

## 2018-02-20 MED ORDER — MELOXICAM 7.5 MG PO TABS: 7.5000 mg | ORAL_TABLET | Freq: Every day | ORAL | 0 refills | Status: DC

## 2018-02-20 MED ORDER — PROPRANOLOL HCL 20 MG PO TABS: 20.0000 mg | ORAL_TABLET | Freq: Two times a day (BID) | ORAL | 3 refills | Status: DC

## 2018-02-20 NOTE — Progress Notes (Signed)
Date:  02/20/2018   Name:  Lauren Leblanc   DOB:  [birth year]   MRN:  [id number]   Chief Complaint: Hypertension; Gastroesophageal Reflux; and Knee Pain (L) knee- wants referral to ortho) Hypertension  This is a chronic problem. The current episode started more than 1 year ago. The problem is unchanged. The problem is controlled. Associated symptoms include anxiety, chest pain and headaches. Pertinent negatives include no blurred vision, malaise/fatigue, neck pain, orthopnea, palpitations, peripheral edema, PND, shortness of breath or sweats. (Stress) Risk factors for coronary artery disease include dyslipidemia and stress. Past treatments include beta blockers. The current treatment provides mild improvement. There are no compliance problems.  There is no history of angina, kidney disease, CAD/MI, CVA, heart failure, left ventricular hypertrophy, PVD or retinopathy. There is no history of chronic renal disease, a hypertension causing med or renovascular disease.  Gastroesophageal Reflux  She complains of chest pain. She reports no abdominal pain, no coughing, no dysphagia, no nausea, no sore throat or no wheezing. This is a chronic problem. The current episode started more than 1 month ago. The symptoms are aggravated by certain foods. Pertinent negatives include no fatigue. She has tried a histamine-2 antagonist for the symptoms. The treatment provided moderate relief.  Knee Pain   There was no injury mechanism. The pain is present in the left knee. The quality of the pain is described as aching. The pain is at a severity of 7/10. The pain is moderate. The pain has been constant since onset. Pertinent negatives include no numbness. The symptoms are aggravated by movement (sitting/twisting to get out of truck). The treatment provided moderate relief.     Review of Systems  Constitutional: Negative.  Negative for chills, fatigue, fever, malaise/fatigue and unexpected weight change.  HENT:  Negative for congestion, ear discharge, ear pain, rhinorrhea, sinus pressure, sneezing and sore throat.   Eyes: Negative for blurred vision, photophobia, pain, discharge, redness and itching.  Respiratory: Negative for cough, shortness of breath, wheezing and stridor.   Cardiovascular: Positive for chest pain. Negative for palpitations, orthopnea and PND.  Gastrointestinal: Negative for abdominal pain, blood in stool, constipation, diarrhea, dysphagia, nausea and vomiting.  Endocrine: Negative for cold intolerance, heat intolerance, polydipsia, polyphagia and polyuria.  Genitourinary: Negative for dysuria, flank pain, frequency, hematuria, menstrual problem, pelvic pain, urgency, vaginal bleeding and vaginal discharge.  Musculoskeletal: Negative for arthralgias, back pain, myalgias and neck pain.  Skin: Negative for rash.  Allergic/Immunologic: Negative for environmental allergies and food allergies.  Neurological: Positive for headaches. Negative for dizziness, weakness, light-headedness and numbness.  Hematological: Negative for adenopathy. Does not bruise/bleed easily.  Psychiatric/Behavioral: Negative for dysphoric mood. The patient is not nervous/anxious.     Patient Active Problem List   Diagnosis Date Noted  . Chest pain 04/24/2017  . Essential hypertension 01/06/2016    Allergies  Allergen Reactions  . Lisinopril Other (See Comments)    Chest pain, dizziness, cough  . Codeine     "hair crawling"  . Hctz [Hydrochlorothiazide]     Jittery feeling    Past Surgical History:  Procedure Laterality Date  . BACK SURGERY    . BREAST BIOPSY Bilateral    neg  . TUBAL LIGATION      Social History   Tobacco Use  . Smoking status: Never Smoker  . Smokeless tobacco: Never Used  Substance Use Topics  . Alcohol use: No    Alcohol/week: 0.0 standard drinks  . Drug use: No  Medication list has been reviewed and updated.  Current Meds  Medication Sig  . propranolol  (INDERAL) 20 MG tablet Take 1 tablet (20 mg total) by mouth 2 (two) times daily.  . ranitidine (ZANTAC) 150 MG capsule Take 1 capsule (150 mg total) by mouth every evening.  . [DISCONTINUED] propranolol (INDERAL) 20 MG tablet Take 1 tablet (20 mg total) by mouth every morning.  . [DISCONTINUED] ranitidine (ZANTAC) 150 MG capsule Take 1 capsule (150 mg total) by mouth every evening.    PHQ 2/9 Scores 02/20/2018 02/01/2017 08/12/2015 06/21/2015  PHQ - 2 Score 0 0 0 0  PHQ- 9 Score - 1 - -    Physical Exam  Constitutional: No distress.  HENT:  Head: Normocephalic and atraumatic.  Right Ear: External ear normal.  Left Ear: External ear normal.  Nose: Nose normal.  Mouth/Throat: Oropharynx is clear and moist.  Eyes: Pupils are equal, round, and reactive to light. Conjunctivae and EOM are normal. Right eye exhibits no discharge. Left eye exhibits no discharge.  Neck: Normal range of motion. Neck supple. No JVD present. No thyromegaly present.  Cardiovascular: Normal rate, regular rhythm, normal heart sounds and intact distal pulses. Exam reveals no gallop and no friction rub.  No murmur heard. Pulmonary/Chest: Effort normal and breath sounds normal.  Abdominal: Soft. Bowel sounds are normal. She exhibits no mass. There is no tenderness. There is no guarding.  Musculoskeletal: She exhibits no edema.       Left knee: She exhibits decreased range of motion. She exhibits no LCL laxity, normal patellar mobility and no MCL laxity. Tenderness found. Medial joint line and lateral joint line tenderness noted. No MCL, no LCL and no patellar tendon tenderness noted.  Tender popliteal bursa/extension block  Lymphadenopathy:    She has no cervical adenopathy.  Neurological: She is alert. She has normal reflexes.  Skin: Skin is warm and dry. She is not diaphoretic.    BP (!) 130/98   Pulse 60   Ht 5\' 5"  (1.651 m)   Wt 187 lb (84.8 kg)   BMI 31.12 kg/m   Assessment and Plan:  1. Essential  hypertension Stable on med- refill propranolol/ draw renal and lipid - propranolol (INDERAL) 20 MG tablet; Take 1 tablet (20 mg total) by mouth 2 (two) times daily.  Dispense: 180 tablet; Refill: 3 - Renal function panel - Lipid Panel With LDL/HDL Ratio  2. Acute lateral meniscus tear of left knee, initial encounter Refer to ortho per pt request and start meloxicam - Ambulatory referral to Orthopedic Surgery - meloxicam (MOBIC) 7.5 MG tablet; Take 1 tablet (7.5 mg total) by mouth daily.  Dispense: 30 tablet; Refill: 0  3. Gastroesophageal reflux disease, esophagitis presence not specified Stable on med- refill ranitidine - ranitidine (ZANTAC) 150 MG capsule; Take 1 capsule (150 mg total) by mouth every evening.  Dispense: 90 capsule; Refill: 3   Dr. Hayden [facility]  02/20/2018

## 2018-02-21 LAB — LIPID PANEL WITH LDL/HDL RATIO
Cholesterol, Total: 213 mg/dL — ABNORMAL HIGH (ref 100–199)
HDL: 58 mg/dL (ref >39)
LDL Calculated: 128 mg/dL — ABNORMAL HIGH (ref 0–99)
LDl/HDL Ratio: 2.2 ratio (ref 0.0–3.2)
Triglycerides: 133 mg/dL (ref 0–149)
VLDL Cholesterol Cal: 27 mg/dL (ref 5–40)

## 2018-02-21 LAB — RENAL FUNCTION PANEL
Albumin: 4.7 g/dL (ref 3.6–4.8)
BUN/Creatinine Ratio: 22 (ref 12–28)
BUN: 16 mg/dL (ref 8–27)
CO2: 22 mmol/L (ref 20–29)
Calcium: 9.6 mg/dL (ref 8.7–10.3)
Chloride: 104 mmol/L (ref 96–106)
Creatinine, Ser: 0.74 mg/dL (ref 0.57–1.00)
GFR calc Af Amer: 98 mL/min/{1.73_m2} (ref >59)
GFR calc non Af Amer: 85 mL/min/{1.73_m2} (ref >59)
Glucose: 86 mg/dL (ref 65–99)
Phosphorus: 3.5 mg/dL (ref 2.5–4.5)
Potassium: 4.5 mmol/L (ref 3.5–5.2)
Sodium: 142 mmol/L (ref 134–144)

## 2018-03-13 DIAGNOSIS — M1712 Unilateral primary osteoarthritis, left knee: Secondary | ICD-10-CM | POA: Insufficient documentation

## 2018-03-13 DIAGNOSIS — E669 Obesity, unspecified: Secondary | ICD-10-CM | POA: Diagnosis not present

## 2018-03-13 DIAGNOSIS — G8929 Other chronic pain: Secondary | ICD-10-CM | POA: Diagnosis not present

## 2018-03-13 DIAGNOSIS — M25562 Pain in left knee: Secondary | ICD-10-CM | POA: Diagnosis not present

## 2018-03-13 DIAGNOSIS — M25561 Pain in right knee: Secondary | ICD-10-CM | POA: Diagnosis not present

## 2018-03-13 DIAGNOSIS — M170 Bilateral primary osteoarthritis of knee: Secondary | ICD-10-CM | POA: Diagnosis not present

## 2018-03-13 DIAGNOSIS — M25362 Other instability, left knee: Secondary | ICD-10-CM | POA: Diagnosis not present

## 2018-03-15 ENCOUNTER — Other Ambulatory Visit: Payer: Self-pay | Admitting: Orthopedic Surgery

## 2018-03-15 DIAGNOSIS — M25362 Other instability, left knee: Secondary | ICD-10-CM

## 2018-04-04 ENCOUNTER — Ambulatory Visit
Admission: RE | Admit: 2018-04-04 | Discharge: 2018-04-04 | Disposition: A | Discharge status: Home / Self Care | Payer: Medicare HMO | Source: Ambulatory Visit | Attending: Orthopedic Surgery | Admitting: Orthopedic Surgery

## 2018-04-04 DIAGNOSIS — M1712 Unilateral primary osteoarthritis, left knee: Secondary | ICD-10-CM | POA: Insufficient documentation

## 2018-04-04 DIAGNOSIS — G8929 Other chronic pain: Secondary | ICD-10-CM | POA: Diagnosis not present

## 2018-04-04 DIAGNOSIS — M25462 Effusion, left knee: Secondary | ICD-10-CM | POA: Insufficient documentation

## 2018-04-04 DIAGNOSIS — M25562 Pain in left knee: Secondary | ICD-10-CM | POA: Insufficient documentation

## 2018-04-04 DIAGNOSIS — S83242A Other tear of medial meniscus, current injury, left knee, initial encounter: Secondary | ICD-10-CM | POA: Diagnosis not present

## 2018-04-04 DIAGNOSIS — M659 Synovitis and tenosynovitis, unspecified: Secondary | ICD-10-CM | POA: Insufficient documentation

## 2018-04-04 DIAGNOSIS — M25362 Other instability, left knee: Secondary | ICD-10-CM | POA: Diagnosis not present

## 2018-04-30 DIAGNOSIS — M1712 Unilateral primary osteoarthritis, left knee: Secondary | ICD-10-CM | POA: Diagnosis not present

## 2018-04-30 DIAGNOSIS — E669 Obesity, unspecified: Secondary | ICD-10-CM | POA: Diagnosis not present

## 2018-05-09 DIAGNOSIS — R69 Illness, unspecified: Secondary | ICD-10-CM | POA: Diagnosis not present

## 2018-05-16 DIAGNOSIS — R69 Illness, unspecified: Secondary | ICD-10-CM | POA: Diagnosis not present

## 2018-07-03 ENCOUNTER — Ambulatory Visit (INDEPENDENT_AMBULATORY_CARE_PROVIDER_SITE_OTHER): Payer: Medicare HMO | Admitting: Family Medicine

## 2018-07-03 ENCOUNTER — Other Ambulatory Visit: Payer: Self-pay

## 2018-07-03 ENCOUNTER — Encounter: Payer: Self-pay | Admitting: Family Medicine

## 2018-07-03 VITALS — BP 122/80 | HR 64 | Ht 65.0 in | Wt 195.0 lb

## 2018-07-03 DIAGNOSIS — L237 Allergic contact dermatitis due to plants, except food: Secondary | ICD-10-CM

## 2018-07-03 MED ORDER — PREDNISONE 10 MG PO TABS: ORAL_TABLET | ORAL | 1 refills | Status: DC

## 2018-07-03 NOTE — Progress Notes (Signed)
Date:  07/03/2018   Name:  Lauren Leblanc   DOB:  11-12-1950   MRN:  [id number]   Chief Complaint: Rash (poison ivy around R) eye and face, arms and legs)  Rash  This is a new problem. The current episode started in the past 7 days (Monday night). The problem has been gradually worsening since onset. The affected locations include the left fingers, face, right upper leg and right arm. The rash is characterized by itchiness and swelling. She was exposed to plant contact. Pertinent negatives include no congestion, cough, diarrhea, eye pain, fatigue, fever, rhinorrhea, shortness of breath, sore throat or vomiting. Past treatments include nothing. The treatment provided mild relief.    Review of Systems  Constitutional: Negative.  Negative for chills, fatigue, fever and unexpected weight change.  HENT: Negative for congestion, ear discharge, ear pain, rhinorrhea, sinus pressure, sneezing and sore throat.   Eyes: Negative for photophobia, pain, discharge, redness and itching.  Respiratory: Negative for cough, shortness of breath, wheezing and stridor.   Gastrointestinal: Negative for abdominal pain, blood in stool, constipation, diarrhea, nausea and vomiting.  Endocrine: Negative for cold intolerance, heat intolerance, polydipsia, polyphagia and polyuria.  Genitourinary: Negative for dysuria, flank pain, frequency, hematuria, menstrual problem, pelvic pain, urgency, vaginal bleeding and vaginal discharge.  Musculoskeletal: Negative for arthralgias, back pain and myalgias.  Skin: Positive for rash.  Allergic/Immunologic: Negative for environmental allergies and food allergies.  Neurological: Negative for dizziness, weakness, light-headedness, numbness and headaches.  Hematological: Negative for adenopathy. Does not bruise/bleed easily.  Psychiatric/Behavioral: Negative for dysphoric mood. The patient is not nervous/anxious.     Patient Active Problem List   Diagnosis Date Noted  .  Chest pain 04/24/2017  . Essential hypertension 01/06/2016    Allergies  Allergen Reactions  . Lisinopril Other (See Comments)    Chest pain, dizziness, cough  . Codeine     "hair crawling"  . Hctz [Hydrochlorothiazide]     Jittery feeling    Past Surgical History:  Procedure Laterality Date  . BACK SURGERY    . BREAST BIOPSY Bilateral    neg  . TUBAL LIGATION      Social History   Tobacco Use  . Smoking status: Never Smoker  . Smokeless tobacco: Never Used  Substance Use Topics  . Alcohol use: No    Alcohol/week: 0.0 standard drinks  . Drug use: No     Medication list has been reviewed and updated.  Current Meds  Medication Sig  . propranolol (INDERAL) 20 MG tablet Take 1 tablet (20 mg total) by mouth 2 (two) times daily.  . ranitidine (ZANTAC) 150 MG capsule Take 1 capsule (150 mg total) by mouth every evening.    PHQ 2/9 Scores 02/20/2018 02/01/2017 08/12/2015 06/21/2015  PHQ - 2 Score 0 0 0 0  PHQ- 9 Score - 1 - -    Physical Exam Vitals signs and nursing note reviewed.  Constitutional:      General: She is not in acute distress.    Appearance: She is not diaphoretic.  HENT:     Head: Normocephalic and atraumatic.     Right Ear: External ear normal.     Left Ear: External ear normal.     Nose: Nose normal.  Eyes:     General:        Right eye: No discharge.        Left eye: No discharge.     Conjunctiva/sclera: Conjunctivae normal.  Pupils: Pupils are equal, round, and reactive to light.  Neck:     Musculoskeletal: Normal range of motion and neck supple.     Thyroid: No thyromegaly.     Vascular: No JVD.  Cardiovascular:     Rate and Rhythm: Normal rate and regular rhythm.     Heart sounds: Normal heart sounds. No murmur. No friction rub. No gallop.   Pulmonary:     Effort: Pulmonary effort is normal.     Breath sounds: Normal breath sounds.  Abdominal:     General: Bowel sounds are normal. There is no distension.     Palpations: Abdomen  is soft. There is no mass.     Tenderness: There is no abdominal tenderness. There is no guarding or rebound.     Hernia: No hernia is present.  Musculoskeletal: Normal range of motion.  Lymphadenopathy:     Cervical: No cervical adenopathy.  Skin:    General: Skin is warm and dry.     Findings: Rash present.  Neurological:     Mental Status: She is alert.     Deep Tendon Reflexes: Reflexes are normal and symmetric.     Wt Readings from Last 3 Encounters:  07/03/18 195 lb (88.5 kg)  02/20/18 187 lb (84.8 kg)  06/27/17 185 lb (83.9 kg)    BP 122/80   Pulse 64   Ht 5\' 5"  (1.651 m)   Wt 195 lb (88.5 kg)   SpO2 97%   BMI 32.45 kg/m   Assessment and Plan: 1. Contact dermatitis due to poison oak New onset [place] areas of involvement by contact dermatitis presumably from poison oak.  Patient will start prednisone taper at 60 mg and taper over 2-week.  Refill provided if does not resolve or if recurrence within 4 to 6 weeks. - predniSONE (DELTASONE) 10 MG tablet; Taper 6,6,6,5,5,5,4,4,3,3,2,2,1,1  Dispense: 53 tablet; Refill: 1

## 2018-07-11 ENCOUNTER — Encounter: Payer: Medicare HMO | Admitting: Family Medicine

## 2018-07-23 ENCOUNTER — Other Ambulatory Visit: Payer: Self-pay

## 2018-07-23 DIAGNOSIS — K219 Gastro-esophageal reflux disease without esophagitis: Secondary | ICD-10-CM

## 2018-07-23 MED ORDER — RANITIDINE HCL 150 MG PO CAPS
150.0000 mg | ORAL_CAPSULE | Freq: Every evening | ORAL | 0 refills | Status: DC
Start: 2018-07-23 — End: 2018-09-11

## 2018-07-23 NOTE — Progress Notes (Unsigned)
Sent in ranitidine 

## 2018-08-23 IMAGING — US US BREAST*R* LIMITED INC AXILLA
1 series · 5 of 5 positions shown · non-contrast
Comparison: Previous exam(s).

CLINICAL DATA: Callback from screening mammogram for possible
asymmetries in both breasts.

EXAM:
2D DIGITAL DIAGNOSTIC BILATERAL MAMMOGRAM WITH CAD AND ADJUNCT TOMO
ULTRASOUND BILATERAL BREAST

[Series 2: us breast*right* limited inc axilla · 0.07mm/px · 5 of 5 slices shown]
[im 1/5]
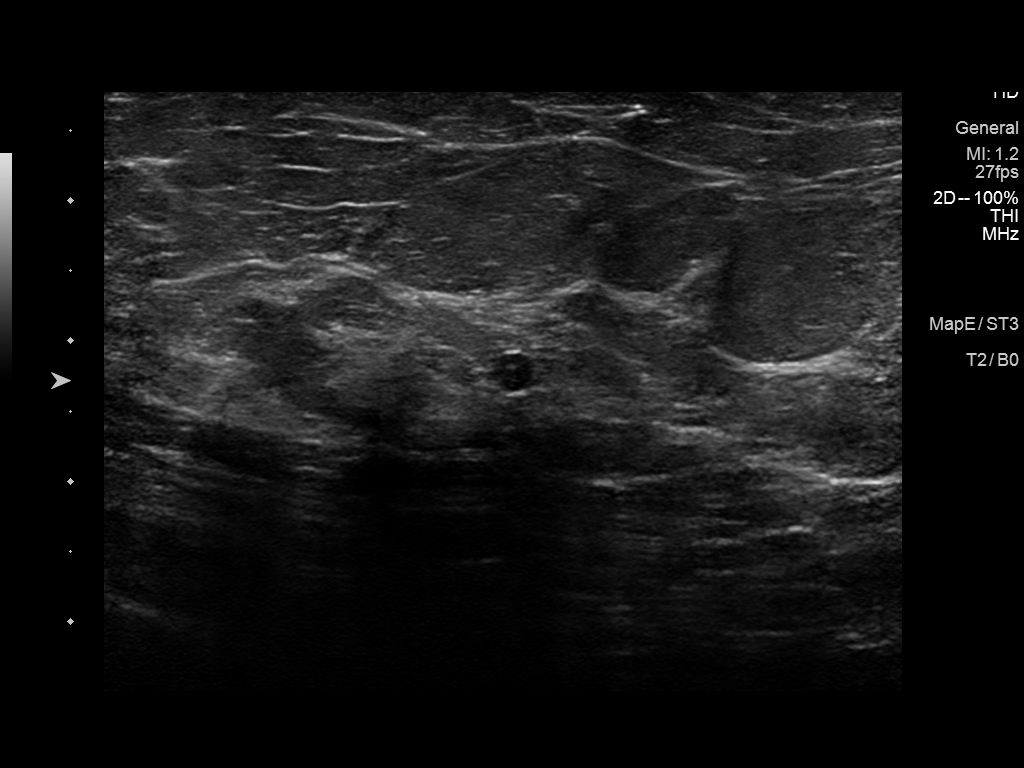
[im 2/5]
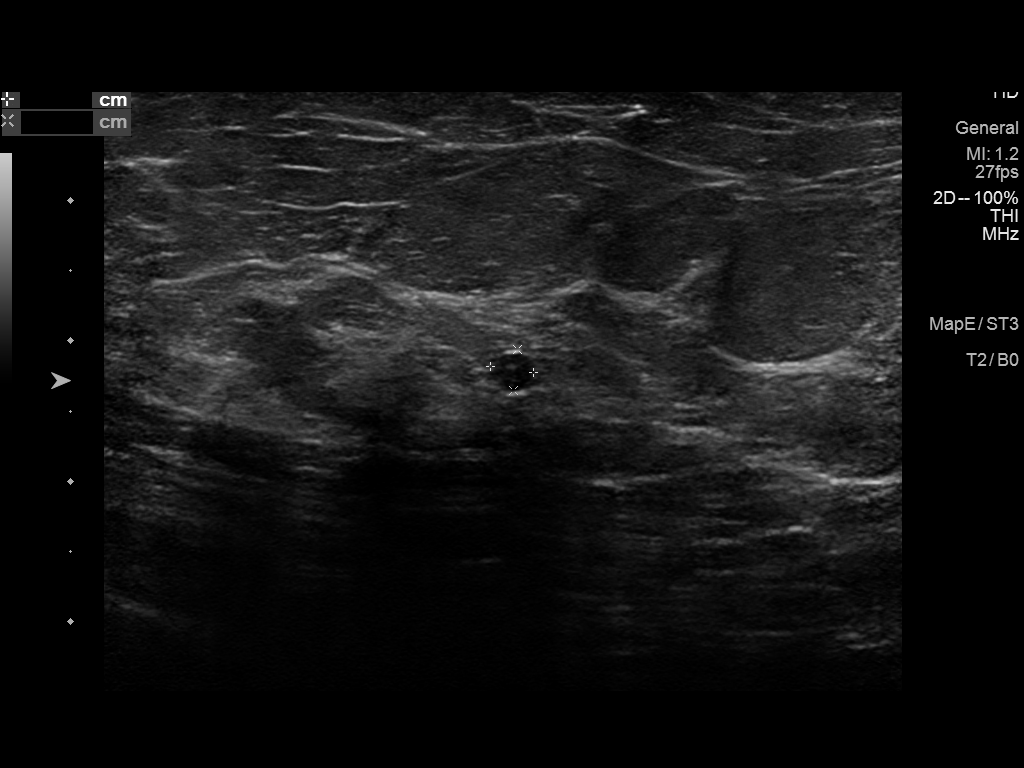
[im 3/5]
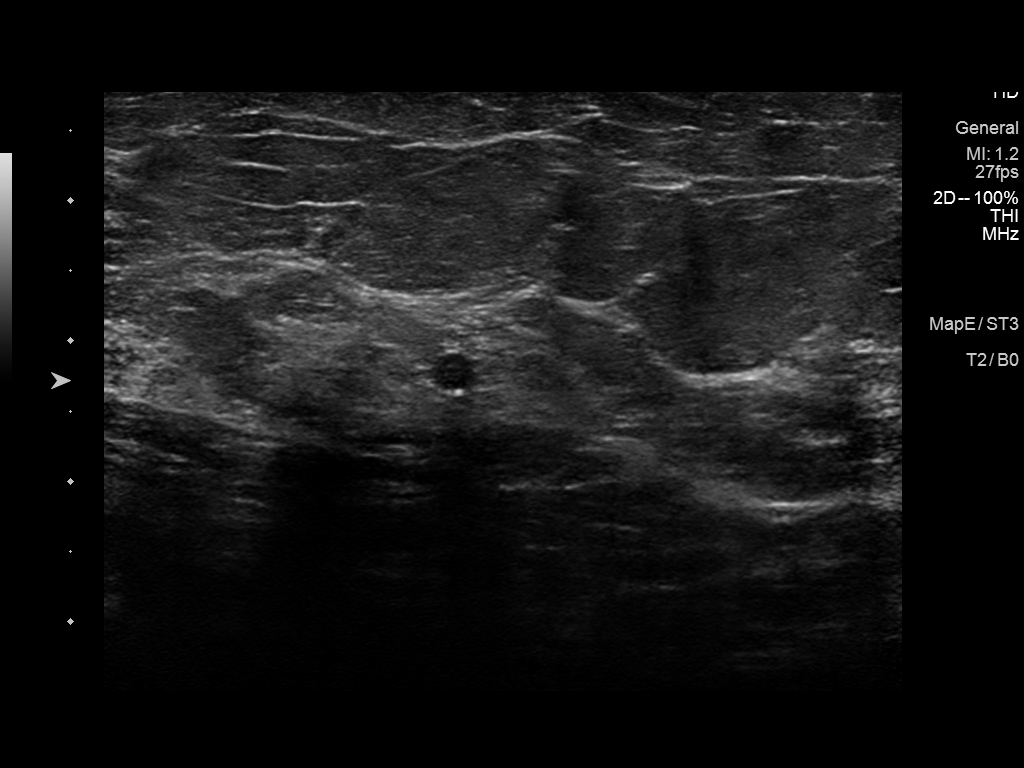
[im 4/5]
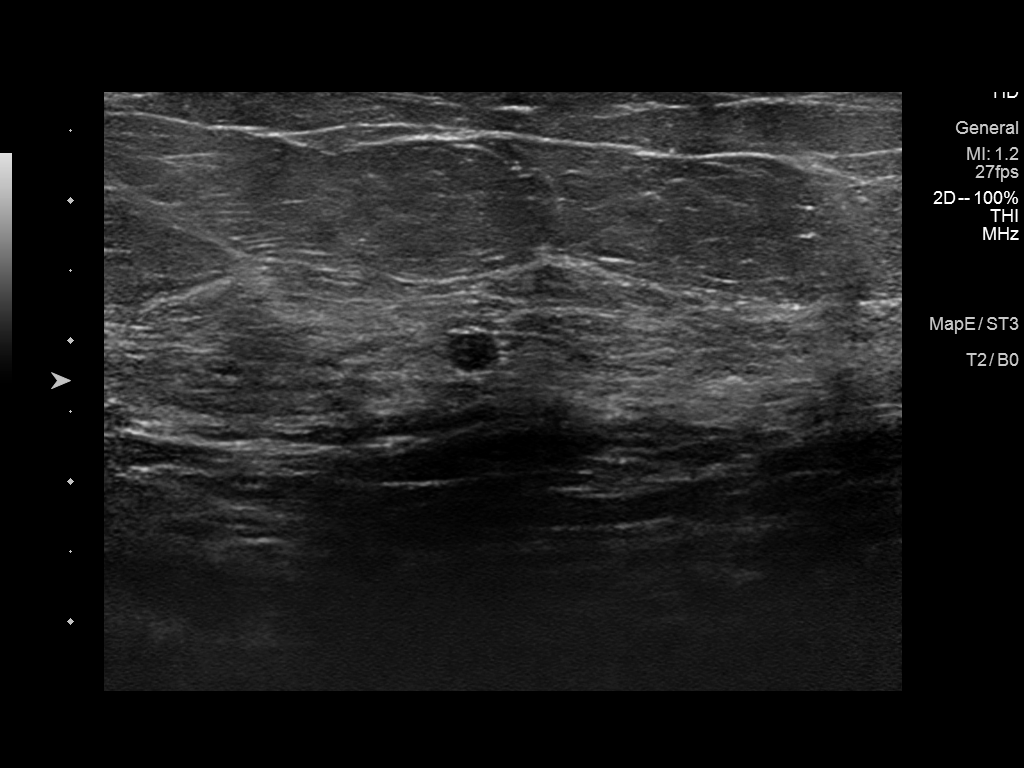
[im 5/5]
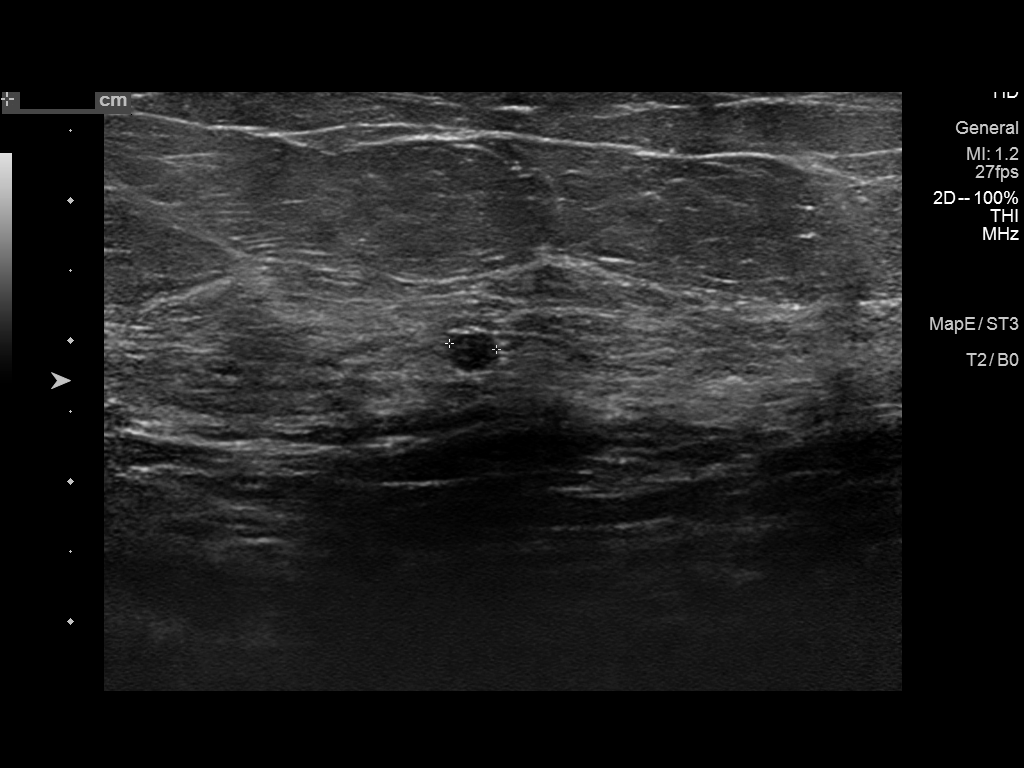

[5 of 5 positions shown; findings below may reference images not displayed]

ACR Breast Density Category b: There are scattered areas of
fibroglandular density.
FINDINGS: Lateral views of bilateral breasts, spot compression right view,
spot compression left MLO view are submitted. The previously
questioned mass in the central right breast persists on additional
views. The previously questioned mass in the slight lower left
breast persists on additional views.

Mammographic images were processed with CAD.

Targeted ultrasound is performed, showing 0.74 cm oval hypoechoic
lesion at the left breast 8 o'clock 3 cm from nipple likely
correlating to the mammographic mass. No other focal discrete
abnormal cystic or solid lesions identified within the lower left
breast.

At the right breast 12 o'clock 1 cm from nipple, there is a 0.3 cm
minimal complicated cyst correlating to the mammographic mass. This
is benign.
IMPRESSION: Probable benign findings.

RECOMMENDATION:
Six-month follow-up mammogram and ultrasound of left breast.

I have discussed the findings and recommendations with the patient.
Results were also provided in writing at the conclusion of the
visit. If applicable, a reminder letter will be sent to the patient
regarding the next appointment.

BI-RADS CATEGORY  3: Probably benign.

## 2018-09-11 ENCOUNTER — Encounter: Payer: Self-pay | Admitting: Family Medicine

## 2018-09-11 ENCOUNTER — Other Ambulatory Visit: Payer: Self-pay

## 2018-09-11 ENCOUNTER — Ambulatory Visit (INDEPENDENT_AMBULATORY_CARE_PROVIDER_SITE_OTHER): Payer: Medicare HMO | Admitting: Family Medicine

## 2018-09-11 VITALS — BP 102/62 | HR 76 | Ht 65.0 in | Wt 206.0 lb

## 2018-09-11 DIAGNOSIS — L309 Dermatitis, unspecified: Secondary | ICD-10-CM

## 2018-09-11 DIAGNOSIS — K219 Gastro-esophageal reflux disease without esophagitis: Secondary | ICD-10-CM | POA: Diagnosis not present

## 2018-09-11 DIAGNOSIS — L03317 Cellulitis of buttock: Secondary | ICD-10-CM | POA: Diagnosis not present

## 2018-09-11 DIAGNOSIS — M779 Enthesopathy, unspecified: Secondary | ICD-10-CM

## 2018-09-11 MED ORDER — CEPHALEXIN 500 MG PO CAPS: 500.0000 mg | ORAL_CAPSULE | Freq: Three times a day (TID) | ORAL | 0 refills | Status: DC

## 2018-09-11 MED ORDER — PREDNISONE 10 MG PO TABS: ORAL_TABLET | ORAL | 1 refills | Status: DC

## 2018-09-11 MED ORDER — TRIAMCINOLONE ACETONIDE 0.1 % EX CREA: 1.0000 "application " | TOPICAL_CREAM | Freq: Two times a day (BID) | CUTANEOUS | 0 refills | Status: DC

## 2018-09-11 MED ORDER — MUPIROCIN 2 % EX OINT: 1.0000 "application " | TOPICAL_OINTMENT | Freq: Two times a day (BID) | CUTANEOUS | 0 refills | Status: DC

## 2018-09-11 NOTE — Progress Notes (Signed)
Date:  09/11/2018   Name:  Lauren Leblanc   DOB:  1950-05-31   MRN:  [id number]   Chief Complaint: Rash (spots on abdomen- poison oak?); knot on tailbone (sore and red- noticed x 3 days ago); Foot Pain ("knot on bottom of foot"- feels like i'm walking on pebble); and Gastroesophageal Reflux (fill pepcid in place of ranitidine)  Rash  This is a new problem. The current episode started in the past 7 days. The affected locations include the abdomen and back. The rash is characterized by redness and itchiness. Associated with: tick bite. Pertinent negatives include no anorexia, congestion, cough, diarrhea, eye pain, facial edema, fatigue, fever, joint pain, nail changes, rhinorrhea, shortness of breath, sore throat or vomiting. Past treatments include nothing. The treatment provided mild relief.  Foot Pain  This is a new problem. The current episode started 1 to 4 weeks ago. The problem has been unchanged. Associated symptoms include a rash. Pertinent negatives include no abdominal pain, anorexia, chest pain, chills, congestion, coughing, fatigue, fever, headaches, myalgias, nausea, neck pain, sore throat or vomiting. She has tried NSAIDs for the symptoms. The treatment provided moderate relief.  Gastroesophageal Reflux  She reports no abdominal pain, no belching, no chest pain, no choking, no coughing, no dysphagia, no early satiety, no globus sensation, no heartburn, no hoarse voice, no nausea, no sore throat, no stridor, no tooth decay, no water brash or no wheezing. This is a chronic problem. The current episode started more than 1 year ago. The symptoms are aggravated by certain foods. Pertinent negatives include no fatigue. She has tried a histamine-2 antagonist for the symptoms. The treatment provided moderate relief.    Review of Systems  Constitutional: Negative for chills, fatigue and fever.  HENT: Negative for congestion, drooling, ear discharge, ear pain, hoarse voice, rhinorrhea  and sore throat.   Eyes: Negative for pain.  Respiratory: Negative for cough, choking, shortness of breath and wheezing.   Cardiovascular: Negative for chest pain, palpitations and leg swelling.  Gastrointestinal: Negative for abdominal pain, anorexia, blood in stool, constipation, diarrhea, dysphagia, heartburn, nausea and vomiting.  Endocrine: Negative for polydipsia.  Genitourinary: Negative for dysuria, frequency, hematuria and urgency.  Musculoskeletal: Negative for back pain, joint pain, myalgias and neck pain.  Skin: Positive for rash. Negative for nail changes.  Allergic/Immunologic: Negative for environmental allergies.  Neurological: Negative for dizziness and headaches.  Hematological: Does not bruise/bleed easily.  Psychiatric/Behavioral: Negative for suicidal ideas. The patient is not nervous/anxious.     Patient Active Problem List   Diagnosis Date Noted  . Chest pain 04/24/2017  . Essential hypertension 01/06/2016    Allergies  Allergen Reactions  . Lisinopril Other (See Comments)    Chest pain, dizziness, cough  . Codeine     "hair crawling"  . Hctz [Hydrochlorothiazide]     Jittery feeling    Past Surgical History:  Procedure Laterality Date  . BACK SURGERY    . BREAST BIOPSY Bilateral    neg  . TUBAL LIGATION      Social History   Tobacco Use  . Smoking status: Never Smoker  . Smokeless tobacco: Never Used  Substance Use Topics  . Alcohol use: No    Alcohol/week: 0.0 standard drinks  . Drug use: No     Medication list has been reviewed and updated.  Current Meds  Medication Sig  . famotidine (PEPCID) 40 MG tablet Take 40 mg by mouth daily.  . propranolol (INDERAL) 20 MG tablet  Take 1 tablet (20 mg total) by mouth 2 (two) times daily.    PHQ 2/9 Scores 02/20/2018 02/01/2017 08/12/2015 06/21/2015  PHQ - 2 Score 0 0 0 0  PHQ- 9 Score - 1 - -    BP Readings from Last 3 Encounters:  09/11/18 102/62  07/03/18 122/80  02/20/18 (!) 130/98     Physical Exam Vitals signs and nursing note reviewed.  Constitutional:      Appearance: She is well-developed.  HENT:     Head: Normocephalic.     Right Ear: Tympanic membrane, ear canal and external ear normal.     Left Ear: Tympanic membrane, ear canal and external ear normal.     Nose: Nose normal.  Eyes:     General: Lids are everted, no foreign bodies appreciated. No scleral icterus.       Left eye: No foreign body or hordeolum.     Conjunctiva/sclera: Conjunctivae normal.     Right eye: Right conjunctiva is not injected.     Left eye: Left conjunctiva is not injected.     Pupils: Pupils are equal, round, and reactive to light.  Neck:     Musculoskeletal: Normal range of motion and neck supple.     Thyroid: No thyromegaly.     Vascular: No carotid bruit or JVD.     Trachea: No tracheal deviation.  Cardiovascular:     Rate and Rhythm: Normal rate and regular rhythm.     Pulses: Normal pulses.     Heart sounds: Normal heart sounds. No murmur. No friction rub. No gallop.   Pulmonary:     Effort: Pulmonary effort is normal. No respiratory distress.     Breath sounds: Normal breath sounds. No wheezing, rhonchi or rales.  Abdominal:     General: Bowel sounds are normal.     Palpations: Abdomen is soft. There is no mass.     Tenderness: There is no abdominal tenderness. There is no guarding or rebound.  Musculoskeletal: Normal range of motion.     Right foot: Tenderness present.  Lymphadenopathy:     Cervical: No cervical adenopathy.  Skin:    General: Skin is warm.     Findings: No rash.  Neurological:     Mental Status: She is alert and oriented to person, place, and time.     Cranial Nerves: No cranial nerve deficit.     Deep Tendon Reflexes: Reflexes normal.  Psychiatric:        Mood and Affect: Mood is not anxious or depressed.     Wt Readings from Last 3 Encounters:  09/11/18 206 lb (93.4 kg)  07/03/18 195 lb (88.5 kg)  02/20/18 187 lb (84.8 kg)    BP  102/62   Pulse 76   Ht  (1.651 m)   Wt 206 lb (93.4 kg)   BMI 34.28 kg/m   Assessment and Plan: 1. Cellulitis of buttock Patient has a secondary infection at the base of the spine is consistent with a secondary infection of an insect bite with associated cellulitis likely this is staph induced and will initiate cephalexin 500 mg 3 times a day for 10 days and Bactroban apply twice a day. - cephALEXin (KEFLEX) 500 MG capsule; Take 1 capsule (500 mg total) by mouth 3 (three) times daily.  Dispense: 30 capsule; Refill: 0 - mupirocin ointment (BACTROBAN) 2 %; Apply 1 application topically 2 (two) times daily.  Dispense: 22 g; Refill: 0  2. Dermatitis Patient has a rash  that is on the abdomen which is consistent with a dermatitis.  Will initiate a prednisone taper and was given triamcinolone cream to use on each area of concern. - predniSONE (DELTASONE) 10 MG tablet; Taper 6,6,6,5,5,5,4,4,3,3,2,2,1,1  Dispense: 53 tablet; Refill: 1 - triamcinolone cream (KENALOG) 0.1 %; Apply 1 application topically 2 (two) times daily.  Dispense: 30 g; Refill: 0  3. Tendonitis Patient has a swelling with tenderness in the ball of her foot this may be secondary to a tendinitis of 1 of the flexor tendons of the toes will initiate a prednisone taper because she is having difficulty taking NSAIDs at this time. - predniSONE (DELTASONE) 10 MG tablet; Taper 6,6,6,5,5,5,4,4,3,3,2,2,1,1  Dispense: 53 tablet; Refill: 1  4. Gastroesophageal reflux disease, esophagitis presence not specified Patient is unable to continue Zantac and will switch over to famotidine 40 mg once a day for control of GERD. - famotidine (PEPCID) 40 MG tablet; Take 40 mg by mouth daily.

## 2018-09-11 NOTE — Patient Instructions (Signed)
c 

## 2018-09-12 ENCOUNTER — Other Ambulatory Visit: Payer: Self-pay

## 2018-09-12 DIAGNOSIS — K219 Gastro-esophageal reflux disease without esophagitis: Secondary | ICD-10-CM

## 2018-09-12 MED ORDER — FAMOTIDINE 40 MG PO TABS: 40.0000 mg | ORAL_TABLET | Freq: Every day | ORAL | 5 refills | Status: DC

## 2018-09-12 NOTE — Progress Notes (Unsigned)
Sent in pepcid

## 2018-10-14 DIAGNOSIS — L570 Actinic keratosis: Secondary | ICD-10-CM | POA: Diagnosis not present

## 2018-12-06 ENCOUNTER — Ambulatory Visit (INDEPENDENT_AMBULATORY_CARE_PROVIDER_SITE_OTHER): Payer: Medicare HMO | Admitting: Family Medicine

## 2018-12-06 ENCOUNTER — Encounter: Payer: Self-pay | Admitting: Family Medicine

## 2018-12-06 ENCOUNTER — Other Ambulatory Visit: Payer: Self-pay

## 2018-12-06 VITALS — BP 150/102 | HR 74 | Ht 65.0 in | Wt 204.0 lb

## 2018-12-06 DIAGNOSIS — I10 Essential (primary) hypertension: Secondary | ICD-10-CM

## 2018-12-06 DIAGNOSIS — Z23 Encounter for immunization: Secondary | ICD-10-CM

## 2018-12-06 DIAGNOSIS — S40811A Abrasion of right upper arm, initial encounter: Secondary | ICD-10-CM | POA: Diagnosis not present

## 2018-12-06 DIAGNOSIS — S01351A Open bite of right ear, initial encounter: Secondary | ICD-10-CM

## 2018-12-06 DIAGNOSIS — T148XXA Other injury of unspecified body region, initial encounter: Secondary | ICD-10-CM

## 2018-12-06 MED ORDER — PROPRANOLOL HCL 20 MG PO TABS: 20.0000 mg | ORAL_TABLET | Freq: Two times a day (BID) | ORAL | 3 refills | Status: DC

## 2018-12-06 MED ORDER — AMOXICILLIN-POT CLAVULANATE 875-125 MG PO TABS: 1.0000 | ORAL_TABLET | Freq: Two times a day (BID) | ORAL | 0 refills | Status: DC

## 2018-12-06 MED ORDER — MUPIROCIN 2 % EX OINT: 1.0000 "application " | TOPICAL_OINTMENT | Freq: Two times a day (BID) | CUTANEOUS | 5 refills | Status: DC

## 2018-12-06 NOTE — Progress Notes (Signed)
Date:  12/06/2018   Name:  Lauren PainDebra L Sidell   DOB:  1951-02-27   MRN:  [id number]   Chief Complaint: Animal Bite (bite behind R) ear and scratches on arms)  Animal Bite  The incident occurred more than 2 days ago. The incident occurred at home. There is an injury to the left wrist. Pertinent negatives include no fussiness, no numbness, no abdominal Leblanc, no nausea, no vomiting, no bladder incontinence, no headaches, no light-headedness, no weakness and no cough.    Review of Systems  Constitutional: Negative.  Negative for chills, fatigue, fever and unexpected weight change.  HENT: Negative for congestion, ear discharge, ear Leblanc, rhinorrhea, sinus pressure, sneezing and sore throat.   Eyes: Negative for photophobia, Leblanc, discharge, redness and itching.  Respiratory: Negative for cough, shortness of breath, wheezing and stridor.   Gastrointestinal: Negative for abdominal Leblanc, blood in stool, constipation, diarrhea, nausea and vomiting.  Endocrine: Negative for cold intolerance, heat intolerance, polydipsia, polyphagia and polyuria.  Genitourinary: Negative for bladder incontinence, dysuria, flank Leblanc, frequency, hematuria, menstrual problem, pelvic Leblanc, urgency, vaginal bleeding and vaginal discharge.  Musculoskeletal: Negative for arthralgias, back Leblanc and myalgias.  Skin: Negative for rash.  Allergic/Immunologic: Negative for environmental allergies and food allergies.  Neurological: Negative for dizziness, weakness, light-headedness, numbness and headaches.  Hematological: Negative for adenopathy. Does not bruise/bleed easily.  Psychiatric/Behavioral: Negative for dysphoric mood. The patient is not nervous/anxious.     Patient Active Problem List   Diagnosis Date Noted  . Chest Leblanc 04/24/2017  . Essential hypertension 01/06/2016    Allergies  Allergen Reactions  . Lisinopril Other (See Comments)    Chest Leblanc, dizziness, cough  . Codeine     "hair crawling"  .  Hctz [Hydrochlorothiazide]     Jittery feeling    Past Surgical History:  Procedure Laterality Date  . BACK SURGERY    . BREAST BIOPSY Bilateral    neg  . TUBAL LIGATION      Social History   Tobacco Use  . Smoking status: Never Smoker  . Smokeless tobacco: Never Used  Substance Use Topics  . Alcohol use: No    Alcohol/week: 0.0 standard drinks  . Drug use: No     Medication list has been reviewed and updated.  Current Meds  Medication Sig  . famotidine (PEPCID) 40 MG tablet Take 1 tablet (40 mg total) by mouth daily.  . mupirocin ointment (BACTROBAN) 2 % Apply 1 application topically 2 (two) times daily.  . propranolol (INDERAL) 20 MG tablet Take 1 tablet (20 mg total) by mouth 2 (two) times daily. (Patient taking differently: Take 20 mg by mouth daily. )  . [DISCONTINUED] cephALEXin (KEFLEX) 500 MG capsule Take 1 capsule (500 mg total) by mouth 3 (three) times daily.    PHQ 2/9 Scores 12/06/2018 02/20/2018 02/01/2017 08/12/2015  PHQ - 2 Score 0 0 0 0  PHQ- 9 Score 0 - 1 -    BP Readings from Last 3 Encounters:  12/06/18 (!) 150/102  09/11/18 102/62  07/03/18 122/80    Physical Exam Vitals signs and nursing note reviewed.  Constitutional:      General: She is not in acute distress.    Appearance: She is not diaphoretic.  HENT:     Head: Normocephalic and atraumatic.     Right Ear: Tympanic membrane, ear canal and external ear normal.     Left Ear: Tympanic membrane, ear canal and external ear normal.     Nose:  Nose normal. No congestion or rhinorrhea.     Mouth/Throat:     Mouth: Mucous membranes are moist.  Eyes:     General:        Right eye: No discharge.        Left eye: No discharge.     Conjunctiva/sclera: Conjunctivae normal.     Pupils: Pupils are equal, round, and reactive to light.  Neck:     Musculoskeletal: Normal range of motion and neck supple.     Thyroid: No thyromegaly.     Vascular: No JVD.  Cardiovascular:     Rate and Rhythm:  Normal rate and regular rhythm.     Heart sounds: Normal heart sounds. No murmur. No friction rub. No gallop.   Pulmonary:     Effort: Pulmonary effort is normal.     Breath sounds: Normal breath sounds. No wheezing or rhonchi.  Abdominal:     General: Bowel sounds are normal.     Palpations: Abdomen is soft. There is no mass.     Tenderness: There is no abdominal tenderness. There is no guarding.  Musculoskeletal: Normal range of motion.  Lymphadenopathy:     Cervical: No cervical adenopathy.  Skin:    General: Skin is warm and dry.     Capillary Refill: Capillary refill takes less than 2 seconds.  Neurological:     Mental Status: She is alert.     Deep Tendon Reflexes: Reflexes are normal and symmetric.     Wt Readings from Last 3 Encounters:  12/06/18 204 lb (92.5 kg)  09/11/18 206 lb (93.4 kg)  07/03/18 195 lb (88.5 kg)    BP (!) 150/102   Pulse 74   Ht 5\' 5"  (1.651 m)   Wt 204 lb (92.5 kg)   BMI 33.95 kg/m   Assessment and Plan:   1. Puncture wound Patient had a puncture wound of the left forearm from her cat (who has anger management concerns according to her vet and needs to have anger management catTherapy).  This occurred on Monday and it involves the left wrist distal forearm.  Will initiate Augmentin and Bactroban.  Patient was given tetanus. - amoxicillin-clavulanate (AUGMENTIN) 875-125 MG tablet; Take 1 tablet by mouth 2 (two) times daily.  Dispense: 20 tablet; Refill: 0 - mupirocin ointment (BACTROBAN) 2 %; Apply 1 application topically 2 (two) times daily.  Dispense: 22 g; Refill: 5  2. Essential hypertension Patient is only taking her propranolol once a day and this was explained that this is a half-life concern needs to be taken on a twice a day basis patient will resume 20 mg twice a day and will recheck. - propranolol (INDERAL) 20 MG tablet; Take 1 tablet (20 mg total) by mouth 2 (two) times daily.  Dispense: 180 tablet; Refill: 3  3. Need for  diphtheria-tetanus-pertussis (Tdap) vaccine Patient is due for tetanus and this was initiated due to the animal bite to the forearm. - Tdap vaccine greater than or equal to 7yo IM

## 2018-12-06 NOTE — Patient Instructions (Signed)
° °Calorie Counting for Weight Loss °Calories are units of energy. Your body needs a certain amount of calories from food to keep you going throughout the day. When you eat more calories than your body needs, your body stores the extra calories as fat. When you eat fewer calories than your body needs, your body burns fat to get the energy it needs. °Calorie counting means keeping track of how many calories you eat and drink each day. Calorie counting can be helpful if you need to lose weight. If you make sure to eat fewer calories than your body needs, you should lose weight. Ask your health care provider what a healthy weight is for you. °For calorie counting to work, you will need to eat the right number of calories in a day in order to lose a healthy amount of weight per week. A dietitian can help you determine how many calories you need in a day and will give you suggestions on how to reach your calorie goal. °· A healthy amount of weight to lose per week is usually 1-2 lb (0.5-0.9 kg). This usually means that your daily calorie intake should be reduced by 500-750 calories. °· Eating 1,200 - 1,500 calories per day can help most women lose weight. °· Eating 1,500 - 1,800 calories per day can help most men lose weight. °What is my plan? °My goal is to have __________ calories per day. °If I have this many calories per day, I should lose around __________ pounds per week. °What do I need to know about calorie counting? °In order to meet your daily calorie goal, you will need to: °· Find out how many calories are in each food you would like to eat. Try to do this before you eat. °· Decide how much of the food you plan to eat. °· Write down what you ate and how many calories it had. Doing this is called keeping a food log. °To successfully lose weight, it is important to balance calorie counting with a healthy lifestyle that includes regular activity. Aim for 150 minutes of moderate exercise (such as walking) or 75  minutes of vigorous exercise (such as running) each week. °Where do I find calorie information? ° °The number of calories in a food can be found on a Nutrition Facts label. If a food does not have a Nutrition Facts label, try to look up the calories online or ask your dietitian for help. °Remember that calories are listed per serving. If you choose to have more than one serving of a food, you will have to multiply the calories per serving by the amount of servings you plan to eat. For example, the label on a package of bread might say that a serving size is 1 slice and that there are 90 calories in a serving. If you eat 1 slice, you will have eaten 90 calories. If you eat 2 slices, you will have eaten 180 calories. °How do I keep a food log? °Immediately after each meal, record the following information in your food log: °· What you ate. Don't forget to include toppings, sauces, and other extras on the food. °· How much you ate. This can be measured in cups, ounces, or number of items. °· How many calories each food and drink had. °· The total number of calories in the meal. °Keep your food log near you, such as in a small notebook in your pocket, or use a mobile app or website. Some programs will   calculate calories for you and show you how many calories you have left for the day to meet your goal. °What are some calorie counting tips? ° °· Use your calories on foods and drinks that will fill you up and not leave you hungry: °? Some examples of foods that fill you up are nuts and nut butters, vegetables, lean proteins, and high-fiber foods like whole grains. High-fiber foods are foods with more than 5 g fiber per serving. °? Drinks such as sodas, specialty coffee drinks, alcohol, and juices have a lot of calories, yet do not fill you up. °· Eat nutritious foods and avoid empty calories. Empty calories are calories you get from foods or beverages that do not have many vitamins or protein, such as candy, sweets, and  soda. It is better to have a nutritious high-calorie food (such as an avocado) than a food with few nutrients (such as a bag of chips). °· Know how many calories are in the foods you eat most often. This will help you calculate calorie counts faster. °· Pay attention to calories in drinks. Low-calorie drinks include water and unsweetened drinks. °· Pay attention to nutrition labels for "low fat" or "fat free" foods. These foods sometimes have the same amount of calories or more calories than the full fat versions. They also often have added sugar, starch, or salt, to make up for flavor that was removed with the fat. °· Find a way of tracking calories that works for you. Get creative. Try different apps or programs if writing down calories does not work for you. °What are some portion control tips? °· Know how many calories are in a serving. This will help you know how many servings of a certain food you can have. °· Use a measuring cup to measure serving sizes. You could also try weighing out portions on a kitchen scale. With time, you will be able to estimate serving sizes for some foods. °· Take some time to put servings of different foods on your favorite plates, bowls, and cups so you know what a serving looks like. °· Try not to eat straight from a bag or box. Doing this can lead to overeating. Put the amount you would like to eat in a cup or on a plate to make sure you are eating the right portion. °· Use smaller plates, glasses, and bowls to prevent overeating. °· Try not to multitask (for example, watch TV or use your computer) while eating. If it is time to eat, sit down at a table and enjoy your food. This will help you to know when you are full. It will also help you to be aware of what you are eating and how much you are eating. °What are tips for following this plan? °Reading food labels °· Check the calorie count compared to the serving size. The serving size may be smaller than what you are used to  eating. °· Check the source of the calories. Make sure the food you are eating is high in vitamins and protein and low in saturated and trans fats. °Shopping °· Read nutrition labels while you shop. This will help you make healthy decisions before you decide to purchase your food. °· Make a grocery list and stick to it. °Cooking °· Try to cook your favorite foods in a healthier way. For example, try baking instead of frying. °· Use low-fat dairy products. °Meal planning °· Use more fruits and vegetables. Half of your plate should be   fruits and vegetables. °· Include lean proteins like poultry and fish. °How do I count calories when eating out? °· Ask for smaller portion sizes. °· Consider sharing an entree and sides instead of getting your own entree. °· If you get your own entree, eat only half. Ask for a box at the beginning of your meal and put the rest of your entree in it so you are not tempted to eat it. °· If calories are listed on the menu, choose the lower calorie options. °· Choose dishes that include vegetables, fruits, whole grains, low-fat dairy products, and lean protein. °· Choose items that are boiled, broiled, grilled, or steamed. Stay away from items that are buttered, battered, fried, or served with cream sauce. Items labeled "crispy" are usually fried, unless stated otherwise. °· Choose water, low-fat milk, unsweetened iced tea, or other drinks without added sugar. If you want an alcoholic beverage, choose a lower calorie option such as a glass of wine or light beer. °· Ask for dressings, sauces, and syrups on the side. These are usually high in calories, so you should limit the amount you eat. °· If you want a salad, choose a garden salad and ask for grilled meats. Avoid extra toppings like bacon, cheese, or fried items. Ask for the dressing on the side, or ask for olive oil and vinegar or lemon to use as dressing. °· Estimate how many servings of a food you are given. For example, a serving of  cooked rice is ½ cup or about the size of half a baseball. Knowing serving sizes will help you be aware of how much food you are eating at restaurants. The list below tells you how big or small some common portion sizes are based on everyday objects: °? 1 oz--4 stacked dice. °? 3 oz--1 deck of cards. °? 1 tsp--1 die. °? 1 Tbsp--½ a ping-pong ball. °? 2 Tbsp--1 ping-pong ball. °? ½ cup--½ baseball. °? 1 cup--1 baseball. °Summary °· Calorie counting means keeping track of how many calories you eat and drink each day. If you eat fewer calories than your body needs, you should lose weight. °· A healthy amount of weight to lose per week is usually 1-2 lb (0.5-0.9 kg). This usually means reducing your daily calorie intake by 500-750 calories. °· The number of calories in a food can be found on a Nutrition Facts label. If a food does not have a Nutrition Facts label, try to look up the calories online or ask your dietitian for help. °· Use your calories on foods and drinks that will fill you up, and not on foods and drinks that will leave you hungry. °· Use smaller plates, glasses, and bowls to prevent overeating. °This information is not intended to replace advice given to you by your health care provider. Make sure you discuss any questions you have with your health care provider. °Document Released: 04/10/2005 Document Revised: 12/28/2017 Document Reviewed: 03/10/2016 °Elsevier Patient Education © 2020 [employer] ° °

## 2018-12-10 DIAGNOSIS — G8929 Other chronic pain: Secondary | ICD-10-CM | POA: Diagnosis not present

## 2018-12-10 DIAGNOSIS — M1712 Unilateral primary osteoarthritis, left knee: Secondary | ICD-10-CM | POA: Diagnosis not present

## 2018-12-10 DIAGNOSIS — M25562 Pain in left knee: Secondary | ICD-10-CM | POA: Diagnosis not present

## 2018-12-11 ENCOUNTER — Encounter: Payer: Self-pay | Admitting: Emergency Medicine

## 2018-12-11 ENCOUNTER — Other Ambulatory Visit: Payer: Self-pay

## 2018-12-11 ENCOUNTER — Emergency Department
Admission: EM | Admit: 2018-12-11 | Discharge: 2018-12-11 | Disposition: A | Discharge status: Home / Self Care | Payer: Medicare HMO | Attending: Emergency Medicine | Admitting: Emergency Medicine

## 2018-12-11 DIAGNOSIS — R21 Rash and other nonspecific skin eruption: Secondary | ICD-10-CM | POA: Insufficient documentation

## 2018-12-11 DIAGNOSIS — I10 Essential (primary) hypertension: Secondary | ICD-10-CM | POA: Diagnosis not present

## 2018-12-11 LAB — CBC WITH DIFFERENTIAL/PLATELET
Abs Immature Granulocytes: 0.05 10*3/uL (ref 0.00–0.07)
Basophils Absolute: 0 10*3/uL (ref 0.0–0.1)
Basophils Relative: 0 %
Eosinophils Absolute: 0 10*3/uL (ref 0.0–0.5)
Eosinophils Relative: 0 %
HCT: 37.3 % (ref 36.0–46.0)
Hemoglobin: 12.7 g/dL (ref 12.0–15.0)
Immature Granulocytes: 1 %
Lymphocytes Relative: 12 %
Lymphs Abs: 1.2 10*3/uL (ref 0.7–4.0)
MCH: 29.5 pg (ref 26.0–34.0)
MCHC: 34 g/dL (ref 30.0–36.0)
MCV: 86.7 fL (ref 80.0–100.0)
Monocytes Absolute: 0.3 10*3/uL (ref 0.1–1.0)
Monocytes Relative: 3 %
Neutro Abs: 8.1 10*3/uL — ABNORMAL HIGH (ref 1.7–7.7)
Neutrophils Relative %: 84 %
Platelets: 269 10*3/uL (ref 150–400)
RBC: 4.3 MIL/uL (ref 3.87–5.11)
RDW: 12.7 % (ref 11.5–15.5)
WBC: 9.7 10*3/uL (ref 4.0–10.5)
nRBC: 0 % (ref 0.0–0.2)

## 2018-12-11 LAB — COMPREHENSIVE METABOLIC PANEL
ALT: 12 U/L (ref 0–44)
AST: 20 U/L (ref 15–41)
Albumin: 4.2 g/dL (ref 3.5–5.0)
Alkaline Phosphatase: 63 U/L (ref 38–126)
Anion gap: 10 (ref 5–15)
BUN: 20 mg/dL (ref 8–23)
CO2: 24 mmol/L (ref 22–32)
Calcium: 9.9 mg/dL (ref 8.9–10.3)
Chloride: 106 mmol/L (ref 98–111)
Creatinine, Ser: 0.54 mg/dL (ref 0.44–1.00)
GFR calc Af Amer: >60 mL/min (ref >60)
GFR calc non Af Amer: >60 mL/min (ref >60)
Glucose, Bld: 139 mg/dL — ABNORMAL HIGH (ref 70–99)
Potassium: 3.7 mmol/L (ref 3.5–5.1)
Sodium: 140 mmol/L (ref 135–145)
Total Bilirubin: 0.6 mg/dL (ref 0.3–1.2)
Total Protein: 6.9 g/dL (ref 6.5–8.1)

## 2018-12-11 LAB — TSH: TSH: 1.359 u[IU]/mL (ref 0.350–4.500)

## 2018-12-11 LAB — T4, FREE: Free T4: 0.88 ng/dL (ref 0.61–1.12)

## 2018-12-11 LAB — TROPONIN I (HIGH SENSITIVITY): Troponin I (High Sensitivity): 6 ng/L (ref <18)

## 2018-12-11 MED ORDER — SODIUM CHLORIDE 0.9 % IV SOLN
Freq: Once | INTRAVENOUS | Status: AC
  Administered 2018-12-11: 09:00:00 via INTRAVENOUS

## 2018-12-11 NOTE — ED Provider Notes (Addendum)
[facility] Emergency Department Provider Note       Time seen: ----------------------------------------- 7:04 AM on 12/11/2018 -----------------------------------------   I have reviewed the triage vital signs and the nursing notes.  HISTORY   Chief Complaint Rash and Hypertension    HPI Lauren Leblanc is a 68 y.o. female with a history of angina, GERD, hypertension who presents to the ED for elevated blood pressure.  Patient states she recently increased her blood pressure medications and her blood pressure this morning was in the 170s.  She did take her medicine this morning.  She complained of headache, rash to her legs that she thinks may be from an antibiotic after a wound sustained by her cat.  Past Medical History:  Diagnosis Date  . Angina at rest [facility](HCC)   . GERD (gastroesophageal reflux disease)   . Hypertension     Patient Active Problem List   Diagnosis Date Noted  . Chest pain 04/24/2017  . Essential hypertension 01/06/2016    Past Surgical History:  Procedure Laterality Date  . BACK SURGERY    . BREAST BIOPSY Bilateral    neg  . TUBAL LIGATION      Allergies Lisinopril, Codeine, and Hydrochlorothiazide  Social History Social History   Tobacco Use  . Smoking status: Never Smoker  . Smokeless tobacco: Never Used  Substance Use Topics  . Alcohol use: No    Alcohol/week: 0.0 standard drinks  . Drug use: No   Review of Systems Constitutional: Negative for fever. Cardiovascular: Negative for chest pain. Respiratory: Negative for shortness of breath. Gastrointestinal: Negative for abdominal pain, vomiting and diarrhea. Musculoskeletal: Negative for back pain. Skin: Positive for rash Neurological: Positive for headache  All systems negative/normal/unremarkable except as stated in the HPI  ____________________________________________   PHYSICAL EXAM:  VITAL SIGNS: ED Triage Vitals  Enc Vitals Group     BP  12/11/18 0641 (!) 159/97     Pulse Rate 12/11/18 0641 68     Resp 12/11/18 0641 18     Temp 12/11/18 0641 97.7 F (36.5 C)     Temp Source 12/11/18 0641 Oral     SpO2 12/11/18 0641 97 %     Weight 12/11/18 0643 204 lb (92.5 kg)     Height 12/11/18 0643 5\' 5"  (1.651 m)     Head Circumference --      Peak Flow --      Pain Score 12/11/18 0643 8     Pain Loc --      Pain Edu? --      Excl. in GC? --    Constitutional: Alert and oriented. Well appearing and in no distress. Eyes: Conjunctivae are normal. Normal extraocular movements. ENT      Head: Normocephalic and atraumatic.      Nose: No congestion/rhinnorhea.      Mouth/Throat: Mucous membranes are moist.      Neck: No stridor. Cardiovascular: Normal rate, regular rhythm. No murmurs, rubs, or gallops. Respiratory: Normal respiratory effort without tachypnea nor retractions. Breath sounds are clear and equal bilaterally. No wheezes/rales/rhonchi. Gastrointestinal: Soft and nontender. Normal bowel sounds Musculoskeletal: Nontender with normal range of motion in extremities. No lower extremity tenderness nor edema. Neurologic:  Normal speech and language. No gross focal neurologic deficits are appreciated.  Skin:  Skin is warm, dry and intact.  Nonpalpable erythema from mid tibia to the ankles bilaterally. Psychiatric: Mood and affect are normal. Speech and behavior are normal.  ____________________________________________  EKG: Interpreted by  me.  Sinus rhythm rate of 62 bpm, normal PR interval, normal QRS, normal QT  ____________________________________________  ED COURSE:  As part of my medical decision making, I reviewed the following data within the [place] History obtained from family if available, nursing notes, old chart and ekg, as well as notes from prior ED visits. Patient presented for hypertension and rash, we will assess with labs and imaging as indicated at this time.   Procedures  Denissa Cozart  Luba was evaluated in Emergency Department on 12/11/2018 for the symptoms described in the history of present illness. She was evaluated in the context of the global COVID-19 pandemic, which necessitated consideration that the patient might be at risk for infection with the SARS-CoV-2 virus that causes COVID-19. Institutional protocols and algorithms that pertain to the evaluation of patients at risk for COVID-19 are in a state of rapid change based on information released by regulatory bodies including the CDC and federal and state organizations. These policies and algorithms were followed during the patient's care in the ED.  ____________________________________________   LABS (pertinent positives/negatives)  Labs Reviewed  CBC WITH DIFFERENTIAL/PLATELET - Abnormal; Notable for the following components:      Result Value   Neutro Abs 8.1 (*)    All other components within normal limits  COMPREHENSIVE METABOLIC PANEL - Abnormal; Notable for the following components:   Glucose, Bld 139 (*)    All other components within normal limits  ROCKY MTN SPOTTED FVR ABS PNL(IGG+IGM)  LYME DISEASE DNA BY PCR(BORRELIA BURG)  TROPONIN I (HIGH SENSITIVITY)   ____________________________________________   DIFFERENTIAL DIAGNOSIS   Hypertension, anxiety, headache, migraine, tick bite, drug reaction  FINAL ASSESSMENT AND PLAN  Hypertension   Plan: The patient had presented for hypertension and a rash around her ankles. Patient's labs do not reveal any acute process.  I will advise her to stop her antibiotic and will encourage her to follow-up with her doctor should her blood pressure persistent being elevated she is cleared for outpatient follow-up with her doctor.   Laurence Aly, MD    Note: This note was generated in part or whole with voice recognition software. Voice recognition is usually quite accurate but there are transcription errors that can and very often do occur. I  apologize for any typographical errors that were not detected and corrected.     Earleen Newport, MD 12/11/18 [id number]    Earleen Newport, MD 12/11/18 [phone]

## 2018-12-11 NOTE — ED Notes (Signed)
Patient ambulatory to restroom with steady gait. Reports slight improvement of headache.

## 2018-12-11 NOTE — ED Triage Notes (Addendum)
Pt arrived via POV with daughter with reports of elevated BP, pt states she recently increased BP meds, states her BP this morning was in the 585F systolic. Pt did take BP meds this morning.  Pt c/o headache as well.  Pt also c/o rash to lower legs, thinks it may be from an antibiotic for a puncture wound from her cat.

## 2018-12-11 NOTE — ED Notes (Signed)
Patient reports she has pulled multiple ticks off of herself in the last few months. Denies fevers at home or any skin issues around bites.

## 2018-12-11 NOTE — ED Notes (Signed)
Patient ambulatory to lobby with steady gait and NAD noted. Verbalized understanding of discharge instructions and follow-up care.  

## 2018-12-11 NOTE — ED Notes (Signed)
Report given to Laura, RN.

## 2018-12-13 LAB — ROCKY MTN SPOTTED FVR ABS PNL(IGG+IGM)
RMSF IgG: UNDETERMINED
RMSF IgM: 0.38 index (ref 0.00–0.89)

## 2018-12-13 LAB — RMSF, IGG, IFA: RMSF, IGG, IFA: <1:64 {titer}

## 2018-12-15 LAB — LYME DISEASE DNA BY PCR(BORRELIA BURG): Lyme Disease(B.burgdorferi)PCR: NEGATIVE

## 2018-12-18 DIAGNOSIS — M6281 Muscle weakness (generalized): Secondary | ICD-10-CM | POA: Diagnosis not present

## 2018-12-18 DIAGNOSIS — G8929 Other chronic pain: Secondary | ICD-10-CM | POA: Diagnosis not present

## 2018-12-18 DIAGNOSIS — M1712 Unilateral primary osteoarthritis, left knee: Secondary | ICD-10-CM | POA: Diagnosis not present

## 2018-12-18 DIAGNOSIS — M25562 Pain in left knee: Secondary | ICD-10-CM | POA: Diagnosis not present

## 2018-12-24 DIAGNOSIS — M1712 Unilateral primary osteoarthritis, left knee: Secondary | ICD-10-CM | POA: Diagnosis not present

## 2018-12-24 DIAGNOSIS — M6281 Muscle weakness (generalized): Secondary | ICD-10-CM | POA: Diagnosis not present

## 2018-12-24 DIAGNOSIS — M25562 Pain in left knee: Secondary | ICD-10-CM | POA: Diagnosis not present

## 2018-12-24 DIAGNOSIS — G8929 Other chronic pain: Secondary | ICD-10-CM | POA: Diagnosis not present

## 2018-12-27 DIAGNOSIS — M25562 Pain in left knee: Secondary | ICD-10-CM | POA: Diagnosis not present

## 2018-12-27 DIAGNOSIS — M1712 Unilateral primary osteoarthritis, left knee: Secondary | ICD-10-CM | POA: Diagnosis not present

## 2018-12-27 DIAGNOSIS — M6281 Muscle weakness (generalized): Secondary | ICD-10-CM | POA: Diagnosis not present

## 2018-12-27 DIAGNOSIS — G8929 Other chronic pain: Secondary | ICD-10-CM | POA: Diagnosis not present

## 2019-01-03 DIAGNOSIS — R69 Illness, unspecified: Secondary | ICD-10-CM | POA: Diagnosis not present

## 2019-04-04 ENCOUNTER — Encounter: Payer: Self-pay | Admitting: Family Medicine

## 2019-04-04 ENCOUNTER — Ambulatory Visit (INDEPENDENT_AMBULATORY_CARE_PROVIDER_SITE_OTHER): Payer: Medicare HMO | Admitting: Family Medicine

## 2019-04-04 ENCOUNTER — Other Ambulatory Visit: Payer: Self-pay

## 2019-04-04 VITALS — BP 130/82 | HR 72 | Ht 65.0 in | Wt 202.0 lb

## 2019-04-04 DIAGNOSIS — R05 Cough: Secondary | ICD-10-CM | POA: Diagnosis not present

## 2019-04-04 DIAGNOSIS — K219 Gastro-esophageal reflux disease without esophagitis: Secondary | ICD-10-CM | POA: Diagnosis not present

## 2019-04-04 DIAGNOSIS — R5383 Other fatigue: Secondary | ICD-10-CM | POA: Diagnosis not present

## 2019-04-04 DIAGNOSIS — R053 Chronic cough: Secondary | ICD-10-CM

## 2019-04-04 DIAGNOSIS — R69 Illness, unspecified: Secondary | ICD-10-CM | POA: Diagnosis not present

## 2019-04-04 DIAGNOSIS — N393 Stress incontinence (female) (male): Secondary | ICD-10-CM | POA: Diagnosis not present

## 2019-04-04 DIAGNOSIS — R1319 Other dysphagia: Secondary | ICD-10-CM

## 2019-04-04 DIAGNOSIS — F320 Major depressive disorder, single episode, mild: Secondary | ICD-10-CM

## 2019-04-04 DIAGNOSIS — R1310 Dysphagia, unspecified: Secondary | ICD-10-CM

## 2019-04-04 MED ORDER — PANTOPRAZOLE SODIUM 40 MG PO TBEC: 40.0000 mg | DELAYED_RELEASE_TABLET | Freq: Every day | ORAL | 3 refills | Status: DC

## 2019-04-04 MED ORDER — BENZONATATE 100 MG PO CAPS: 100.0000 mg | ORAL_CAPSULE | Freq: Two times a day (BID) | ORAL | 0 refills | Status: DC | PRN

## 2019-04-04 MED ORDER — SERTRALINE HCL 25 MG PO TABS: 25.0000 mg | ORAL_TABLET | Freq: Every day | ORAL | 1 refills | Status: DC

## 2019-04-04 NOTE — Progress Notes (Signed)
Date:  04/04/2019   Name:  Lauren Leblanc   DOB:  May 13, 1950   MRN:  [id number]   Chief Complaint: Depression (PHQ9=16 and GAD7=11)  Depression        This is a new problem.  The current episode started more than 1 year ago.   The onset quality is sudden.   The problem occurs intermittently.  The problem has been gradually worsening since onset.  Associated symptoms include decreased concentration, fatigue, helplessness, hopelessness, insomnia, irritable, restlessness and indigestion.  Associated symptoms include no appetite change, no myalgias, no headaches and no suicidal ideas.     The symptoms are aggravated by medication.  Past treatments include nothing.  Compliance with treatment is good.  Past compliance problems include difficulty with treatment plan.  Previous treatment provided mild relief.  Past medical history includes thyroid problem.   Thyroid Problem Presents for follow-up visit. Symptoms include fatigue and weight gain. Patient reports no anxiety, cold intolerance, constipation, depressed mood, diaphoresis, diarrhea, dry skin, hoarse voice, nail problem, palpitations or tremors.    Lab Results  Component Value Date   CREATININE 0.54 12/11/2018   BUN 20 12/11/2018   NA 140 12/11/2018   K 3.7 12/11/2018   CL 106 12/11/2018   CO2 24 12/11/2018   Lab Results  Component Value Date   CHOL 213 (H) 02/20/2018   HDL 58 02/20/2018   LDLCALC 128 (H) 02/20/2018   TRIG 133 02/20/2018   CHOLHDL 5.3 04/25/2017   Lab Results  Component Value Date   TSH 1.359 12/11/2018   No results found for: HGBA1C   Review of Systems  Constitutional: Positive for fatigue and weight gain. Negative for appetite change, chills, diaphoresis and fever.  HENT: Negative for drooling, ear discharge, ear pain, hoarse voice and sore throat.   Respiratory: Negative for cough, shortness of breath and wheezing.   Cardiovascular: Negative for chest pain, palpitations and leg swelling.    Gastrointestinal: Negative for abdominal pain, blood in stool, constipation, diarrhea and nausea.  Endocrine: Negative for cold intolerance and polydipsia.  Genitourinary: Negative for dysuria, frequency, hematuria and urgency.  Musculoskeletal: Negative for back pain, myalgias and neck pain.  Skin: Negative for rash.  Allergic/Immunologic: Negative for environmental allergies.  Neurological: Negative for dizziness, tremors and headaches.  Hematological: Does not bruise/bleed easily.  Psychiatric/Behavioral: Positive for decreased concentration and depression. Negative for suicidal ideas. The patient has insomnia. The patient is not nervous/anxious.     Patient Active Problem List   Diagnosis Date Noted  . Chest pain 04/24/2017  . Essential hypertension 01/06/2016    Allergies  Allergen Reactions  . Lisinopril Other (See Comments)    Chest pain, dizziness, cough  . Codeine Hives    "hair crawling" "hair crawling"  . Hydrochlorothiazide Anxiety    Jittery feeling Jittery feeling    Past Surgical History:  Procedure Laterality Date  . BACK SURGERY    . BREAST BIOPSY Bilateral    neg  . TUBAL LIGATION      Social History   Tobacco Use  . Smoking status: Never Smoker  . Smokeless tobacco: Never Used  Substance Use Topics  . Alcohol use: No    Alcohol/week: 0.0 standard drinks  . Drug use: No     Medication list has been reviewed and updated.  Current Meds  Medication Sig  . famotidine (PEPCID) 40 MG tablet Take 1 tablet (40 mg total) by mouth daily.  . Lactobacillus Rhamnosus, GG, (CULTURELLE) CAPS Take  1 capsule by mouth 2 (two) times daily.  . mupirocin ointment (BACTROBAN) 2 % Apply 1 application topically 2 (two) times daily.  . propranolol (INDERAL) 20 MG tablet Take 1 tablet (20 mg total) by mouth 2 (two) times daily.    PHQ 2/9 Scores 04/04/2019 12/06/2018 02/20/2018 02/01/2017  PHQ - 2 Score 4 0 0 0  PHQ- 9 Score 16 0 - 1    BP Readings from Last 3  Encounters:  04/04/19 130/82  12/11/18 (!) 188/90  12/06/18 (!) 150/102    Physical Exam Constitutional:      General: She is irritable.     Wt Readings from Last 3 Encounters:  04/04/19 202 lb (91.6 kg)  12/11/18 204 lb (92.5 kg)  12/06/18 204 lb (92.5 kg)    BP 130/82   Pulse 72   Ht 5\' 5"  (1.651 m)   Wt 202 lb (91.6 kg)   BMI 33.61 kg/m   Assessment and Plan: 1. Fatigue, unspecified type Episodic.  Persistent.  PHQ was noted to be 16 with a gad score of 11.  We will initiate sertraline at 25 mg beginning with one half a tablet for 2 weeks and then extending to 1 a day.  Patient has a refill and she will call in 6 weeks whether or not we can continue. - sertraline (ZOLOFT) 25 MG tablet; Take 1 tablet (25 mg total) by mouth daily. One half tablet for 2 weeks  Dispense: 30 tablet; Refill: 1  2. Persistent cough Chronic.  Persistent.  Not associated with medication.  Beginning to think that it is secondary to reflux for which she is not taking any medication.  We will add some Tessalon Perles to take at night to see if this may help with the cough in the meantime see below for GERD. - benzonatate (TESSALON) 100 MG capsule; Take 1 capsule (100 mg total) by mouth 2 (two) times daily as needed for cough.  Dispense: 20 capsule; Refill: 0  3. Gastroesophageal reflux disease, unspecified whether esophagitis present Chronic.  Controlled.  Patient's been unable to obtain Pepcid on a regular basis.  We will switch to pantoprazole 40 mg once a day.  Should this continue our next step will be to do a barium swallow study. - pantoprazole (PROTONIX) 40 MG tablet; Take 1 tablet (40 mg total) by mouth daily.  Dispense: 30 tablet; Refill: 3  4. Esophageal dysphagia Patient may be having some dysphagia we will start with pantoprazole 40 mg.  If this does not resolve then our next step would be referral to GI or barium swallow. - pantoprazole (PROTONIX) 40 MG tablet; Take 1 tablet (40 mg total)  by mouth daily.  Dispense: 30 tablet; Refill: 3  5. Current mild episode of major depressive disorder, unspecified whether recurrent ([place]) As noted by PHQ score was 16.  We will initiate sertraline as previously described. - sertraline (ZOLOFT) 25 MG tablet; Take 1 tablet (25 mg total) by mouth daily. One half tablet for 2 weeks  Dispense: 30 tablet; Refill: 1  6. Urinary, incontinence, stress female Because of the cough patient is having frequent stress urinary incontinence symptoms.  Patient was given Kegel exercises for initiation prior to the possibility of referral.

## 2019-04-04 NOTE — Patient Instructions (Signed)

## 2019-05-08 DIAGNOSIS — M1712 Unilateral primary osteoarthritis, left knee: Secondary | ICD-10-CM | POA: Diagnosis not present

## 2019-05-15 DIAGNOSIS — M1712 Unilateral primary osteoarthritis, left knee: Secondary | ICD-10-CM | POA: Diagnosis not present

## 2019-06-15 ENCOUNTER — Ambulatory Visit: Payer: Medicare HMO | Attending: Internal Medicine

## 2019-06-15 ENCOUNTER — Other Ambulatory Visit: Payer: Self-pay

## 2019-06-15 DIAGNOSIS — Z23 Encounter for immunization: Secondary | ICD-10-CM

## 2019-06-15 NOTE — Progress Notes (Signed)
   Covid-[facility] Vaccination [facility]  Name:  CHYREL TAHA    MRN: [id number] DOB: 1951/01/29  06/15/2019  Ms. Hengel was observed post Covid-19 immunization for 15 minutes without incidence. She was provided with Vaccine Information Sheet and instruction to access the V-Safe system.   Ms. Lafoy was instructed to call 911 with any severe reactions post vaccine: Marland Kitchen Difficulty breathing  . Swelling of your face and throat  . A fast heartbeat  . A bad rash all over your body  . Dizziness and weakness    Immunizations Administered    Name Date Dose VIS Date Route   Pfizer COVID-19 Vaccine 06/15/2019 11:37 AM 0.3 mL 04/04/2019 Intramuscular   Manufacturer: [employer], [employer]   Lot: [id number]   NDC: [address]-2

## 2019-07-04 ENCOUNTER — Ambulatory Visit (INDEPENDENT_AMBULATORY_CARE_PROVIDER_SITE_OTHER): Payer: Medicare HMO | Admitting: Family Medicine

## 2019-07-04 ENCOUNTER — Encounter: Payer: Self-pay | Admitting: Family Medicine

## 2019-07-04 ENCOUNTER — Other Ambulatory Visit: Payer: Self-pay

## 2019-07-04 VITALS — BP 120/76 | HR 64 | Ht 65.0 in | Wt 210.0 lb

## 2019-07-04 DIAGNOSIS — E7801 Familial hypercholesterolemia: Secondary | ICD-10-CM | POA: Diagnosis not present

## 2019-07-04 DIAGNOSIS — E78019 Familial hypercholesterolemia, unspecified: Secondary | ICD-10-CM | POA: Insufficient documentation

## 2019-07-04 DIAGNOSIS — R1319 Other dysphagia: Secondary | ICD-10-CM | POA: Insufficient documentation

## 2019-07-04 DIAGNOSIS — E6609 Other obesity due to excess calories: Secondary | ICD-10-CM | POA: Diagnosis not present

## 2019-07-04 DIAGNOSIS — Z87898 Personal history of other specified conditions: Secondary | ICD-10-CM

## 2019-07-04 DIAGNOSIS — I10 Essential (primary) hypertension: Secondary | ICD-10-CM

## 2019-07-04 DIAGNOSIS — K219 Gastro-esophageal reflux disease without esophagitis: Secondary | ICD-10-CM | POA: Diagnosis not present

## 2019-07-04 DIAGNOSIS — R928 Other abnormal and inconclusive findings on diagnostic imaging of breast: Secondary | ICD-10-CM | POA: Diagnosis not present

## 2019-07-04 DIAGNOSIS — R1310 Dysphagia, unspecified: Secondary | ICD-10-CM | POA: Diagnosis not present

## 2019-07-04 DIAGNOSIS — Z6832 Body mass index (BMI) 32.0-32.9, adult: Secondary | ICD-10-CM

## 2019-07-04 MED ORDER — PANTOPRAZOLE SODIUM 40 MG PO TBEC: 40.0000 mg | DELAYED_RELEASE_TABLET | Freq: Every day | ORAL | 1 refills | Status: DC

## 2019-07-04 MED ORDER — PROPRANOLOL HCL 20 MG PO TABS: 20.0000 mg | ORAL_TABLET | Freq: Two times a day (BID) | ORAL | 1 refills | Status: DC

## 2019-07-04 NOTE — Patient Instructions (Signed)

## 2019-07-04 NOTE — Progress Notes (Signed)
Date:  07/04/2019   Name:  Lauren Leblanc   DOB:  06/22/1950   MRN:  [id number]   Chief Complaint: Hypertension, Gastroesophageal Reflux, and breast exam (needs mammo and [place] scheduled)  Hypertension This is a chronic problem. The current episode started more than 1 year ago. The problem has been gradually improving since onset. The problem is controlled. Pertinent negatives include no anxiety, blurred vision, chest pain, headaches, malaise/fatigue, neck pain, orthopnea, palpitations, peripheral edema, PND, shortness of breath or sweats. There are no associated agents to hypertension. Risk factors for coronary artery disease include stress. Past treatments include beta blockers. The current treatment provides moderate improvement. There are no compliance problems.  There is no history of angina, kidney disease, CAD/MI, CVA, heart failure, left ventricular hypertrophy, PVD or retinopathy. There is no history of chronic renal disease, a hypertension causing med or renovascular disease.  Gastroesophageal Reflux She reports no abdominal pain, no belching, no chest pain, no choking, no coughing, no dysphagia, no early satiety, no globus sensation, no heartburn, no hoarse voice, no nausea, no sore throat, no stridor, no tooth decay, no water brash or no wheezing. This is a chronic problem. The current episode started more than 1 year ago. The problem occurs rarely. The problem has been gradually improving. The symptoms are aggravated by certain foods. Pertinent negatives include no anemia, fatigue, melena, muscle weakness, orthopnea or weight loss. Risk factors include obesity. She has tried a PPI for the symptoms. The treatment provided moderate relief.    Lab Results  Component Value Date   CREATININE 0.54 12/11/2018   BUN 20 12/11/2018   NA 140 12/11/2018   K 3.7 12/11/2018   CL 106 12/11/2018   CO2 24 12/11/2018   Lab Results  Component Value Date   CHOL 213 (H) 02/20/2018   HDL 58  02/20/2018   LDLCALC 128 (H) 02/20/2018   TRIG 133 02/20/2018   CHOLHDL 5.3 04/25/2017   Lab Results  Component Value Date   TSH 1.359 12/11/2018   No results found for: HGBA1C   Review of Systems  Constitutional: Negative.  Negative for chills, fatigue, fever, malaise/fatigue, unexpected weight change and weight loss.  HENT: Negative for congestion, ear discharge, ear pain, hoarse voice, rhinorrhea, sinus pressure, sneezing and sore throat.   Eyes: Negative for blurred vision, photophobia, pain, discharge, redness and itching.  Respiratory: Negative for cough, choking, shortness of breath, wheezing and stridor.   Cardiovascular: Negative for chest pain, palpitations, orthopnea and PND.  Gastrointestinal: Negative for abdominal pain, blood in stool, constipation, diarrhea, dysphagia, heartburn, melena, nausea and vomiting.  Endocrine: Negative for cold intolerance, heat intolerance, polydipsia, polyphagia and polyuria.  Genitourinary: Negative for dysuria, flank pain, frequency, hematuria, menstrual problem, pelvic pain, urgency, vaginal bleeding and vaginal discharge.  Musculoskeletal: Negative for arthralgias, back pain, myalgias, muscle weakness and neck pain.  Skin: Negative for rash.  Allergic/Immunologic: Negative for environmental allergies and food allergies.  Neurological: Negative for dizziness, weakness, light-headedness, numbness and headaches.  Hematological: Negative for adenopathy. Does not bruise/bleed easily.  Psychiatric/Behavioral: Negative for dysphoric mood. The patient is not nervous/anxious.     Patient Active Problem List   Diagnosis Date Noted  . Chest pain 04/24/2017  . Essential hypertension 01/06/2016    Allergies  Allergen Reactions  . Lisinopril Other (See Comments)    Chest pain, dizziness, cough  . Codeine Hives    "hair crawling" "hair crawling"  . Hydrochlorothiazide Anxiety    Jittery feeling Jittery feeling  Past Surgical History:    Procedure Laterality Date  . BACK SURGERY    . BREAST BIOPSY Bilateral    neg  . TUBAL LIGATION      Social History   Tobacco Use  . Smoking status: Never Smoker  . Smokeless tobacco: Never Used  Substance Use Topics  . Alcohol use: No    Alcohol/week: 0.0 standard drinks  . Drug use: No     Medication list has been reviewed and updated.  Current Meds  Medication Sig  . Lactobacillus Rhamnosus, GG, (CULTURELLE) CAPS Take 1 capsule by mouth 2 (two) times daily.  . mupirocin ointment (BACTROBAN) 2 % Apply 1 application topically 2 (two) times daily.  . pantoprazole (PROTONIX) 40 MG tablet Take 1 tablet (40 mg total) by mouth daily.  . propranolol (INDERAL) 20 MG tablet Take 1 tablet (20 mg total) by mouth 2 (two) times daily.  . [DISCONTINUED] famotidine (PEPCID) 40 MG tablet Take 1 tablet (40 mg total) by mouth daily.    PHQ 2/9 Scores 07/04/2019 04/04/2019 12/06/2018 02/20/2018  PHQ - 2 Score 0 4 0 0  PHQ- 9 Score 2 16 0 -    BP Readings from Last 3 Encounters:  07/04/19 120/76  04/04/19 130/82  12/11/18 (!) 188/90    Physical Exam Vitals and nursing note reviewed.  Constitutional:      General: She is not in acute distress.    Appearance: She is not diaphoretic.  HENT:     Head: Normocephalic and atraumatic.     Right Ear: External ear normal.     Left Ear: External ear normal.     Nose: Nose normal.  Eyes:     General:        Right eye: No discharge.        Left eye: No discharge.     Conjunctiva/sclera: Conjunctivae normal.     Pupils: Pupils are equal, round, and reactive to light.  Neck:     Thyroid: No thyromegaly.     Vascular: No JVD.  Cardiovascular:     Rate and Rhythm: Normal rate and regular rhythm.     Heart sounds: Normal heart sounds. No murmur. No friction rub. No gallop.   Pulmonary:     Effort: Pulmonary effort is normal.     Breath sounds: Normal breath sounds.  Abdominal:     General: Bowel sounds are normal.     Palpations:  Abdomen is soft. There is no mass.     Tenderness: There is no abdominal tenderness. There is no guarding.  Musculoskeletal:        General: Normal range of motion.     Cervical back: Normal range of motion and neck supple.  Lymphadenopathy:     Cervical: No cervical adenopathy.  Skin:    General: Skin is warm and dry.  Neurological:     Mental Status: She is alert.     Deep Tendon Reflexes: Reflexes are normal and symmetric.     Wt Readings from Last 3 Encounters:  07/04/19 210 lb (95.3 kg)  04/04/19 202 lb (91.6 kg)  12/11/18 204 lb (92.5 kg)    BP 120/76   Pulse 64   Ht 5\' 5"  (1.651 m)   Wt 210 lb (95.3 kg)   BMI 34.95 kg/m   Assessment and Plan:  1. Essential hypertension Chronic.  Controlled.  Stable.  Continue propranolol 20 mg twice a day.  Continue renal function panel evaluation. - Renal Function Panel - propranolol (  INDERAL) 20 MG tablet; Take 1 tablet (20 mg total) by mouth 2 (two) times daily.  Dispense: 180 tablet; Refill: 1  2. Abnormal finding on mammography Chronic.  Controlled.  Review of previous mammogram of 2018 was abnormal mammogram of the left breast.  Exam was unremarkable for mass or distortion.  Patient has been referred to diagnostic mammogram and breast exam with ultrasound at the [facility]. - MM DIAG BREAST TOMO BILATERAL; Future - US BREAST LTD UNI LEFT INC AXILLA; Future  3. Gastroesophageal reflux disease, unspecified whether esophagitis present Chronic.  Controlled.  Stable.  Continue pantoprazole 40 mg once a day. - pantoprazole (PROTONIX) 40 MG tablet; Take 1 tablet (40 mg total) by mouth daily.  Dispense: 90 tablet; Refill: 1  4. Esophageal dysphagia Panic.  Controlled.  Stable.  Patient's had a history of esophageal dysphagia requiring that she remain on PPI/Tobrasol 40 mg daily. - pantoprazole (PROTONIX) 40 MG tablet; Take 1 tablet (40 mg total) by mouth daily.  Dispense: 90 tablet; Refill: 1  5. History of abnormal  mammogram As noted above  6. Familial hypercholesterolemia Chronic.  Controlled.  Stable.  Patient has LDL elevation which is controlled with diet. - Lipid Panel With LDL/HDL Ratio  7. Class 1 obesity due to excess calories without serious comorbidity with body mass index (BMI) of 32.0 to 32.9 in adult Health risks of being over weight were discussed and patient was counseled on weight loss options and exercise.  Patient was provided with information on reduction of caloric intake to the 12 100-15 100 range and patient is motivated to do so.

## 2019-07-05 LAB — LIPID PANEL WITH LDL/HDL RATIO
Cholesterol, Total: 223 mg/dL — ABNORMAL HIGH (ref 100–199)
HDL: 51 mg/dL (ref >39)
LDL Chol Calc (NIH): 154 mg/dL — ABNORMAL HIGH (ref 0–99)
LDL/HDL Ratio: 3 ratio (ref 0.0–3.2)
Triglycerides: 101 mg/dL (ref 0–149)
VLDL Cholesterol Cal: 18 mg/dL (ref 5–40)

## 2019-07-05 LAB — RENAL FUNCTION PANEL
Albumin: 4.4 g/dL (ref 3.8–4.8)
BUN/Creatinine Ratio: 16 (ref 12–28)
BUN: 15 mg/dL (ref 8–27)
CO2: 24 mmol/L (ref 20–29)
Calcium: 9.7 mg/dL (ref 8.7–10.3)
Chloride: 107 mmol/L — ABNORMAL HIGH (ref 96–106)
Creatinine, Ser: 0.95 mg/dL (ref 0.57–1.00)
GFR calc Af Amer: 72 mL/min/{1.73_m2} (ref >59)
GFR calc non Af Amer: 63 mL/min/{1.73_m2} (ref >59)
Glucose: 98 mg/dL (ref 65–99)
Phosphorus: 3.4 mg/dL (ref 3.0–4.3)
Potassium: 4.6 mmol/L (ref 3.5–5.2)
Sodium: 145 mmol/L — ABNORMAL HIGH (ref 134–144)

## 2019-07-07 ENCOUNTER — Other Ambulatory Visit: Payer: Self-pay

## 2019-07-07 ENCOUNTER — Encounter: Payer: Self-pay | Admitting: Family Medicine

## 2019-07-07 ENCOUNTER — Ambulatory Visit (INDEPENDENT_AMBULATORY_CARE_PROVIDER_SITE_OTHER): Payer: Medicare HMO | Admitting: Family Medicine

## 2019-07-07 VITALS — BP 120/68 | HR 72 | Ht 65.0 in | Wt 207.0 lb

## 2019-07-07 DIAGNOSIS — L03114 Cellulitis of left upper limb: Secondary | ICD-10-CM

## 2019-07-07 DIAGNOSIS — W5501XA Bitten by cat, initial encounter: Secondary | ICD-10-CM | POA: Diagnosis not present

## 2019-07-07 MED ORDER — AMOXICILLIN-POT CLAVULANATE 875-125 MG PO TABS: 1.0000 | ORAL_TABLET | Freq: Two times a day (BID) | ORAL | 0 refills | Status: DC

## 2019-07-07 NOTE — Progress Notes (Signed)
Date:  07/07/2019   Name:  Lauren Leblanc   DOB:  1950-09-10   MRN:  [id number]   Chief Complaint: cat scratch (looks like cat's teeth scraped L) arm- red oozing a bit and raised, burns and hurts)  Patient is a 69 year old female who presents for a cat scratch/cat bite. The patient reports the following problems: cat bite/scratch. Health maintenance has been reviewed up to date.   Lab Results  Component Value Date   CREATININE 0.95 07/04/2019   BUN 15 07/04/2019   NA 145 (H) 07/04/2019   K 4.6 07/04/2019   CL 107 (H) 07/04/2019   CO2 24 07/04/2019   Lab Results  Component Value Date   CHOL 223 (H) 07/04/2019   HDL 51 07/04/2019   LDLCALC 154 (H) 07/04/2019   TRIG 101 07/04/2019   CHOLHDL 5.3 04/25/2017   Lab Results  Component Value Date   TSH 1.359 12/11/2018   No results found for: HGBA1C   Review of Systems  Constitutional: Negative.  Negative for chills, fatigue, fever and unexpected weight change.  HENT: Negative for congestion, ear discharge, ear pain, rhinorrhea, sinus pressure, sneezing and sore throat.   Eyes: Negative for photophobia, pain, discharge, redness and itching.  Respiratory: Negative for cough, shortness of breath, wheezing and stridor.   Gastrointestinal: Negative for abdominal pain, blood in stool, constipation, diarrhea, nausea and vomiting.  Endocrine: Negative for cold intolerance, heat intolerance, polydipsia, polyphagia and polyuria.  Genitourinary: Negative for dysuria, flank pain, frequency, hematuria, menstrual problem, pelvic pain, urgency, vaginal bleeding and vaginal discharge.  Musculoskeletal: Negative for arthralgias, back pain and myalgias.  Skin: Negative for rash.  Allergic/Immunologic: Negative for environmental allergies and food allergies.  Neurological: Negative for dizziness, weakness, light-headedness, numbness and headaches.  Hematological: Negative for adenopathy. Does not bruise/bleed easily.   Psychiatric/Behavioral: Negative for dysphoric mood. The patient is not nervous/anxious.     Patient Active Problem List   Diagnosis Date Noted  . Esophageal dysphagia 07/04/2019  . Familial hypercholesterolemia 07/04/2019  . Gastroesophageal reflux disease 07/04/2019  . Chest pain 04/24/2017  . Essential hypertension 01/06/2016    Allergies  Allergen Reactions  . Lisinopril Other (See Comments)    Chest pain, dizziness, cough  . Codeine Hives    "hair crawling" "hair crawling"  . Hydrochlorothiazide Anxiety    Jittery feeling Jittery feeling    Past Surgical History:  Procedure Laterality Date  . BACK SURGERY    . BREAST BIOPSY Bilateral    neg  . TUBAL LIGATION      Social History   Tobacco Use  . Smoking status: Never Smoker  . Smokeless tobacco: Never Used  Substance Use Topics  . Alcohol use: No    Alcohol/week: 0.0 standard drinks  . Drug use: No     Medication list has been reviewed and updated.  Current Meds  Medication Sig  . Multiple Vitamins-Minerals (ONE-A-DAY WOMENS 50 PLUS PO) Take 1 tablet by mouth daily.  . mupirocin ointment (BACTROBAN) 2 % Apply 1 application topically 2 (two) times daily.  . pantoprazole (PROTONIX) 40 MG tablet Take 1 tablet (40 mg total) by mouth daily.  . propranolol (INDERAL) 20 MG tablet Take 1 tablet (20 mg total) by mouth 2 (two) times daily.    PHQ 2/9 Scores 07/04/2019 04/04/2019 12/06/2018 02/20/2018  PHQ - 2 Score 0 4 0 0  PHQ- 9 Score 2 16 0 -    BP Readings from Last 3 Encounters:  07/07/19  120/68  07/04/19 120/76  04/04/19 130/82    Physical Exam Vitals and nursing note reviewed.  Constitutional:      Appearance: She is well-developed.  HENT:     Head: Normocephalic.     Right Ear: External ear normal.     Left Ear: External ear normal.     Mouth/Throat:     Mouth: Mucous membranes are moist.  Eyes:     General: Lids are everted, no foreign bodies appreciated. No scleral icterus.       Left  eye: No foreign body or hordeolum.     Conjunctiva/sclera: Conjunctivae normal.     Right eye: Right conjunctiva is not injected.     Left eye: Left conjunctiva is not injected.     Pupils: Pupils are equal, round, and reactive to light.  Neck:     Thyroid: No thyromegaly.     Vascular: No JVD.     Trachea: No tracheal deviation.  Cardiovascular:     Rate and Rhythm: Normal rate and regular rhythm.     Heart sounds: Normal heart sounds. No murmur. No friction rub. No gallop.   Pulmonary:     Effort: Pulmonary effort is normal. No respiratory distress.     Breath sounds: Normal breath sounds. No wheezing or rales.  Chest:     Comments: Tender/no adenopathy Abdominal:     General: Bowel sounds are normal.     Palpations: Abdomen is soft. There is no mass.     Tenderness: There is no abdominal tenderness. There is no guarding or rebound.  Musculoskeletal:        General: No tenderness. Normal range of motion.     Cervical back: Normal range of motion and neck supple.  Lymphadenopathy:     Cervical: No cervical adenopathy.     Upper Body:     Left upper body: Axillary adenopathy present.  Skin:    General: Skin is warm.     Findings: Erythema present. No rash.  Neurological:     Mental Status: She is alert and oriented to person, place, and time.     Cranial Nerves: No cranial nerve deficit.     Deep Tendon Reflexes: Reflexes normal.  Psychiatric:        Mood and Affect: Mood is not anxious or depressed.     Wt Readings from Last 3 Encounters:  07/07/19 202 lb (91.6 kg)  07/04/19 210 lb (95.3 kg)  04/04/19 202 lb (91.6 kg)    BP 120/68   Pulse 72   Ht 5\' 10"  (1.778 m)   Wt 202 lb (91.6 kg)   BMI 28.98 kg/m   Assessment and Plan: 1. Cellulitis of left upper extremity Patient sustained likely a bite and some scratches to the left upper forearm from her cat.  There is a small area of erythema on the forearm as well as some tenderness in the right axillary area.  The  cat is up-to-date on immunizations and has a pattern of attention and getting that involves little bites and scratches.  Has been discussed with patient about maybe having the the cat declawed.  Patient is also up-to-date on her tetanus.  We will treat with Augmentin 875 mg twice a day for 10 days and if too expensive have suggested on the prescription replacement with doxycycline 100 mg twice a day.  2. Cat bite, initial encounter Patient has a cat bite that was not provoked yet cat is known to gain attention by nipping  and scratching.

## 2019-07-09 ENCOUNTER — Ambulatory Visit: Payer: Medicare HMO | Attending: Internal Medicine

## 2019-07-09 ENCOUNTER — Ambulatory Visit: Payer: Medicare HMO

## 2019-07-09 DIAGNOSIS — Z23 Encounter for immunization: Secondary | ICD-10-CM

## 2019-07-09 NOTE — Progress Notes (Signed)
   Covid-[facility] Vaccination [facility]  Name:  Lauren Leblanc    MRN: [id number] DOB: 08/19/1950  07/09/2019  [name] was observed post Covid-19 immunization for 15 minutes without incident. She was provided with Vaccine Information Sheet and instruction to access the V-Safe system.   [name] was instructed to call 911 with any severe reactions post vaccine: Marland Kitchen Difficulty breathing  . Swelling of face and throat  . A fast heartbeat  . A bad rash all over body  . Dizziness and weakness   Immunizations Administered    Name Date Dose VIS Date Route   Pfizer COVID-19 Vaccine 07/09/2019  9:40 AM 0.3 mL 04/04/2019 Intramuscular   Manufacturer: [employer], [employer]   Lot: [id number]   NDC: [address]-4

## 2019-07-16 ENCOUNTER — Ambulatory Visit (INDEPENDENT_AMBULATORY_CARE_PROVIDER_SITE_OTHER): Payer: Medicare HMO

## 2019-07-16 VITALS — Ht 65.0 in | Wt 201.8 lb

## 2019-07-16 DIAGNOSIS — Z780 Asymptomatic menopausal state: Secondary | ICD-10-CM | POA: Diagnosis not present

## 2019-07-16 DIAGNOSIS — Z0000 Encounter for general adult medical examination without abnormal findings: Secondary | ICD-10-CM

## 2019-07-16 NOTE — Patient Instructions (Addendum)
Lauren Leblanc , Thank you for taking time to come for your Medicare Wellness Visit. I appreciate your ongoing commitment to your health goals. Please review the following plan we discussed and let me know if I can assist you in the future.   Screening recommendations/referrals: Colonoscopy: done 07/29/17 Mammogram: done 03/22/17.  Bone Density: Please call [phone] to schedule your bone density screening.  Recommended yearly ophthalmology/optometry visit for glaucoma screening and checkup Recommended yearly dental visit for hygiene and checkup  Vaccinations: Influenza vaccine: postponed Pneumococcal vaccine: due Tdap vaccine: done 12/06/18 Shingles vaccine: Shingrix discussed. Please contact your pharmacy for coverage information.  Covid-19: done 06/15/19 & 07/09/19   Advanced directives: Advance directive discussed with you today. I have provided a copy for you to complete at home and have notarized. Once this is complete please bring a copy in to our office so we can scan it into your chart.  Conditions/risks identified: Recommend healthy eating and physical activity for desired weight loss.   Next appointment: Please follow up in one year for your Medicare Annual Wellness visit.     Preventive Care 23 Years and Older, Female Preventive care refers to lifestyle choices and visits with your health care provider that can promote health and wellness. What does preventive care include?  A yearly physical exam. This is also called an annual well check.  Dental exams once or twice a year.  Routine eye exams. Ask your health care provider how often you should have your eyes checked.  Personal lifestyle choices, including:  Daily care of your teeth and gums.  Regular physical activity.  Eating a healthy diet.  Avoiding tobacco and drug use.  Limiting alcohol use.  Practicing safe sex.  Taking low-dose aspirin every day.  Taking vitamin and mineral supplements as  recommended by your health care provider. What happens during an annual well check? The services and screenings done by your health care provider during your annual well check will depend on your age, overall health, lifestyle risk factors, and family history of disease. Counseling  Your health care provider may ask you questions about your:  Alcohol use.  Tobacco use.  Drug use.  Emotional well-being.  Home and relationship well-being.  Sexual activity.  Eating habits.  History of falls.  Memory and ability to understand (cognition).  Work and work Astronomer.  Reproductive health. Screening  You may have the following tests or measurements:  Height, weight, and BMI.  Blood pressure.  Lipid and cholesterol levels. These may be checked every 5 years, or more frequently if you are over 36 years old.  Skin check.  Lung cancer screening. You may have this screening every year starting at age 16 if you have a 30-pack-year history of smoking and currently smoke or have quit within the past 15 years.  Fecal occult blood test (FOBT) of the stool. You may have this test every year starting at age 72.  Flexible sigmoidoscopy or colonoscopy. You may have a sigmoidoscopy every 5 years or a colonoscopy every 10 years starting at age 76.  Hepatitis C blood test.  Hepatitis B blood test.  Sexually transmitted disease (STD) testing.  Diabetes screening. This is done by checking your blood sugar (glucose) after you have not eaten for a while (fasting). You may have this done every 1-3 years.  Bone density scan. This is done to screen for osteoporosis. You may have this done starting at age 29.  Mammogram. This may be done every 1-2 years. Talk  to your health care provider about how often you should have regular mammograms. Talk with your health care provider about your test results, treatment options, and if necessary, the need for more tests. Vaccines  Your health care  provider may recommend certain vaccines, such as:  Influenza vaccine. This is recommended every year.  Tetanus, diphtheria, and acellular pertussis (Tdap, Td) vaccine. You may need a Td booster every 10 years.  Zoster vaccine. You may need this after age 65.  Pneumococcal 13-valent conjugate (PCV13) vaccine. One dose is recommended after age 48.  Pneumococcal polysaccharide (PPSV23) vaccine. One dose is recommended after age 39. Talk to your health care provider about which screenings and vaccines you need and how often you need them. This information is not intended to replace advice given to you by your health care provider. Make sure you discuss any questions you have with your health care provider. Document Released: 05/07/2015 Document Revised: 12/29/2015 Document Reviewed: 02/09/2015 Elsevier Interactive Patient Education  2017 [employer] Prevention in the Home Falls can cause injuries. They can happen to people of all ages. There are many things you can do to make your home safe and to help prevent falls. What can I do on the outside of my home?  Regularly fix the edges of walkways and driveways and fix any cracks.  Remove anything that might make you trip as you walk through a door, such as a raised step or threshold.  Trim any bushes or trees on the path to your home.  Use bright outdoor lighting.  Clear any walking paths of anything that might make someone trip, such as rocks or tools.  Regularly check to see if handrails are loose or broken. Make sure that both sides of any steps have handrails.  Any raised decks and porches should have guardrails on the edges.  Have any leaves, snow, or ice cleared regularly.  Use sand or salt on walking paths during winter.  Clean up any spills in your garage right away. This includes oil or grease spills. What can I do in the bathroom?  Use night lights.  Install grab bars by the toilet and in the tub and shower. Do  not use towel bars as grab bars.  Use non-skid mats or decals in the tub or shower.  If you need to sit down in the shower, use a plastic, non-slip stool.  Keep the floor dry. Clean up any water that spills on the floor as soon as it happens.  Remove soap buildup in the tub or shower regularly.  Attach bath mats securely with double-sided non-slip rug tape.  Do not have throw rugs and other things on the floor that can make you trip. What can I do in the bedroom?  Use night lights.  Make sure that you have a light by your bed that is easy to reach.  Do not use any sheets or blankets that are too big for your bed. They should not hang down onto the floor.  Have a firm chair that has side arms. You can use this for support while you get dressed.  Do not have throw rugs and other things on the floor that can make you trip. What can I do in the kitchen?  Clean up any spills right away.  Avoid walking on wet floors.  Keep items that you use a lot in easy-to-reach places.  If you need to reach something above you, use a strong step stool that  has a grab bar.  Keep electrical cords out of the way.  Do not use floor polish or wax that makes floors slippery. If you must use wax, use non-skid floor wax.  Do not have throw rugs and other things on the floor that can make you trip. What can I do with my stairs?  Do not leave any items on the stairs.  Make sure that there are handrails on both sides of the stairs and use them. Fix handrails that are broken or loose. Make sure that handrails are as long as the stairways.  Check any carpeting to make sure that it is firmly attached to the stairs. Fix any carpet that is loose or worn.  Avoid having throw rugs at the top or bottom of the stairs. If you do have throw rugs, attach them to the floor with carpet tape.  Make sure that you have a light switch at the top of the stairs and the bottom of the stairs. If you do not have them,  ask someone to add them for you. What else can I do to help prevent falls?  Wear shoes that:  Do not have high heels.  Have rubber bottoms.  Are comfortable and fit you well.  Are closed at the toe. Do not wear sandals.  If you use a stepladder:  Make sure that it is fully opened. Do not climb a closed stepladder.  Make sure that both sides of the stepladder are locked into place.  Ask someone to hold it for you, if possible.  Clearly mark and make sure that you can see:  Any grab bars or handrails.  First and last steps.  Where the edge of each step is.  Use tools that help you move around (mobility aids) if they are needed. These include:  Canes.  Walkers.  Scooters.  Crutches.  Turn on the lights when you go into a dark area. Replace any light bulbs as soon as they burn out.  Set up your furniture so you have a clear path. Avoid moving your furniture around.  If any of your floors are uneven, fix them.  If there are any pets around you, be aware of where they are.  Review your medicines with your doctor. Some medicines can make you feel dizzy. This can increase your chance of falling. Ask your doctor what other things that you can do to help prevent falls. This information is not intended to replace advice given to you by your health care provider. Make sure you discuss any questions you have with your health care provider. Document Released: 02/04/2009 Document Revised: 09/16/2015 Document Reviewed: 05/15/2014 Elsevier Interactive Patient Education  2017 [employer].

## 2019-07-16 NOTE — Progress Notes (Signed)
Subjective:   Lauren Leblanc is a 69 y.o. female who presents for an Initial Medicare Annual Wellness Visit.  Virtual Visit via Telephone Note  I connected with Lauren Leblanc on 07/16/19 at  9:20 AM EDT by telephone and verified that I am speaking with the correct person using two identifiers.  Medicare Annual Wellness visit completed telephonically due to Covid-19 pandemic.   Location: Patient: home Provider: office   I discussed the limitations, risks, security and privacy concerns of performing an evaluation and management service by telephone and the availability of in person appointments. The patient expressed understanding and agreed to proceed.  Some vital signs may be absent or patient reported.   Reather Littler, LPN    Review of Systems      Cardiac Risk Factors include: obesity (BMI >30kg/m2);advanced age (>80men, >99 women)     Objective:    Today's Vitals   07/16/19 0928  Weight: 201 lb 12.8 oz (91.5 kg)  Height: 5\' 5"  (1.651 m)   Body mass index is 33.58 kg/m.  Advanced Directives 07/16/2019 12/11/2018 06/27/2017 04/24/2017 04/24/2017 01/11/2016 01/10/2016  Does Patient Have a Medical Advance Directive? No No No No No No No  Would patient like information on creating a medical advance directive? Yes (MAU/Ambulatory/Procedural Areas - Information given) No - Patient declined - No - Patient declined - - -    Current Medications (verified) Outpatient Encounter Medications as of 07/16/2019  Medication Sig  . amoxicillin-clavulanate (AUGMENTIN) 875-125 MG tablet Take 1 tablet by mouth 2 (two) times daily.  . Multiple Vitamins-Minerals (ONE-A-DAY WOMENS 50 PLUS PO) Take 1 tablet by mouth daily.  . mupirocin ointment (BACTROBAN) 2 % Apply 1 application topically 2 (two) times daily.  . pantoprazole (PROTONIX) 40 MG tablet Take 1 tablet (40 mg total) by mouth daily.  . propranolol (INDERAL) 20 MG tablet Take 1 tablet (20 mg total) by mouth 2 (two) times daily.    No facility-administered encounter medications on file as of 07/16/2019.    Allergies (verified) Lisinopril, Codeine, and Hydrochlorothiazide   History: Past Medical History:  Diagnosis Date  . 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 GERD (gastroesophageal reflux disease)   . Hypertension    Past Surgical History:  Procedure Laterality Date  . BACK SURGERY    . BREAST BIOPSY Bilateral    neg  . TUBAL LIGATION     Family History  Problem Relation Age of Onset  . Coronary artery disease Mother 45  . Hypertension Mother   . Stroke Father   . Diabetes Father   . Hypertension Sister   . Allergies Sister   . Breast cancer Maternal Aunt        3 mat aunts   Social History   Socioeconomic History  . Marital status: Married    Spouse name: Not on file  . Number of children: Not on file  . Years of education: Not on file  . Highest education level: Not on file  Occupational History  . Not on file  Tobacco Use  . Smoking status: Never Smoker  . Smokeless tobacco: Never Used  Substance and Sexual Activity  . Alcohol use: No    Alcohol/week: 0.0 standard drinks  . Drug use: No  . Sexual activity: Not Currently  Other Topics Concern  . Not on file  Social History Narrative  . Not on file   Social Determinants of Health   Financial Resource Strain: Low Risk   . Difficulty of  Paying Living Expenses: Not hard at all  Food Insecurity: No Food Insecurity  . Worried About Charity fundraiser in the Last Year: Never true  . Ran Out of Food in the Last Year: Never true  Transportation Needs: No Transportation Needs  . Lack of Transportation (Medical): No  . Lack of Transportation (Non-Medical): No  Physical Activity: Inactive  . Days of Exercise per Week: 0 days  . Minutes of Exercise per Session: 0 min  Stress: Stress Concern Present  . Feeling of Stress : To some extent  Social Connections: Unknown  . Frequency of Communication with Friends and Family: Not on file  . Frequency  of Social Gatherings with Friends and Family: Not on file  . Attends Religious Services: Not on file  . Active Member of Clubs or Organizations: Not on file  . Attends Archivist Meetings: Not on file  . Marital Status: Married    Tobacco Counseling Counseling given: Not Answered   Clinical Intake:  Pre-visit preparation completed: Yes  Pain : No/denies pain     BMI - recorded: 33.58 Nutritional Status: BMI > 30  Obese Nutritional Risks: None Diabetes: No  How often do you need to have someone help you when you read instructions, pamphlets, or other written materials from your doctor or pharmacy?: 1 - Never  Interpreter Needed?: No  Information entered by :: Clemetine Marker LPN   Activities of Daily Living In your present state of health, do you have any difficulty performing the following activities: 07/16/2019  Hearing? N  Comment declines hearing aids  Vision? N  Difficulty concentrating or making decisions? N  Walking or climbing stairs? N  Dressing or bathing? N  Doing errands, shopping? N  Preparing Food and eating ? N  Using the Toilet? N  In the past six months, have you accidently leaked urine? Y  Comment wears pads for protection  Do you have problems with loss of bowel control? N  Managing your Medications? N  Managing your Finances? N  Housekeeping or managing your Housekeeping? N  Some recent data might be hidden     Immunizations and Health Maintenance Immunization History  Administered Date(s) Administered  . Influenza, High Dose Seasonal PF 02/01/2018  . Influenza, Seasonal, Injecte, Preservative Fre 05/10/2012  . Influenza,inj,Quad PF,6+ Mos 02/01/2017  . PFIZER SARS-COV-2 Vaccination 06/15/2019, 07/09/2019  . Pneumococcal Conjugate-13 02/01/2018  . Tdap 12/06/2018   Health Maintenance Due  Topic Date Due  . MAMMOGRAM  03/23/2019    Patient Care Team: Juline Patch, MD as PCP - General (Family Medicine)  Indicate any recent  Medical Services you may have received from other than [facility] providers in the past year (date may be approximate).     Assessment:   This is a routine wellness examination for Lauren Leblanc.  Hearing/Vision screen  Hearing Screening   125Hz  250Hz  500Hz  1000Hz  2000Hz  3000Hz  4000Hz  6000Hz  8000Hz   Right ear:           Left ear:           Comments: Pt denies hearing difficulty  Vision Screening Comments: Vision screenings done with Dr. Wyatt Portela, past due for exam   Dietary issues and exercise activities discussed: Current Exercise Habits: The patient does not participate in regular exercise at present, Exercise limited by: orthopedic condition(s)  Goals    . Weight (lb) < 175 lb (79.4 kg)     Pt would like to lose weight over the next year with diet  and exercise.       Depression Screen PHQ 2/9 Scores 07/16/2019 07/04/2019 04/04/2019 12/06/2018 02/20/2018 02/01/2017 08/12/2015  PHQ - 2 Score 0 0 4 0 0 0 0  PHQ- 9 Score - 2 16 0 - 1 -    Fall Risk Fall Risk  07/16/2019 02/20/2018 02/01/2017 08/12/2015 06/21/2015  Falls in the past year? 1 No No No No  Number falls in past yr: 0 - - - -  Comment tripped over water hose - - - -  Injury with Fall? 0 - - - -  Risk for fall due to : Orthopedic patient - - - -  Follow up Falls prevention discussed - - - -    FALL RISK PREVENTION PERTAINING TO THE HOME:  Any stairs in or around the home? Yes  If so, do they handrails? Yes - needs more  Home free of loose throw rugs in walkways, pet beds, electrical cords, etc? Yes  Adequate lighting in your home to reduce risk of falls? Yes   ASSISTIVE DEVICES UTILIZED TO PREVENT FALLS:  Life alert? No  Use of a cane, walker or w/c? No  Grab bars in the bathroom? No  Shower chair or bench in shower? Yes  Elevated toilet seat or a handicapped toilet? Yes   DME ORDERS:  DME order needed?  No   TIMED UP AND GO:  Was the test performed? No . Telephonic visit.   Education: Fall risk prevention has been  discussed.  Intervention(s) required? No   Cognitive Function:     6CIT Screen 07/16/2019  What Year? 0 points  What month? 0 points  What time? 0 points  Count back from 20 0 points  Months in reverse 0 points  Repeat phrase 0 points  Total Score 0    Screening Tests Health Maintenance  Topic Date Due  . MAMMOGRAM  03/23/2019  . INFLUENZA VACCINE  07/29/2019 (Originally 11/23/2018)  . PNA vac Low Risk Adult (2 of 2 - PPSV23) 07/29/2019 (Originally 02/02/2019)  . DEXA SCAN  07/03/2020 (Originally 07/17/2017)  . Hepatitis C Screening  07/03/2020 (Originally 12-26-1950)  . COLONOSCOPY  07/30/2027  . TETANUS/TDAP  12/05/2028    Qualifies for Shingles Vaccine? Yes . Due for Shingrix. Education has been provided regarding the importance of this vaccine. Pt has been advised to call insurance company to determine out of pocket expense. Advised may also receive vaccine at local pharmacy or Health Dept. Verbalized acceptance and understanding.  Tdap: Up to date  Flu Vaccine: Due for Flu vaccine. Does the patient want to receive this vaccine today?  No . Education has been provided regarding the importance of this vaccine but still declined. Advised may receive this vaccine at local pharmacy or Health Dept. Aware to provide a copy of the vaccination record if obtained from local pharmacy or Health Dept. Verbalized acceptance and understanding.  Pneumococcal Vaccine: Due for Pneumococcal vaccine. Does the patient want to receive this vaccine today?  No . Education has been provided regarding the importance of this vaccine but still declined. Advised may receive this vaccine at local pharmacy or Health Dept. Aware to provide a copy of the vaccination record if obtained from local pharmacy or Health Dept. Verbalized acceptance and understanding.   Cancer Screenings:  Colorectal Screening: Completed 07/29/17. Repeat every 10 years;   Mammogram: Completed 03/22/17. Repeat every year; scheduled for  07/22/19    Bone Density: DUE. Ordered today. Pt provided with contact information and advised to call  to schedule appt.   Lung Cancer Screening: (Low Dose CT Chest recommended if Age 23-80 years, 30 pack-year currently smoking OR have quit w/in 15years.) does not qualify.   Additional Screening:  Hepatitis C Screening: does qualify; due  Vision Screening: Recommended annual ophthalmology exams for early detection of glaucoma and other disorders of the eye. Is the patient up to date with their annual eye exam?  No  Who is the provider or what is the name of the office in which the pt attends annual eye exams? Dr. Clearance Coots  Dental Screening: Recommended annual dental exams for proper oral hygiene  Community Resource Referral:  CRR required this visit?  No       Plan:    I have personally reviewed and addressed the Medicare Annual Wellness questionnaire and have noted the following in the patient's chart:  A. Medical and social history B. Use of alcohol, tobacco or illicit drugs  C. Current medications and supplements D. Functional ability and status E.  Nutritional status F.  Physical activity G. Advance directives H. List of other physicians I.  Hospitalizations, surgeries, and ER visits in previous 12 months J.  Vitals K. Screenings such as hearing and vision if needed, cognitive and depression L. Referrals and appointments   In addition, I have reviewed and discussed with patient certain preventive protocols, quality metrics, and best practice recommendations. A written personalized care plan for preventive services as well as general preventive health recommendations were provided to patient.   Signed,  Reather Littler, LPN Nurse Health Advisor   Nurse Notes: pt reports healing well since cat scratch and completing antibiotics

## 2019-07-22 ENCOUNTER — Ambulatory Visit
Admission: RE | Admit: 2019-07-22 | Discharge: 2019-07-22 | Disposition: A | Discharge status: Home / Self Care | Payer: Medicare HMO | Source: Ambulatory Visit | Attending: Family Medicine | Admitting: Family Medicine

## 2019-07-22 DIAGNOSIS — N6324 Unspecified lump in the left breast, lower inner quadrant: Secondary | ICD-10-CM | POA: Diagnosis not present

## 2019-07-22 DIAGNOSIS — R928 Other abnormal and inconclusive findings on diagnostic imaging of breast: Secondary | ICD-10-CM

## 2019-09-02 ENCOUNTER — Other Ambulatory Visit: Payer: Self-pay

## 2019-09-02 ENCOUNTER — Ambulatory Visit
Admission: RE | Admit: 2019-09-02 | Discharge: 2019-09-02 | Disposition: A | Discharge status: Home / Self Care | Payer: Medicare HMO | Source: Ambulatory Visit | Attending: Family Medicine | Admitting: Family Medicine

## 2019-09-02 DIAGNOSIS — Z780 Asymptomatic menopausal state: Secondary | ICD-10-CM | POA: Diagnosis not present

## 2019-09-02 DIAGNOSIS — M85851 Other specified disorders of bone density and structure, right thigh: Secondary | ICD-10-CM | POA: Diagnosis not present

## 2019-09-09 DIAGNOSIS — M1712 Unilateral primary osteoarthritis, left knee: Secondary | ICD-10-CM | POA: Diagnosis not present

## 2019-10-29 IMAGING — US US BREAST*L* LIMITED INC AXILLA
1 series · 9 of 9 positions shown · non-contrast
Comparison: Previous exam(s).

CLINICAL DATA: Callback from screening mammogram for possible
asymmetries in both breasts.

EXAM:
2D DIGITAL DIAGNOSTIC BILATERAL MAMMOGRAM WITH CAD AND ADJUNCT TOMO
ULTRASOUND BILATERAL BREAST

[Series 1: us breast*left* limited inc axilla · 0.07mm/px · 9 of 9 slices shown]
[im 1/9]
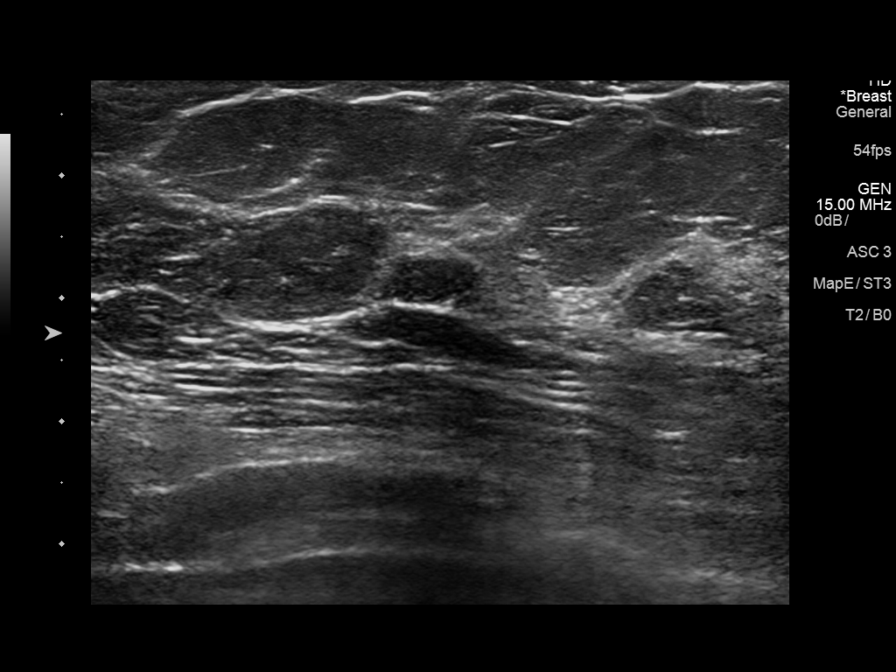
[im 2/9]
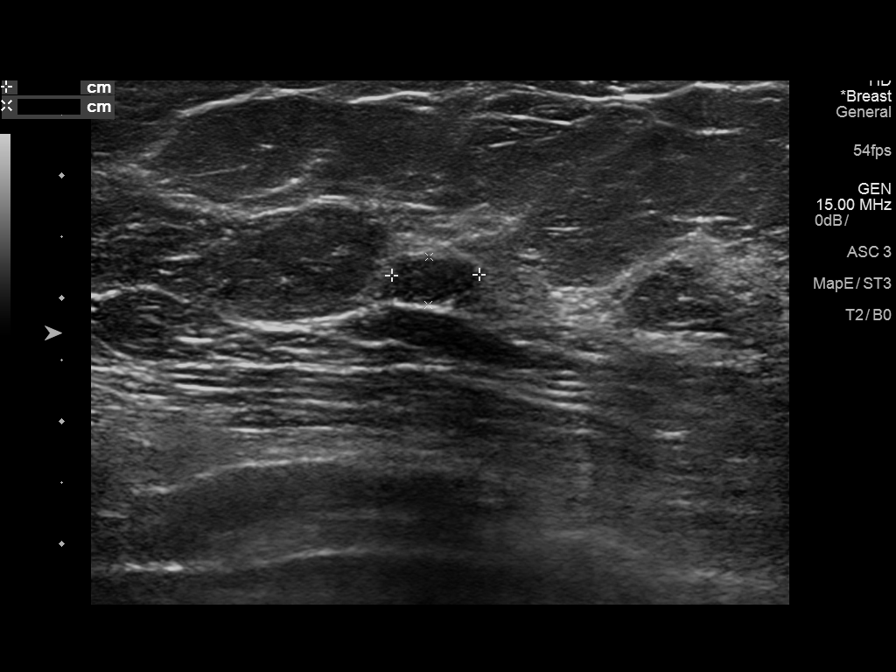
[im 3/9]
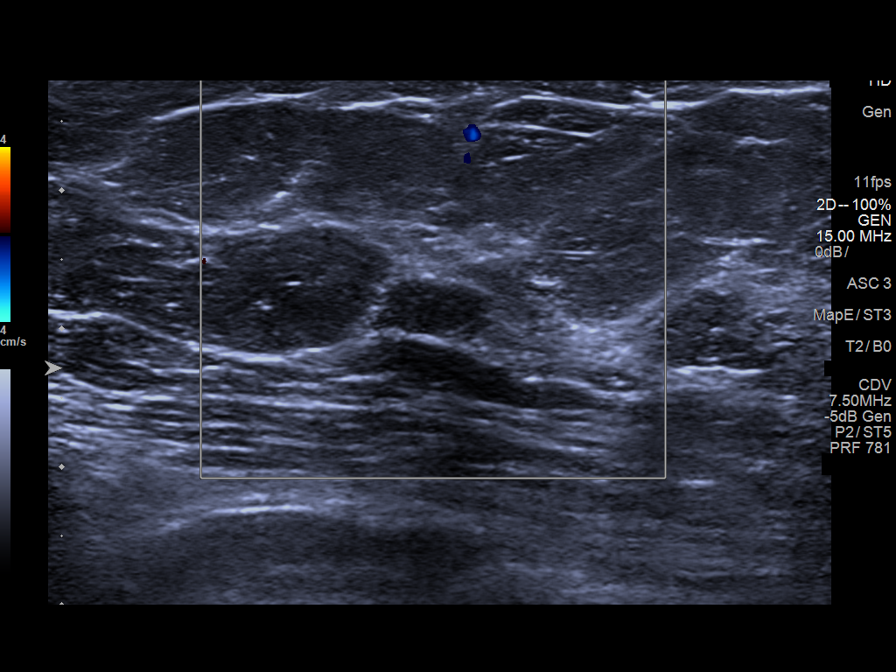
[im 4/9]
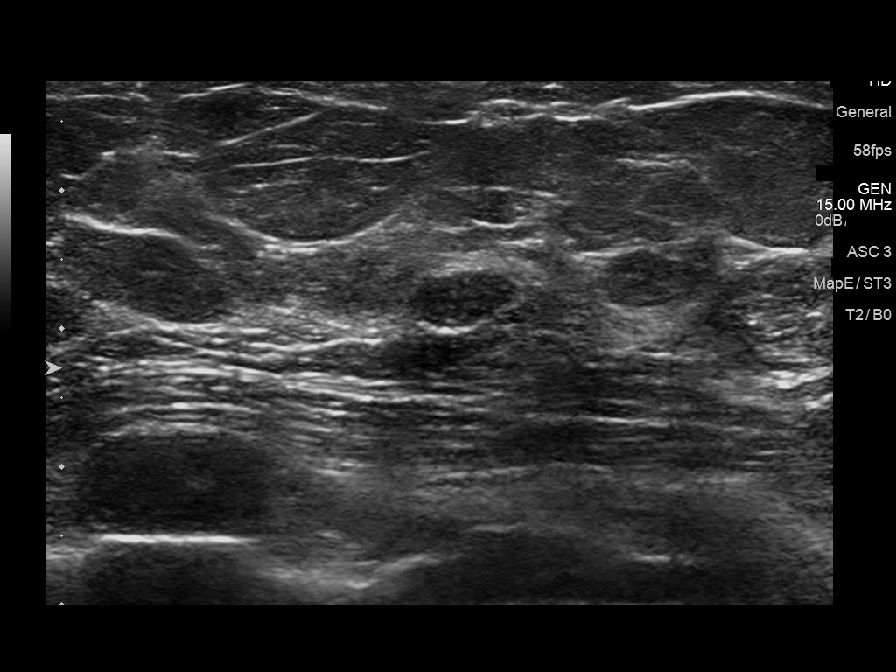
[im 5/9]
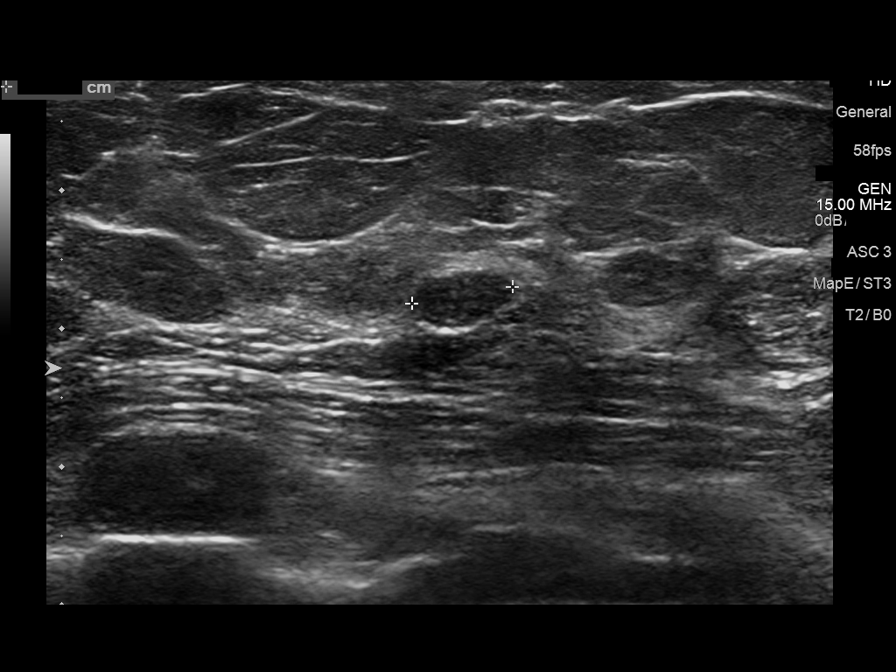
[im 6/9]
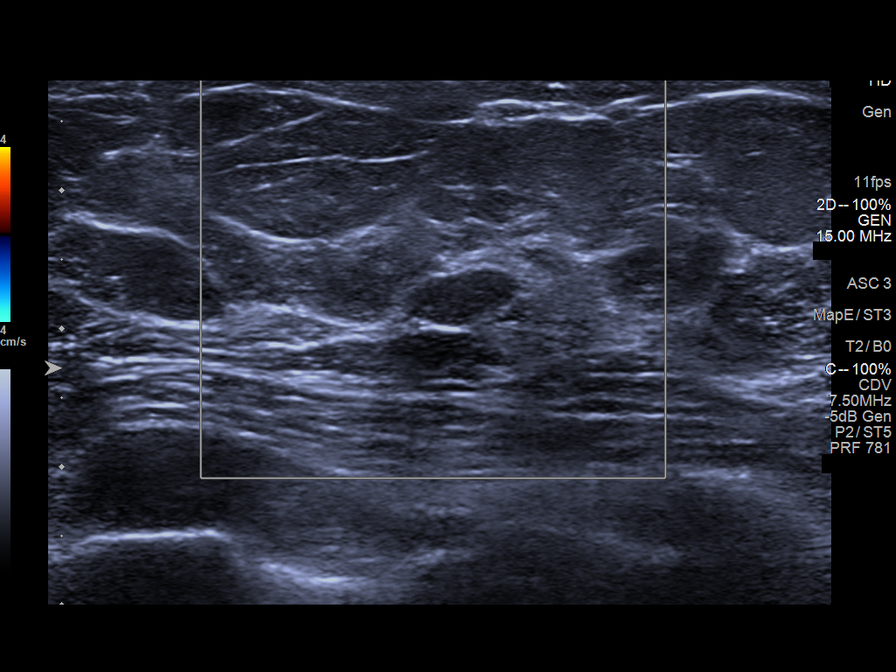
[im 7/9]
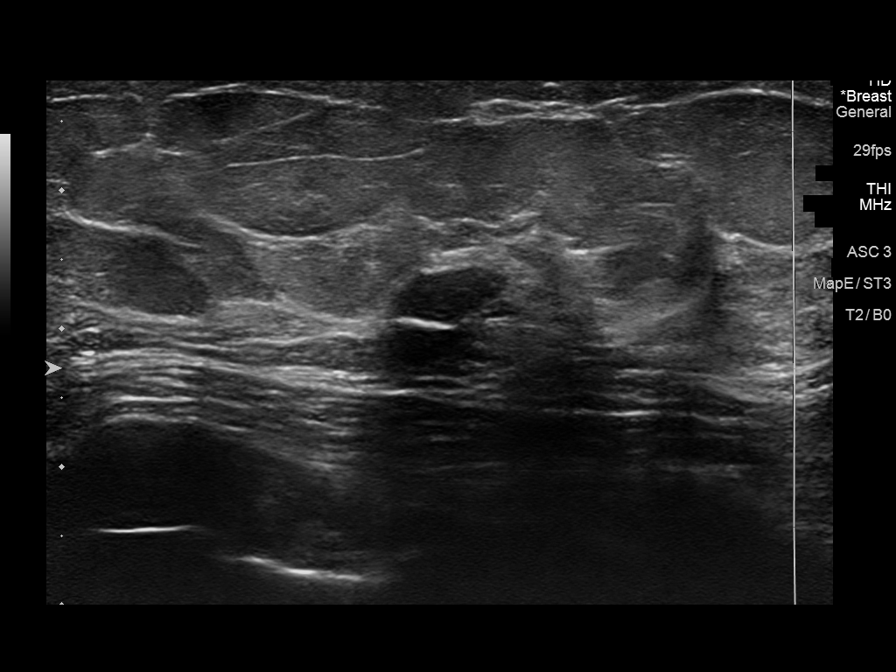
[im 8/9]
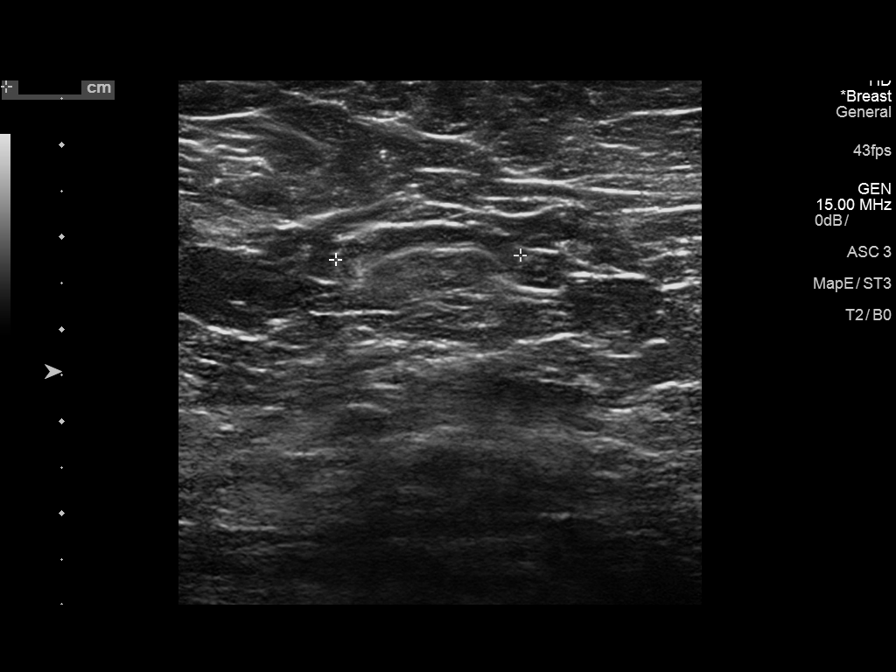
[im 9/9]
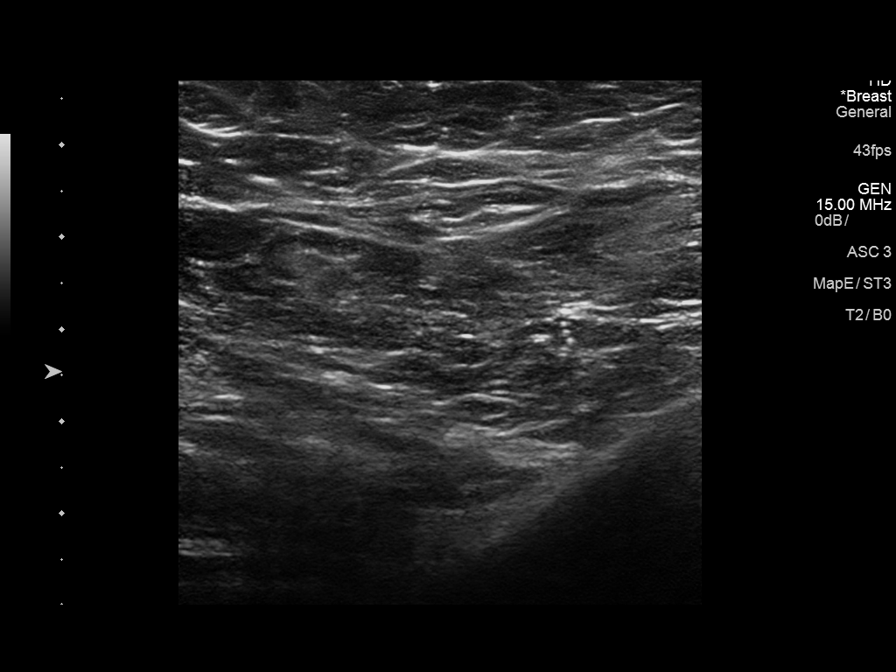

[9 of 9 positions shown; findings below may reference images not displayed]

ACR Breast Density Category b: There are scattered areas of
fibroglandular density.
FINDINGS: Lateral views of bilateral breasts, spot compression right view,
spot compression left MLO view are submitted. The previously
questioned mass in the central right breast persists on additional
views. The previously questioned mass in the slight lower left
breast persists on additional views.

Mammographic images were processed with CAD.

Targeted ultrasound is performed, showing 0.74 cm oval hypoechoic
lesion at the left breast 8 o'clock 3 cm from nipple likely
correlating to the mammographic mass. No other focal discrete
abnormal cystic or solid lesions identified within the lower left
breast.

At the right breast 12 o'clock 1 cm from nipple, there is a 0.3 cm
minimal complicated cyst correlating to the mammographic mass. This
is benign.
IMPRESSION: Probable benign findings.

RECOMMENDATION:
Six-month follow-up mammogram and ultrasound of left breast.

I have discussed the findings and recommendations with the patient.
Results were also provided in writing at the conclusion of the
visit. If applicable, a reminder letter will be sent to the patient
regarding the next appointment.

BI-RADS CATEGORY  3: Probably benign.

## 2019-11-11 DIAGNOSIS — I781 Nevus, non-neoplastic: Secondary | ICD-10-CM | POA: Diagnosis not present

## 2019-11-11 DIAGNOSIS — L918 Other hypertrophic disorders of the skin: Secondary | ICD-10-CM | POA: Diagnosis not present

## 2019-11-26 DIAGNOSIS — M1712 Unilateral primary osteoarthritis, left knee: Secondary | ICD-10-CM | POA: Diagnosis not present

## 2020-01-27 ENCOUNTER — Other Ambulatory Visit: Payer: Self-pay

## 2020-01-27 ENCOUNTER — Encounter: Payer: Self-pay | Admitting: Family Medicine

## 2020-01-27 ENCOUNTER — Ambulatory Visit (INDEPENDENT_AMBULATORY_CARE_PROVIDER_SITE_OTHER): Payer: Medicare HMO | Admitting: Family Medicine

## 2020-01-27 VITALS — BP 160/98 | HR 68 | Ht 65.0 in | Wt 212.0 lb

## 2020-01-27 DIAGNOSIS — R1319 Other dysphagia: Secondary | ICD-10-CM | POA: Diagnosis not present

## 2020-01-27 DIAGNOSIS — I10 Essential (primary) hypertension: Secondary | ICD-10-CM

## 2020-01-27 DIAGNOSIS — K219 Gastro-esophageal reflux disease without esophagitis: Secondary | ICD-10-CM | POA: Diagnosis not present

## 2020-01-27 MED ORDER — AMLODIPINE BESYLATE 2.5 MG PO TABS: 2.5000 mg | ORAL_TABLET | Freq: Every day | ORAL | 3 refills | Status: DC

## 2020-01-27 MED ORDER — PANTOPRAZOLE SODIUM 40 MG PO TBEC: 40.0000 mg | DELAYED_RELEASE_TABLET | Freq: Every day | ORAL | 1 refills | Status: DC

## 2020-01-27 MED ORDER — PROPRANOLOL HCL 20 MG PO TABS: 20.0000 mg | ORAL_TABLET | Freq: Two times a day (BID) | ORAL | 1 refills | Status: DC

## 2020-01-27 NOTE — Progress Notes (Signed)
Date:  01/27/2020   Name:  Lauren Leblanc   DOB:  17-Aug-1950   MRN:  [id number]   Chief Complaint: Hypertension (b/p has been high) and Gastroesophageal Reflux  Hypertension This is a chronic problem. The current episode started more than 1 year ago. The problem has been waxing and waning since onset. The problem is uncontrolled. Associated symptoms include blurred vision and headaches. Pertinent negatives include no anxiety, chest pain, malaise/fatigue, neck pain, orthopnea, palpitations, peripheral edema, PND, shortness of breath or sweats. Associated agents: nsaid. Risk factors for coronary artery disease include dyslipidemia. Past treatments include beta blockers. The current treatment provides moderate improvement. There are no compliance problems.  There is no history of angina, kidney disease, CAD/MI, CVA, heart failure, left ventricular hypertrophy, PVD or retinopathy. There is no history of chronic renal disease, a hypertension causing med or renovascular disease.  Gastroesophageal Reflux She reports no abdominal pain, no chest pain, no choking, no coughing, no dysphagia, no heartburn, no nausea, no sore throat or no wheezing. This is a chronic problem. The problem occurs occasionally. Pertinent negatives include no fatigue. She has tried a PPI for the symptoms. The treatment provided moderate relief.    Lab Results  Component Value Date   CREATININE 0.95 07/04/2019   BUN 15 07/04/2019   NA 145 (H) 07/04/2019   K 4.6 07/04/2019   CL 107 (H) 07/04/2019   CO2 24 07/04/2019   Lab Results  Component Value Date   CHOL 223 (H) 07/04/2019   HDL 51 07/04/2019   LDLCALC 154 (H) 07/04/2019   TRIG 101 07/04/2019   CHOLHDL 5.3 04/25/2017   Lab Results  Component Value Date   TSH 1.359 12/11/2018   No results found for: HGBA1C Lab Results  Component Value Date   WBC 9.7 12/11/2018   HGB 12.7 12/11/2018   HCT 37.3 12/11/2018   MCV 86.7 12/11/2018   PLT 269 12/11/2018    Lab Results  Component Value Date   ALT 12 12/11/2018   AST 20 12/11/2018   ALKPHOS 63 12/11/2018   BILITOT 0.6 12/11/2018     Review of Systems  Constitutional: Negative.  Negative for chills, fatigue, fever, malaise/fatigue and unexpected weight change.  HENT: Negative for congestion, ear discharge, ear pain, rhinorrhea, sinus pressure, sneezing and sore throat.   Eyes: Positive for blurred vision. Negative for photophobia, pain, discharge, redness and itching.  Respiratory: Negative for cough, choking, shortness of breath, wheezing and stridor.   Cardiovascular: Negative for chest pain, palpitations, orthopnea and PND.  Gastrointestinal: Negative for abdominal pain, blood in stool, constipation, diarrhea, dysphagia, heartburn, nausea and vomiting.  Endocrine: Negative for cold intolerance, heat intolerance, polydipsia, polyphagia and polyuria.  Genitourinary: Negative for dysuria, flank pain, frequency, hematuria, menstrual problem, pelvic pain, urgency, vaginal bleeding and vaginal discharge.  Musculoskeletal: Negative for arthralgias, back pain, myalgias and neck pain.  Skin: Negative for rash.  Allergic/Immunologic: Negative for environmental allergies and food allergies.  Neurological: Positive for headaches. Negative for dizziness, weakness, light-headedness and numbness.  Hematological: Negative for adenopathy. Does not bruise/bleed easily.  Psychiatric/Behavioral: Negative for dysphoric mood. The patient is not nervous/anxious.     Patient Active Problem List   Diagnosis Date Noted  . Esophageal dysphagia 07/04/2019  . Familial hypercholesterolemia 07/04/2019  . Gastroesophageal reflux disease 07/04/2019  . Obesity (BMI 30.0-34.9) 03/13/2018  . Primary osteoarthritis of left knee 03/13/2018  . Hyperlipidemia, mixed 04/26/2017  . Chest pain 04/24/2017  . Essential hypertension 01/06/2016  Allergies  Allergen Reactions  . Lisinopril Other (See Comments)    Chest  pain, dizziness, cough  . Codeine Hives    "hair crawling" "hair crawling"  . Hydrochlorothiazide Anxiety    Jittery feeling Jittery feeling    Past Surgical History:  Procedure Laterality Date  . BACK SURGERY    . BREAST BIOPSY Bilateral    neg  . TUBAL LIGATION      Social History   Tobacco Use  . Smoking status: Never Smoker  . Smokeless tobacco: Never Used  Vaping Use  . Vaping Use: Never used  Substance Use Topics  . Alcohol use: No    Alcohol/week: 0.0 standard drinks  . Drug use: No     Medication list has been reviewed and updated.  Current Meds  Medication Sig  . Calcium Carbonate-Vit D-Min (CALCIUM 1200 PO) Take 1 capsule by mouth daily.  . Lactobacillus-Inulin (CULTURELLE DIGESTIVE HEALTH PO) Take 1 capsule by mouth daily.  . Multiple Vitamins-Minerals (ONE-A-DAY WOMENS 50 PLUS PO) Take 1 tablet by mouth daily.  . mupirocin ointment (BACTROBAN) 2 % Apply 1 application topically 2 (two) times daily.  . pantoprazole (PROTONIX) 40 MG tablet Take 1 tablet (40 mg total) by mouth daily.  . propranolol (INDERAL) 20 MG tablet Take 1 tablet (20 mg total) by mouth 2 (two) times daily.  Marland Kitchen VITAMIN D, CHOLECALCIFEROL, PO Take 1 capsule by mouth daily.    PHQ 2/9 Scores 01/27/2020 07/16/2019 07/04/2019 04/04/2019  PHQ - 2 Score 0 0 0 4  PHQ- 9 Score 0 - 2 16    GAD 7 : Generalized Anxiety Score 01/27/2020 07/04/2019 04/04/2019  Nervous, Anxious, on Edge 0 0 2  Control/stop worrying 0 2 0  Worry too much - different things 0 2 0  Trouble relaxing 0 0 3  Restless 0 0 3  Easily annoyed or irritable 0 0 3  Afraid - awful might happen 0 0 0  Total GAD 7 Score 0 4 11  Anxiety Difficulty - Not difficult at all Somewhat difficult    BP Readings from Last 3 Encounters:  01/27/20 (!) 160/98  07/07/19 120/68  07/04/19 120/76    Physical Exam Vitals and nursing note reviewed.  Constitutional:      General: She is not in acute distress.    Appearance: She is not  diaphoretic.  HENT:     Head: Normocephalic and atraumatic.     Right Ear: Tympanic membrane, ear canal and external ear normal.     Left Ear: Tympanic membrane, ear canal and external ear normal.     Nose: Nose normal. No congestion or rhinorrhea.     Mouth/Throat:     Mouth: Mucous membranes are moist.  Eyes:     General:        Right eye: No discharge.        Left eye: No discharge.     Conjunctiva/sclera: Conjunctivae normal.     Pupils: Pupils are equal, round, and reactive to light.  Neck:     Thyroid: No thyromegaly.     Vascular: No carotid bruit or JVD.  Cardiovascular:     Rate and Rhythm: Normal rate and regular rhythm.     Heart sounds: Normal heart sounds. No murmur heard.  No friction rub. No gallop.   Pulmonary:     Effort: Pulmonary effort is normal. No respiratory distress.     Breath sounds: Normal breath sounds. No wheezing, rhonchi or rales.  Abdominal:  General: Bowel sounds are normal.     Palpations: Abdomen is soft. There is no mass.     Tenderness: There is no abdominal tenderness. There is no guarding.  Musculoskeletal:        General: Normal range of motion.     Cervical back: Normal range of motion and neck supple.  Lymphadenopathy:     Cervical: No cervical adenopathy.  Skin:    General: Skin is warm and dry.  Neurological:     Mental Status: She is alert.     Cranial Nerves: No cranial nerve deficit.     Sensory: No sensory deficit.     Motor: No weakness.     Deep Tendon Reflexes: Reflexes are normal and symmetric.     Wt Readings from Last 3 Encounters:  01/27/20 212 lb (96.2 kg)  07/16/19 201 lb 12.8 oz (91.5 kg)  07/07/19 207 lb (93.9 kg)    BP (!) 160/98   Pulse 68   Ht 5\' 5"  (1.651 m)   Wt 212 lb (96.2 kg)   BMI 35.28 kg/m   Assessment and Plan: 1. Essential hypertension Chronic.  Uncontrolled.  Stable.  Patient's been having increasing elevations of blood pressure but has had issues with multiple medications in the  past.  We will continue propranolol at current dosing of 20 mg twice a day but we will add amlodipine 2.5 mg 1 twice a day as well we will recheck blood pressure in 3 months.  We will hold on renal panel until that time. - amLODipine (NORVASC) 2.5 MG tablet; Take 1 tablet (2.5 mg total) by mouth daily.  Dispense: 60 tablet; Refill: 3 - propranolol (INDERAL) 20 MG tablet; Take 1 tablet (20 mg total) by mouth 2 (two) times daily.  Dispense: 180 tablet; Refill: 1  2. Gastroesophageal reflux disease, unspecified whether esophagitis present Chronic.  Controlled.  Stable.  Continue pantoprazole 40 mg once a day. - pantoprazole (PROTONIX) 40 MG tablet; Take 1 tablet (40 mg total) by mouth daily.  Dispense: 90 tablet; Refill: 1  3. Esophageal dysphagia Currently not having any significant dysphagia.  As above continue pantoprazole 40 mg once a day.

## 2020-02-06 DIAGNOSIS — R69 Illness, unspecified: Secondary | ICD-10-CM | POA: Diagnosis not present

## 2020-02-09 DIAGNOSIS — Z20822 Contact with and (suspected) exposure to covid-19: Secondary | ICD-10-CM | POA: Diagnosis not present

## 2020-02-23 ENCOUNTER — Encounter: Payer: Self-pay | Admitting: Family Medicine

## 2020-02-24 ENCOUNTER — Other Ambulatory Visit: Payer: Self-pay

## 2020-02-24 ENCOUNTER — Encounter: Payer: Self-pay | Admitting: Family Medicine

## 2020-02-25 ENCOUNTER — Encounter: Payer: Self-pay | Admitting: Family Medicine

## 2020-03-10 DIAGNOSIS — M1712 Unilateral primary osteoarthritis, left knee: Secondary | ICD-10-CM | POA: Diagnosis not present

## 2020-03-15 DIAGNOSIS — M1712 Unilateral primary osteoarthritis, left knee: Secondary | ICD-10-CM | POA: Diagnosis not present

## 2020-03-26 ENCOUNTER — Other Ambulatory Visit: Payer: Self-pay | Admitting: Surgery

## 2020-04-02 ENCOUNTER — Ambulatory Visit (INDEPENDENT_AMBULATORY_CARE_PROVIDER_SITE_OTHER): Payer: Medicare HMO | Admitting: Family Medicine

## 2020-04-02 ENCOUNTER — Other Ambulatory Visit: Payer: Self-pay

## 2020-04-02 ENCOUNTER — Encounter: Payer: Self-pay | Admitting: Family Medicine

## 2020-04-02 ENCOUNTER — Encounter
Admission: RE | Admit: 2020-04-02 | Discharge: 2020-04-02 | Disposition: A | Discharge status: Home / Self Care | Payer: Medicare HMO | Source: Ambulatory Visit | Attending: Surgery | Admitting: Surgery

## 2020-04-02 VITALS — BP 140/90 | HR 64 | Ht 65.0 in | Wt 204.0 lb

## 2020-04-02 DIAGNOSIS — H6983 Other specified disorders of Eustachian tube, bilateral: Secondary | ICD-10-CM | POA: Diagnosis not present

## 2020-04-02 DIAGNOSIS — E860 Dehydration: Secondary | ICD-10-CM | POA: Diagnosis not present

## 2020-04-02 DIAGNOSIS — Z01818 Encounter for other preprocedural examination: Secondary | ICD-10-CM | POA: Diagnosis not present

## 2020-04-02 DIAGNOSIS — R55 Syncope and collapse: Secondary | ICD-10-CM

## 2020-04-02 DIAGNOSIS — R350 Frequency of micturition: Secondary | ICD-10-CM

## 2020-04-02 DIAGNOSIS — H8113 Benign paroxysmal vertigo, bilateral: Secondary | ICD-10-CM | POA: Diagnosis not present

## 2020-04-02 HISTORY — DX: Unspecified osteoarthritis, unspecified site: M19.90

## 2020-04-02 LAB — COMPREHENSIVE METABOLIC PANEL
ALT: 13 U/L (ref 0–44)
AST: 18 U/L (ref 15–41)
Albumin: 4.2 g/dL (ref 3.5–5.0)
Alkaline Phosphatase: 62 U/L (ref 38–126)
Anion gap: 11 (ref 5–15)
BUN: 19 mg/dL (ref 8–23)
CO2: 28 mmol/L (ref 22–32)
Calcium: 9.6 mg/dL (ref 8.9–10.3)
Chloride: 104 mmol/L (ref 98–111)
Creatinine, Ser: 0.92 mg/dL (ref 0.44–1.00)
GFR, Estimated: >60 mL/min (ref >60)
Glucose, Bld: 97 mg/dL (ref 70–99)
Potassium: 3.6 mmol/L (ref 3.5–5.1)
Sodium: 143 mmol/L (ref 135–145)
Total Bilirubin: 0.7 mg/dL (ref 0.3–1.2)
Total Protein: 6.9 g/dL (ref 6.5–8.1)

## 2020-04-02 LAB — POCT URINALYSIS DIPSTICK
Bilirubin, UA: NEGATIVE
Blood, UA: NEGATIVE
Glucose, UA: NEGATIVE
Ketones, UA: NEGATIVE
Leukocytes, UA: NEGATIVE
Nitrite, UA: NEGATIVE
Protein, UA: NEGATIVE
Spec Grav, UA: 1.02 (ref 1.010–1.025)
Urobilinogen, UA: 0.2 E.U./dL
pH, UA: 7 (ref 5.0–8.0)

## 2020-04-02 LAB — CBC WITH DIFFERENTIAL/PLATELET
Abs Immature Granulocytes: 0.01 10*3/uL (ref 0.00–0.07)
Basophils Absolute: 0 10*3/uL (ref 0.0–0.1)
Basophils Relative: 1 %
Eosinophils Absolute: 0.1 10*3/uL (ref 0.0–0.5)
Eosinophils Relative: 1 %
HCT: 35.8 % — ABNORMAL LOW (ref 36.0–46.0)
Hemoglobin: 12 g/dL (ref 12.0–15.0)
Immature Granulocytes: 0 %
Lymphocytes Relative: 38 %
Lymphs Abs: 2.4 10*3/uL (ref 0.7–4.0)
MCH: 30 pg (ref 26.0–34.0)
MCHC: 33.5 g/dL (ref 30.0–36.0)
MCV: 89.5 fL (ref 80.0–100.0)
Monocytes Absolute: 0.4 10*3/uL (ref 0.1–1.0)
Monocytes Relative: 6 %
Neutro Abs: 3.5 10*3/uL (ref 1.7–7.7)
Neutrophils Relative %: 54 %
Platelets: 294 10*3/uL (ref 150–400)
RBC: 4 MIL/uL (ref 3.87–5.11)
RDW: 12.9 % (ref 11.5–15.5)
WBC: 6.5 10*3/uL (ref 4.0–10.5)
nRBC: 0 % (ref 0.0–0.2)

## 2020-04-02 LAB — TYPE AND SCREEN
ABO/RH(D): O NEG
Antibody Screen: NEGATIVE

## 2020-04-02 LAB — SURGICAL PCR SCREEN
MRSA, PCR: NEGATIVE
Staphylococcus aureus: NEGATIVE

## 2020-04-02 NOTE — Progress Notes (Signed)
Date:  04/02/2020   Name:  Lauren Leblanc   DOB:  1950-09-18   MRN:  [id number]   Chief Complaint: Hypertension (B/p check due to a near syncope episode this am. Pt did have frequent urination during the night)  Hypertension This is a chronic problem. The current episode started more than 1 year ago. The problem has been waxing and waning since onset. The problem is uncontrolled. Pertinent negatives include no anxiety, blurred vision, chest pain, headaches, malaise/fatigue, neck pain, orthopnea, palpitations, peripheral edema, PND, shortness of breath or sweats. There are no associated agents to hypertension. Past treatments include beta blockers and calcium channel blockers. The current treatment provides mild improvement. There are no compliance problems.  There is no history of angina, kidney disease, CAD/MI, CVA, heart failure, left ventricular hypertrophy, PVD or retinopathy. There is no history of chronic renal disease, a hypertension causing med or renovascular disease.  Dizziness This is a new problem. The current episode started today. Associated symptoms include nausea and vertigo. Pertinent negatives include no abdominal pain, anorexia, arthralgias, change in bowel habit, chest pain, chills, congestion, coughing, fatigue, fever, headaches, joint swelling, myalgias, neck pain, numbness, rash, sore throat, swollen glands, urinary symptoms, visual change, vomiting or weakness. She has tried nothing for the symptoms. The treatment provided moderate relief.    Lab Results  Component Value Date   CREATININE 0.95 07/04/2019   BUN 15 07/04/2019   NA 145 (H) 07/04/2019   K 4.6 07/04/2019   CL 107 (H) 07/04/2019   CO2 24 07/04/2019   Lab Results  Component Value Date   CHOL 223 (H) 07/04/2019   HDL 51 07/04/2019   LDLCALC 154 (H) 07/04/2019   TRIG 101 07/04/2019   CHOLHDL 5.3 04/25/2017   Lab Results  Component Value Date   TSH 1.359 12/11/2018   No results found for:  HGBA1C Lab Results  Component Value Date   WBC 9.7 12/11/2018   HGB 12.7 12/11/2018   HCT 37.3 12/11/2018   MCV 86.7 12/11/2018   PLT 269 12/11/2018   Lab Results  Component Value Date   ALT 12 12/11/2018   AST 20 12/11/2018   ALKPHOS 63 12/11/2018   BILITOT 0.6 12/11/2018     Review of Systems  Constitutional: Negative.  Negative for chills, fatigue, fever, malaise/fatigue and unexpected weight change.  HENT: Negative for congestion, ear discharge, ear pain, rhinorrhea, sinus pressure, sneezing and sore throat.   Eyes: Negative for blurred vision, double vision, photophobia, pain, discharge, redness and itching.  Respiratory: Negative for cough, shortness of breath, wheezing and stridor.   Cardiovascular: Negative for chest pain, palpitations, orthopnea and PND.  Gastrointestinal: Positive for nausea. Negative for abdominal pain, anorexia, blood in stool, change in bowel habit, constipation, diarrhea and vomiting.  Endocrine: Negative for cold intolerance, heat intolerance, polydipsia, polyphagia and polyuria.  Genitourinary: Negative for dysuria, flank pain, frequency, hematuria, menstrual problem, pelvic pain, urgency, vaginal bleeding and vaginal discharge.  Musculoskeletal: Negative for arthralgias, back pain, joint swelling, myalgias and neck pain.  Skin: Negative for rash.  Allergic/Immunologic: Negative for environmental allergies and food allergies.  Neurological: Positive for dizziness and vertigo. Negative for weakness, light-headedness, numbness and headaches.  Hematological: Negative for adenopathy. Does not bruise/bleed easily.  Psychiatric/Behavioral: Negative for dysphoric mood. The patient is not nervous/anxious.     Patient Active Problem List   Diagnosis Date Noted  . Esophageal dysphagia 07/04/2019  . Familial hypercholesterolemia 07/04/2019  . Gastroesophageal reflux disease 07/04/2019  .  Obesity (BMI 30.0-34.9) 03/13/2018  . Primary osteoarthritis of  left knee 03/13/2018  . Hyperlipidemia, mixed 04/26/2017  . Chest pain 04/24/2017  . Essential hypertension 01/06/2016    Allergies  Allergen Reactions  . Lisinopril Other (See Comments)    Chest pain, dizziness, cough  . Codeine Hives    "hair crawling"   . Hydrochlorothiazide Anxiety    Jittery feeling     Past Surgical History:  Procedure Laterality Date  . BACK SURGERY    . BREAST BIOPSY Bilateral    neg  . TUBAL LIGATION      Social History   Tobacco Use  . Smoking status: Never Smoker  . Smokeless tobacco: Never Used  Vaping Use  . Vaping Use: Never used  Substance Use Topics  . Alcohol use: No    Alcohol/week: 0.0 standard drinks  . Drug use: No     Medication list has been reviewed and updated.  Current Meds  Medication Sig  . amLODipine (NORVASC) 2.5 MG tablet Take 2.5 mg by mouth daily.  . Calcium Carbonate-Vit D-Min (CALCIUM 1200 PO) Take 1 tablet by mouth daily.   . Lactobacillus-Inulin (CULTURELLE DIGESTIVE HEALTH PO) Take 1 capsule by mouth daily.  . Multiple Vitamins-Minerals (ONE-A-DAY WOMENS 50 PLUS PO) Take 1 tablet by mouth daily.  Marland Kitchen OVER THE COUNTER MEDICATION Take 2 each by mouth daily. Neuriva  . pantoprazole (PROTONIX) 40 MG tablet Take 1 tablet (40 mg total) by mouth daily.  . propranolol (INDERAL) 20 MG tablet Take 1 tablet (20 mg total) by mouth 2 (two) times daily.  . Vitamin D, Cholecalciferol, 25 MCG (1000 UT) TABS Take 1,000 Units by mouth daily.     PHQ 2/9 Scores 01/27/2020 07/16/2019 07/04/2019 04/04/2019  PHQ - 2 Score 0 0 0 4  PHQ- 9 Score 0 - 2 16    GAD 7 : Generalized Anxiety Score 01/27/2020 07/04/2019 04/04/2019  Nervous, Anxious, on Edge 0 0 2  Control/stop worrying 0 2 0  Worry too much - different things 0 2 0  Trouble relaxing 0 0 3  Restless 0 0 3  Easily annoyed or irritable 0 0 3  Afraid - awful might happen 0 0 0  Total GAD 7 Score 0 4 11  Anxiety Difficulty - Not difficult at all Somewhat difficult     BP Readings from Last 3 Encounters:  04/02/20 (!) 140/100  01/27/20 (!) 160/98  07/07/19 120/68    Physical Exam Vitals and nursing note reviewed.  Constitutional:      Appearance: She is well-developed and well-nourished.  HENT:     Head: Normocephalic.     Right Ear: External ear normal. Tympanic membrane is retracted.     Left Ear: External ear normal. Tympanic membrane is retracted.     Mouth/Throat:     Mouth: Oropharynx is clear and moist.  Eyes:     General: Lids are everted, no foreign bodies appreciated. No scleral icterus.       Left eye: No foreign body or hordeolum.     Extraocular Movements: EOM normal.     Conjunctiva/sclera: Conjunctivae normal.     Right eye: Right conjunctiva is not injected.     Left eye: Left conjunctiva is not injected.     Pupils: Pupils are equal, round, and reactive to light.  Neck:     Thyroid: No thyromegaly.     Vascular: No JVD.     Trachea: No tracheal deviation.  Cardiovascular:  Rate and Rhythm: Normal rate and regular rhythm.     Pulses: Intact distal pulses.     Heart sounds: Normal heart sounds. No murmur heard. No friction rub. No gallop.   Pulmonary:     Effort: Pulmonary effort is normal. No respiratory distress.     Breath sounds: Normal breath sounds. No wheezing or rales.  Abdominal:     General: Bowel sounds are normal.     Palpations: Abdomen is soft. There is no hepatosplenomegaly or mass.     Tenderness: There is no abdominal tenderness. There is no guarding or rebound.  Musculoskeletal:        General: No tenderness or edema. Normal range of motion.     Cervical back: Normal range of motion and neck supple.  Lymphadenopathy:     Cervical: No cervical adenopathy.  Skin:    General: Skin is warm.     Findings: No rash.  Neurological:     Mental Status: She is alert and oriented to person, place, and time.     Cranial Nerves: No cranial nerve deficit.     Deep Tendon Reflexes: Strength normal.  Reflexes normal.  Psychiatric:        Mood and Affect: Mood and affect normal. Mood is not anxious or depressed.     Wt Readings from Last 3 Encounters:  04/02/20 204 lb (92.5 kg)  01/27/20 212 lb (96.2 kg)  07/16/19 201 lb 12.8 oz (91.5 kg)    BP (!) 140/100   Pulse 64   Ht 5\' 5"  (1.651 m)   Wt 204 lb (92.5 kg)   BMI 33.95 kg/m   Assessment and Plan: 1. Near syncope Patient seen as a walk-in today with episode of a near syncopal episode. Patient had chest gotten out of the shower which was hot and was drying her hair and she felt sudden vertigo with nausea and saw stars but she did not pass out. This appears to be consistent with a vertigo episode but we will check other issues.  2. Eustachian tube dysfunction, bilateral Exam noted that patient had eustachian tube dysfunction with bilateral retracted tympanic membranes the right greater than the left.  3. Dehydration Specific gravity of the urine was elevated to 1020. There was no evidence of infection. Mucous membranes were decreased moisture this would suggest that patient had mild dehydration and fluids have been encouraged  4. Benign paroxysmal positional vertigo due to bilateral vestibular disorder New onset. Patient does have a history of motion sickness that she cannot ride in the backseat of her car. Probably had a episode of benign positional vertigo secondary to her recent frontal headache come through. We will hold on taking any medication that can make her dizzy nor Sudafed to relieve her eustachian tube dysfunction and this should resolve on its own.  5. Frequent urination Patient had frequent urination for which we did a fingerstick glucose screen and it was noted to be 104 mg percent. Urinalysis was unremarkable for any infection. - POCT Urinalysis Dipstick

## 2020-04-02 NOTE — Patient Instructions (Addendum)
Your procedure is scheduled on:04-13-20 TUESDAY Report to the Registration Desk on the 1st floor of the [facility]-Then proceed to the 2nd floor Surgery Desk in the Medical [facility] To find out your arrival time, please call [phone] between 1PM - 3PM on:04-12-20 MONDAY  REMEMBER: Instructions that are not followed completely may result in serious medical risk, up to and including death; or upon the discretion of your surgeon and anesthesiologist your surgery may need to be rescheduled.  Do not eat food after midnight the night before surgery.  No gum chewing, lozengers or hard candies.  You may however, drink CLEAR liquids up to 2 hours before you are scheduled to arrive for your surgery. Do not drink anything within 2 hours of your scheduled arrival time.  Clear liquids include: - water  - apple juice without pulp - gatorade  - black coffee or tea (Do NOT add milk or creamers to the coffee or tea) Do NOT drink anything that is not on this list.  In addition, your doctor has ordered for you to drink the provided  Ensure Pre-Surgery Clear Carbohydrate Drink  Drinking this carbohydrate drink up to two hours before surgery helps to reduce insulin resistance and improve patient outcomes. Please complete drinking 2 hours prior to scheduled arrival time.  TAKE THESE MEDICATIONS THE MORNING OF SURGERY WITH A SIP OF WATER: -NORVASC (AMLODIPINE) -PROPRANOLOL (INDERAL) -PROTONIX (PANTOPRAZOLE)-take one the night before and one on the morning of surgery - helps to prevent nausea after surgery.)  One week prior to surgery: Stop Anti-inflammatories (NSAIDS) such as Advil, Aleve, Ibuprofen, Motrin, Naproxen, Naprosyn and Aspirin based products such as Excedrin, Goodys Powder, BC Powder-OK TO TAKE TYLENOL IF NEEDED   Stop ANY OVER THE COUNTER supplements until after surgery-STOP YOUR NEURIVA 7 DAYS PRIOR TO SURGERY-YOU MAY RESUME AFTER YOUR SURGERY (However, you may continue taking  Calcium-Vitamin D, Culturelle Digestive and multivitamin up until the day before surgery.)  No Alcohol for 24 hours before or after surgery.  No Smoking including e-cigarettes for 24 hours prior to surgery.  No chewable tobacco products for at least 6 hours prior to surgery.  No nicotine patches on the day of surgery.  Do not use any "recreational" drugs for at least a week prior to your surgery.  Please be advised that the combination of cocaine and anesthesia may have negative outcomes, up to and including death. If you test positive for cocaine, your surgery will be cancelled.  On the morning of surgery brush your teeth with toothpaste and water, you may rinse your mouth with mouthwash if you wish. Do not swallow any toothpaste or mouthwash.  Do not wear jewelry, make-up, hairpins, clips or nail polish.  Do not wear lotions, powders, or perfumes.   Do not shave body from the neck down 48 hours prior to surgery just in case you cut yourself which could leave a site for infection.  Also, freshly shaved skin may become irritated if using the CHG soap.  Contact lenses, hearing aids and dentures may not be worn into surgery.  Do not bring valuables to the hospital. [facility] is not responsible for any missing/lost belongings or valuables.   Use CHG Soap as directed on instruction sheet.  Notify your doctor if there is any change in your medical condition (cold, fever, infection).  Wear comfortable clothing (specific to your surgery type) to the hospital.  Plan for stool softeners for home use; pain medications have a tendency to cause constipation. You  can also help prevent constipation by eating foods high in fiber such as fruits and vegetables and drinking plenty of fluids as your diet allows.  After surgery, you can help prevent lung complications by doing breathing exercises.  Take deep breaths and cough every 1-2 hours. Your doctor may order a device called an Incentive  Spirometer to help you take deep breaths. When coughing or sneezing, hold a pillow firmly against your incision with both hands. This is called "splinting." Doing this helps protect your incision. It also decreases belly discomfort.  If you are being admitted to the hospital overnight, leave your suitcase in the car. After surgery it may be brought to your room.  If you are being discharged the day of surgery, you will not be allowed to drive home. You will need a responsible adult (18 years or older) to drive you home and stay with you that night.   If you are taking public transportation, you will need to have a responsible adult (18 years or older) with you. Please confirm with your physician that it is acceptable to use public transportation.   Please call the Pre-admissions Testing Dept. at [phone] if you have any questions about these instructions.  Visitation Policy:  Patients undergoing a surgery or procedure may have one family member or support person with them as long as that person is not COVID-19 positive or experiencing its symptoms.  That person may remain in the waiting area during the procedure.  Inpatient Visitation Update:   In an effort to ensure the safety of our team members and our patients, we are implementing a change to our visitation policy:  Effective Monday, Aug. 9, at 7 a.m., inpatients will be allowed one support person.  o The support person may change daily.  o The support person must pass our screening, gel in and out, and wear a mask at all times, including in the patient's room.  o Patients must also wear a mask when staff or their support person are in the room.  o Masking is required regardless of vaccination status.  Systemwide, no visitors 17 or younger.

## 2020-04-09 ENCOUNTER — Other Ambulatory Visit: Payer: Self-pay

## 2020-04-09 ENCOUNTER — Other Ambulatory Visit
Admission: RE | Admit: 2020-04-09 | Discharge: 2020-04-09 | Disposition: A | Discharge status: Home / Self Care | Payer: Medicare HMO | Source: Ambulatory Visit | Attending: Surgery | Admitting: Surgery

## 2020-04-09 DIAGNOSIS — Z20822 Contact with and (suspected) exposure to covid-19: Secondary | ICD-10-CM | POA: Diagnosis not present

## 2020-04-09 DIAGNOSIS — Z01812 Encounter for preprocedural laboratory examination: Secondary | ICD-10-CM | POA: Insufficient documentation

## 2020-04-09 LAB — SARS CORONAVIRUS 2 (TAT 6-24 HRS): SARS Coronavirus 2: NEGATIVE

## 2020-04-13 ENCOUNTER — Observation Stay
Admission: RE | Admit: 2020-04-13 | Discharge: 2020-04-15 | Disposition: A | Discharge status: Home / Self Care | Payer: Medicare HMO | Attending: Surgery | Admitting: Surgery

## 2020-04-13 ENCOUNTER — Ambulatory Visit: Payer: Medicare HMO

## 2020-04-13 ENCOUNTER — Encounter
Admission: RE | Disposition: A | Discharge status: Home / Self Care | Payer: Self-pay | Source: Home / Self Care | Attending: Surgery

## 2020-04-13 ENCOUNTER — Ambulatory Visit: Payer: Medicare HMO | Admitting: Registered Nurse

## 2020-04-13 ENCOUNTER — Other Ambulatory Visit: Payer: Self-pay

## 2020-04-13 ENCOUNTER — Encounter: Payer: Self-pay | Admitting: Surgery

## 2020-04-13 DIAGNOSIS — Z96642 Presence of left artificial hip joint: Secondary | ICD-10-CM | POA: Diagnosis not present

## 2020-04-13 DIAGNOSIS — M1712 Unilateral primary osteoarthritis, left knee: Principal | ICD-10-CM | POA: Insufficient documentation

## 2020-04-13 DIAGNOSIS — Z79899 Other long term (current) drug therapy: Secondary | ICD-10-CM | POA: Insufficient documentation

## 2020-04-13 DIAGNOSIS — I10 Essential (primary) hypertension: Secondary | ICD-10-CM | POA: Insufficient documentation

## 2020-04-13 DIAGNOSIS — Z471 Aftercare following joint replacement surgery: Secondary | ICD-10-CM | POA: Diagnosis not present

## 2020-04-13 DIAGNOSIS — Z96652 Presence of left artificial knee joint: Secondary | ICD-10-CM | POA: Diagnosis not present

## 2020-04-13 DIAGNOSIS — M25562 Pain in left knee: Secondary | ICD-10-CM | POA: Diagnosis present

## 2020-04-13 HISTORY — PX: TOTAL KNEE ARTHROPLASTY: SHX125

## 2020-04-13 SURGERY — ARTHROPLASTY, KNEE, TOTAL
Anesthesia: Spinal | Site: Knee | Laterality: Left

## 2020-04-13 MED ORDER — ONDANSETRON HCL 4 MG/2ML IJ SOLN: 4.0000 mg | Freq: Once | INTRAMUSCULAR | Status: DC | PRN

## 2020-04-13 MED ORDER — DEXMEDETOMIDINE (PRECEDEX) IN NS 20 MCG/5ML (4 MCG/ML) IV SYRINGE
PREFILLED_SYRINGE | INTRAVENOUS | Status: AC
  Filled 2020-04-13: qty 5

## 2020-04-13 MED ORDER — DEXMEDETOMIDINE HCL 200 MCG/2ML IV SOLN
INTRAVENOUS | Status: DC | PRN
  Administered 2020-04-13: 8 ug via INTRAVENOUS
  Administered 2020-04-13: 12 ug via INTRAVENOUS

## 2020-04-13 MED ORDER — FENTANYL CITRATE (PF) 100 MCG/2ML IJ SOLN
25.0000 ug | INTRAMUSCULAR | Status: DC | PRN
  Administered 2020-04-13: 25 ug via INTRAVENOUS

## 2020-04-13 MED ORDER — PROPRANOLOL HCL 20 MG PO TABS
20.0000 mg | ORAL_TABLET | Freq: Two times a day (BID) | ORAL | Status: DC
  Administered 2020-04-13 – 2020-04-15 (×4): 20 mg via ORAL
  Filled 2020-04-13 (×5): qty 1

## 2020-04-13 MED ORDER — BISACODYL 10 MG RE SUPP
10.0000 mg | Freq: Every day | RECTAL | Status: DC | PRN
  Filled 2020-04-13: qty 1

## 2020-04-13 MED ORDER — ONDANSETRON HCL 4 MG/2ML IJ SOLN
INTRAMUSCULAR | Status: AC
  Filled 2020-04-13: qty 2

## 2020-04-13 MED ORDER — CALCIUM CARBONATE-VITAMIN D 500-200 MG-UNIT PO TABS
1.0000 | ORAL_TABLET | Freq: Every day | ORAL | Status: DC
  Administered 2020-04-14: 1 via ORAL
  Filled 2020-04-13 (×2): qty 1

## 2020-04-13 MED ORDER — SODIUM CHLORIDE FLUSH 0.9 % IV SOLN
INTRAVENOUS | Status: AC
  Filled 2020-04-13: qty 40

## 2020-04-13 MED ORDER — KETOROLAC TROMETHAMINE 15 MG/ML IJ SOLN
15.0000 mg | Freq: Four times a day (QID) | INTRAMUSCULAR | Status: AC
  Administered 2020-04-14: 15 mg via INTRAVENOUS

## 2020-04-13 MED ORDER — BUPIVACAINE HCL (PF) 0.5 % IJ SOLN
INTRAMUSCULAR | Status: DC | PRN
  Administered 2020-04-13: 2.75 mL

## 2020-04-13 MED ORDER — BUPIVACAINE LIPOSOME 1.3 % IJ SUSP
INTRAMUSCULAR | Status: AC
  Filled 2020-04-13: qty 20

## 2020-04-13 MED ORDER — EPHEDRINE 5 MG/ML INJ
INTRAVENOUS | Status: AC
  Filled 2020-04-13: qty 10

## 2020-04-13 MED ORDER — DOCUSATE SODIUM 100 MG PO CAPS
100.0000 mg | ORAL_CAPSULE | Freq: Two times a day (BID) | ORAL | Status: DC
  Administered 2020-04-13 – 2020-04-15 (×4): 100 mg via ORAL
  Filled 2020-04-13 (×6): qty 1

## 2020-04-13 MED ORDER — EPHEDRINE SULFATE 50 MG/ML IJ SOLN
INTRAMUSCULAR | Status: DC | PRN
  Administered 2020-04-13: 5 mg via INTRAVENOUS
  Administered 2020-04-13: 10 mg via INTRAVENOUS

## 2020-04-13 MED ORDER — OXYCODONE HCL 5 MG PO TABS
5.0000 mg | ORAL_TABLET | ORAL | Status: DC | PRN
  Administered 2020-04-13 – 2020-04-15 (×5): 10 mg via ORAL

## 2020-04-13 MED ORDER — HYDROMORPHONE HCL 1 MG/ML IJ SOLN: 0.5000 mg | Freq: Once | INTRAMUSCULAR | Status: DC

## 2020-04-13 MED ORDER — ADULT MULTIVITAMIN W/MINERALS CH
1.0000 | ORAL_TABLET | Freq: Every day | ORAL | Status: DC
  Administered 2020-04-14: 1 via ORAL
  Filled 2020-04-13 (×2): qty 1

## 2020-04-13 MED ORDER — HYDROMORPHONE HCL 1 MG/ML IJ SOLN
INTRAMUSCULAR | Status: AC
  Filled 2020-04-13: qty 0.5

## 2020-04-13 MED ORDER — ORAL CARE MOUTH RINSE: 15.0000 mL | Freq: Once | OROMUCOSAL | Status: AC

## 2020-04-13 MED ORDER — ACETAMINOPHEN 500 MG PO TABS
ORAL_TABLET | ORAL | Status: AC
  Administered 2020-04-13: 1000 mg via ORAL
  Filled 2020-04-13: qty 2

## 2020-04-13 MED ORDER — ACETAMINOPHEN 10 MG/ML IV SOLN
INTRAVENOUS | Status: DC | PRN
  Administered 2020-04-13: 1000 mg via INTRAVENOUS

## 2020-04-13 MED ORDER — ENOXAPARIN SODIUM 40 MG/0.4ML ~~LOC~~ SOLN: 40.0000 mg | SUBCUTANEOUS | Status: DC

## 2020-04-13 MED ORDER — TRAMADOL HCL 50 MG PO TABS: 50.0000 mg | ORAL_TABLET | Freq: Four times a day (QID) | ORAL | Status: DC | PRN

## 2020-04-13 MED ORDER — FLEET ENEMA 7-19 GM/118ML RE ENEM: 1.0000 | ENEMA | Freq: Once | RECTAL | Status: DC | PRN

## 2020-04-13 MED ORDER — CEFAZOLIN SODIUM-DEXTROSE 2-4 GM/100ML-% IV SOLN
INTRAVENOUS | Status: AC
  Administered 2020-04-13: 2 g via INTRAVENOUS
  Filled 2020-04-13: qty 100

## 2020-04-13 MED ORDER — CHLORHEXIDINE GLUCONATE 0.12 % MT SOLN
OROMUCOSAL | Status: AC
  Administered 2020-04-13: 15 mL via OROMUCOSAL
  Filled 2020-04-13: qty 15

## 2020-04-13 MED ORDER — HYDROMORPHONE HCL 1 MG/ML IJ SOLN
0.2500 mg | INTRAMUSCULAR | Status: DC | PRN
  Administered 2020-04-13: 0.5 mg via INTRAVENOUS

## 2020-04-13 MED ORDER — OXYCODONE HCL 5 MG PO TABS
ORAL_TABLET | ORAL | Status: AC
  Filled 2020-04-13: qty 2

## 2020-04-13 MED ORDER — CHLORHEXIDINE GLUCONATE 0.12 % MT SOLN: 15.0000 mL | Freq: Once | OROMUCOSAL | Status: AC

## 2020-04-13 MED ORDER — ACETAMINOPHEN 500 MG PO TABS
1000.0000 mg | ORAL_TABLET | Freq: Four times a day (QID) | ORAL | Status: AC
  Administered 2020-04-13: 1000 mg via ORAL

## 2020-04-13 MED ORDER — FENTANYL CITRATE (PF) 100 MCG/2ML IJ SOLN
INTRAMUSCULAR | Status: AC
  Administered 2020-04-13: 50 ug via INTRAVENOUS
  Filled 2020-04-13: qty 2

## 2020-04-13 MED ORDER — HYDROMORPHONE HCL 1 MG/ML IJ SOLN
INTRAMUSCULAR | Status: AC
  Administered 2020-04-13: 0.5 mg via INTRAVENOUS
  Filled 2020-04-13: qty 0.5

## 2020-04-13 MED ORDER — BUPIVACAINE-EPINEPHRINE (PF) 0.5% -1:200000 IJ SOLN
INTRAMUSCULAR | Status: AC
  Filled 2020-04-13: qty 30

## 2020-04-13 MED ORDER — BUPIVACAINE-EPINEPHRINE (PF) 0.5% -1:200000 IJ SOLN
INTRAMUSCULAR | Status: DC | PRN
  Administered 2020-04-13: 30 mL

## 2020-04-13 MED ORDER — ONDANSETRON HCL 4 MG/2ML IJ SOLN
4.0000 mg | Freq: Four times a day (QID) | INTRAMUSCULAR | Status: DC | PRN
  Administered 2020-04-13 – 2020-04-14 (×2): 4 mg via INTRAVENOUS

## 2020-04-13 MED ORDER — OXYCODONE HCL 5 MG PO TABS
ORAL_TABLET | ORAL | Status: AC
  Administered 2020-04-13: 10 mg via ORAL
  Filled 2020-04-13: qty 2

## 2020-04-13 MED ORDER — ACETAMINOPHEN 325 MG PO TABS
325.0000 mg | ORAL_TABLET | Freq: Four times a day (QID) | ORAL | Status: DC | PRN
  Administered 2020-04-15: 650 mg via ORAL

## 2020-04-13 MED ORDER — ESMOLOL HCL 100 MG/10ML IV SOLN
INTRAVENOUS | Status: AC
  Filled 2020-04-13: qty 10

## 2020-04-13 MED ORDER — TRANEXAMIC ACID 1000 MG/10ML IV SOLN
INTRAVENOUS | Status: DC | PRN
  Administered 2020-04-13: 1000 mg via TOPICAL

## 2020-04-13 MED ORDER — VITAMIN D3 25 MCG (1000 UNIT) PO TABS
1000.0000 [IU] | ORAL_TABLET | Freq: Every day | ORAL | Status: DC
  Administered 2020-04-14: 1000 [IU] via ORAL
  Filled 2020-04-13 (×3): qty 1

## 2020-04-13 MED ORDER — MAGNESIUM HYDROXIDE 400 MG/5ML PO SUSP: 30.0000 mL | Freq: Every day | ORAL | Status: DC | PRN

## 2020-04-13 MED ORDER — KETOROLAC TROMETHAMINE 30 MG/ML IJ SOLN
30.0000 mg | Freq: Once | INTRAMUSCULAR | Status: AC
  Administered 2020-04-13: 30 mg via INTRAVENOUS

## 2020-04-13 MED ORDER — SODIUM CHLORIDE 0.9 % IV SOLN: INTRAVENOUS | Status: DC

## 2020-04-13 MED ORDER — TRANEXAMIC ACID 1000 MG/10ML IV SOLN
INTRAVENOUS | Status: AC
  Filled 2020-04-13: qty 10

## 2020-04-13 MED ORDER — FLORANEX PO PACK
1.0000 g | PACK | Freq: Every day | ORAL | Status: DC
  Administered 2020-04-14: 1 g via ORAL
  Filled 2020-04-13 (×3): qty 1

## 2020-04-13 MED ORDER — METOCLOPRAMIDE HCL 10 MG PO TABS
5.0000 mg | ORAL_TABLET | Freq: Three times a day (TID) | ORAL | Status: DC | PRN
  Administered 2020-04-14: 10 mg via ORAL

## 2020-04-13 MED ORDER — CEFAZOLIN SODIUM-DEXTROSE 2-4 GM/100ML-% IV SOLN
INTRAVENOUS | Status: AC
  Filled 2020-04-13: qty 100

## 2020-04-13 MED ORDER — DIPHENHYDRAMINE HCL 12.5 MG/5ML PO ELIX
12.5000 mg | ORAL_SOLUTION | ORAL | Status: DC | PRN
  Filled 2020-04-13: qty 10

## 2020-04-13 MED ORDER — HYDROMORPHONE HCL 1 MG/ML IJ SOLN: 0.2500 mg | INTRAMUSCULAR | Status: DC | PRN

## 2020-04-13 MED ORDER — PROPOFOL 500 MG/50ML IV EMUL
INTRAVENOUS | Status: DC | PRN
  Administered 2020-04-13: 100 ug/kg/min via INTRAVENOUS

## 2020-04-13 MED ORDER — PANTOPRAZOLE SODIUM 40 MG PO TBEC
40.0000 mg | DELAYED_RELEASE_TABLET | ORAL | Status: DC
  Administered 2020-04-14 – 2020-04-15 (×2): 40 mg via ORAL
  Filled 2020-04-13 (×2): qty 1

## 2020-04-13 MED ORDER — CEFAZOLIN SODIUM-DEXTROSE 2-4 GM/100ML-% IV SOLN
2.0000 g | INTRAVENOUS | Status: AC
  Administered 2020-04-13: 2 g via INTRAVENOUS

## 2020-04-13 MED ORDER — AMLODIPINE BESYLATE 5 MG PO TABS
2.5000 mg | ORAL_TABLET | ORAL | Status: DC
  Administered 2020-04-15: 2.5 mg via ORAL
  Filled 2020-04-13 (×2): qty 0.5

## 2020-04-13 MED ORDER — PROPOFOL 10 MG/ML IV BOLUS
INTRAVENOUS | Status: DC | PRN
  Administered 2020-04-13: 40 mg via INTRAVENOUS
  Administered 2020-04-13: 20 mg via INTRAVENOUS

## 2020-04-13 MED ORDER — SODIUM CHLORIDE 0.9 % IV SOLN
INTRAVENOUS | Status: DC | PRN
  Administered 2020-04-13: 60 mL

## 2020-04-13 MED ORDER — LACTATED RINGERS IV SOLN: INTRAVENOUS | Status: DC

## 2020-04-13 MED ORDER — KETOROLAC TROMETHAMINE 15 MG/ML IJ SOLN
INTRAMUSCULAR | Status: AC
  Administered 2020-04-13: 15 mg via INTRAVENOUS
  Filled 2020-04-13: qty 1

## 2020-04-13 MED ORDER — CEFAZOLIN SODIUM-DEXTROSE 2-4 GM/100ML-% IV SOLN: 2.0000 g | Freq: Four times a day (QID) | INTRAVENOUS | Status: AC

## 2020-04-13 MED ORDER — PROPOFOL 500 MG/50ML IV EMUL
INTRAVENOUS | Status: AC
  Filled 2020-04-13: qty 50

## 2020-04-13 MED ORDER — BUPIVACAINE HCL (PF) 0.5 % IJ SOLN
INTRAMUSCULAR | Status: AC
  Filled 2020-04-13: qty 10

## 2020-04-13 MED ORDER — ACETAMINOPHEN 10 MG/ML IV SOLN
INTRAVENOUS | Status: AC
  Filled 2020-04-13: qty 300

## 2020-04-13 MED ORDER — KETOROLAC TROMETHAMINE 30 MG/ML IJ SOLN
INTRAMUSCULAR | Status: AC
  Filled 2020-04-13: qty 1

## 2020-04-13 MED ORDER — ACETAMINOPHEN 500 MG PO TABS
ORAL_TABLET | ORAL | Status: AC
  Filled 2020-04-13: qty 2

## 2020-04-13 MED ORDER — METOCLOPRAMIDE HCL 5 MG/ML IJ SOLN: 5.0000 mg | Freq: Three times a day (TID) | INTRAMUSCULAR | Status: DC | PRN

## 2020-04-13 MED ORDER — OXYCODONE HCL 5 MG PO TABS: 10.0000 mg | ORAL_TABLET | ORAL | Status: DC | PRN

## 2020-04-13 MED ORDER — ONDANSETRON HCL 4 MG PO TABS
4.0000 mg | ORAL_TABLET | Freq: Four times a day (QID) | ORAL | Status: DC | PRN
  Administered 2020-04-15: 4 mg via ORAL

## 2020-04-13 SURGICAL SUPPLY — 60 items
BLADE SAW SAG 25X90X1.19 (BLADE) ×2 IMPLANT
BLADE SURG SZ20 CARB STEEL (BLADE) ×2 IMPLANT
BNDG ELASTIC 6X5.8 VLCR NS LF (GAUZE/BANDAGES/DRESSINGS) ×2 IMPLANT
CANISTER SUCT 1200ML W/VALVE (MISCELLANEOUS) ×2 IMPLANT
CEMENT BONE R 1X40 (Cement) ×4 IMPLANT
CEMENT VACUUM MIXING SYSTEM (MISCELLANEOUS) ×2 IMPLANT
CHLORAPREP W/TINT 26 (MISCELLANEOUS) ×2 IMPLANT
COOLER POLAR GLACIER W/PUMP (MISCELLANEOUS) ×2 IMPLANT
COVER MAYO STAND REUSABLE (DRAPES) ×2 IMPLANT
COVER WAND RF STERILE (DRAPES) ×2 IMPLANT
CUFF TOURN SGL QUICK 24 (TOURNIQUET CUFF)
CUFF TOURN SGL QUICK 30 (TOURNIQUET CUFF)
CUFF TOURN SGL QUICK 34 (TOURNIQUET CUFF) ×1
CUFF TRNQT CYL 24X4X16.5-23 (TOURNIQUET CUFF) IMPLANT
CUFF TRNQT CYL 30X4X21-28X (TOURNIQUET CUFF) IMPLANT
CUFF TRNQT CYL 34X4.125X (TOURNIQUET CUFF) ×1 IMPLANT
DRAPE 3/4 80X56 (DRAPES) ×2 IMPLANT
DRAPE IMP U-DRAPE 54X76 (DRAPES) ×2 IMPLANT
DRSG MEPILEX SACRM 8.7X9.8 (GAUZE/BANDAGES/DRESSINGS) IMPLANT
DRSG OPSITE POSTOP 4X10 (GAUZE/BANDAGES/DRESSINGS) ×2 IMPLANT
DRSG OPSITE POSTOP 4X8 (GAUZE/BANDAGES/DRESSINGS) IMPLANT
ELECT REM PT RETURN 9FT ADLT (ELECTROSURGICAL) ×2
ELECTRODE REM PT RTRN 9FT ADLT (ELECTROSURGICAL) ×1 IMPLANT
FEMORAL CR LEFT 65MM (Joint) ×2 IMPLANT
GLOVE BIO SURGEON STRL SZ7.5 (GLOVE) ×8 IMPLANT
GLOVE BIO SURGEON STRL SZ8 (GLOVE) ×8 IMPLANT
GLOVE BIOGEL PI IND STRL 8 (GLOVE) ×1 IMPLANT
GLOVE BIOGEL PI INDICATOR 8 (GLOVE) ×1
GLOVE INDICATOR 8.0 STRL GRN (GLOVE) ×2 IMPLANT
GOWN STRL REUS W/ TWL LRG LVL3 (GOWN DISPOSABLE) ×1 IMPLANT
GOWN STRL REUS W/ TWL XL LVL3 (GOWN DISPOSABLE) ×1 IMPLANT
GOWN STRL REUS W/TWL LRG LVL3 (GOWN DISPOSABLE) ×1
GOWN STRL REUS W/TWL XL LVL3 (GOWN DISPOSABLE) ×1
HOLDER FOLEY CATH W/STRAP (MISCELLANEOUS) IMPLANT
HOOD PEEL AWAY FLYTE STAYCOOL (MISCELLANEOUS) ×6 IMPLANT
INSERT TIB BEARING 67X14 (Insert) ×2 IMPLANT
KIT TURNOVER KIT A (KITS) ×2 IMPLANT
MANIFOLD NEPTUNE II (INSTRUMENTS) ×2 IMPLANT
NEEDLE SPNL 20GX3.5 QUINCKE YW (NEEDLE) ×2 IMPLANT
NS IRRIG 1000ML POUR BTL (IV SOLUTION) ×2 IMPLANT
PACK TOTAL KNEE (MISCELLANEOUS) ×2 IMPLANT
PAD WRAPON POLAR KNEE (MISCELLANEOUS) ×1 IMPLANT
PATELLA SERIES A (Orthopedic Implant) ×2 IMPLANT
PENCIL SMOKE EVACUATOR (MISCELLANEOUS) ×2 IMPLANT
PLATE INTERLOK 6700 (Plate) ×2 IMPLANT
PULSAVAC PLUS IRRIG FAN TIP (DISPOSABLE) ×2
SOL .9 NS 3000ML IRR  AL (IV SOLUTION) ×1
SOL .9 NS 3000ML IRR UROMATIC (IV SOLUTION) ×1 IMPLANT
STAPLER SKIN PROX 35W (STAPLE) ×2 IMPLANT
SUCTION FRAZIER HANDLE 10FR (MISCELLANEOUS) ×1
SUCTION TUBE FRAZIER 10FR DISP (MISCELLANEOUS) ×1 IMPLANT
SUT VIC AB 0 CT1 36 (SUTURE) ×6 IMPLANT
SUT VIC AB 2-0 CT1 27 (SUTURE) ×4
SUT VIC AB 2-0 CT1 TAPERPNT 27 (SUTURE) ×4 IMPLANT
SYR 10ML LL (SYRINGE) ×2 IMPLANT
SYR 20ML LL LF (SYRINGE) ×2 IMPLANT
SYR 30ML LL (SYRINGE) IMPLANT
TIP FAN IRRIG PULSAVAC PLUS (DISPOSABLE) ×1 IMPLANT
TRAY FOLEY MTR SLVR 16FR STAT (SET/KITS/TRAYS/PACK) IMPLANT
WRAPON POLAR PAD KNEE (MISCELLANEOUS) ×2

## 2020-04-13 NOTE — Anesthesia Procedure Notes (Signed)
Spinal  Patient location during procedure: OR Start time: 04/13/2020 7:31 AM End time: 04/13/2020 7:39 AM Staffing Performed: resident/CRNA  Anesthesiologist: Lenard Simmer, MD Resident/CRNA: Lynden Oxford, CRNA Preanesthetic Checklist Completed: patient identified, IV checked, site marked, risks and benefits discussed, surgical consent, monitors and equipment checked, pre-op evaluation and timeout performed Spinal Block Patient position: sitting Prep: DuraPrep Patient monitoring: heart rate, cardiac monitor, continuous pulse ox and blood pressure Approach: midline Location: L3-4 Injection technique: single-shot Needle Needle type: Pencan  Needle gauge: 25 G Needle length: 9 cm Assessment Sensory level: T4

## 2020-04-13 NOTE — Anesthesia Preprocedure Evaluation (Signed)
Anesthesia Evaluation  Patient identified by MRN, date of birth, ID band Patient awake    Reviewed: Allergy & Precautions, H&P , NPO status , Patient's Chart, lab work & pertinent test results, reviewed documented beta blocker date and time   History of Anesthesia Complications Negative for: history of anesthetic complications  Airway Mallampati: III  TM Distance: >3 FB Neck ROM: full    Dental  (+) Dental Advidsory Given, Caps   Pulmonary neg pulmonary ROS,    Pulmonary exam normal breath sounds clear to auscultation       Cardiovascular Exercise Tolerance: Good hypertension, (-) angina(-) Past MI and (-) Cardiac Stents Normal cardiovascular exam(-) dysrhythmias (-) Valvular Problems/Murmurs Rhythm:regular Rate:Normal     Neuro/Psych negative neurological ROS  negative psych ROS   GI/Hepatic Neg liver ROS, GERD  ,  Endo/Other  negative endocrine ROS  Renal/GU negative Renal ROS  negative genitourinary   Musculoskeletal   Abdominal   Peds  Hematology negative hematology ROS (+)   Anesthesia Other Findings Past Medical History: No date: Angina at rest [facility]) No date: Arthritis No date: GERD (gastroesophageal reflux disease) No date: Hypertension   Reproductive/Obstetrics negative OB ROS                             Anesthesia Physical Anesthesia Plan  ASA: II  Anesthesia Plan: Spinal   Post-op Pain Management:    Induction:   PONV Risk Score and Plan: Propofol infusion and TIVA  Airway Management Planned: Natural Airway and Simple Face Mask  Additional Equipment:   Intra-op Plan:   Post-operative Plan:   Informed Consent: I have reviewed the patients History and Physical, chart, labs and discussed the procedure including the risks, benefits and alternatives for the proposed anesthesia with the patient or authorized representative who has indicated his/her understanding and  acceptance.     Dental Advisory Given  Plan Discussed with: Anesthesiologist, CRNA and Surgeon  Anesthesia Plan Comments:         Anesthesia Quick Evaluation

## 2020-04-13 NOTE — Transfer of Care (Signed)
Immediate Anesthesia Transfer of Care Note  Patient: Leotis Pain Rohrig  Procedure(s) Performed: TOTAL KNEE ARTHROPLASTY (Left Knee)  Patient Location: PACU  Anesthesia Type:General  Level of Consciousness: drowsy  Airway & Oxygen Therapy: Patient Spontanous Breathing and Patient connected to face mask oxygen  Post-op Assessment: Report given to RN and Post -op Vital signs reviewed and stable  Post vital signs: Reviewed and stable  Last Vitals:  Vitals Value Taken Time  BP 131/71 04/13/20 0958  Temp 36.6 C 04/13/20 0959  Pulse 68 04/13/20 0959  Resp 21 04/13/20 0959  SpO2 94 % 04/13/20 0959  Vitals shown include unvalidated device data.  Last Pain:  Vitals:   04/13/20 0614  TempSrc: Oral  PainSc: 8          Complications: No complications documented.

## 2020-04-13 NOTE — TOC Initial Note (Signed)
Transition of Care [facility]) - Initial/Assessment Note    Patient Details  Name: Lauren Leblanc MRN: [id number] Date of Birth: 06/17/1950  Transition of Care [facility]) CM/SW Contact:    West Line Cellar, RN Phone Number: 04/13/2020, 3:33 PM  Clinical Narrative:                 Patient already set up with [facility] preop according to Mellissa Kohut from [facility].         Patient Goals and CMS Choice        Expected Discharge Plan and Services                                                Prior Living Arrangements/Services                       Activities of Daily Living Home Assistive Devices/Equipment: Research scientist (physical sciences) (specify quad or straight),Walker (specify type) ADL Screening (condition at time of admission) Patient's cognitive ability adequate to safely complete daily activities?: Yes Is the patient deaf or have difficulty hearing?: No Does the patient have difficulty seeing, even when wearing glasses/contacts?: No Does the patient have difficulty concentrating, remembering, or making decisions?: No Patient able to express need for assistance with ADLs?: Yes Does the patient have difficulty dressing or bathing?: No Independently performs ADLs?: Yes (appropriate for developmental age) Does the patient have difficulty walking or climbing stairs?: Yes Weakness of Legs: None Weakness of Arms/Hands: None  Permission Sought/Granted                  Emotional Assessment              Admission diagnosis:  Status post total knee replacement using cement, left [Z96.652] Patient Active Problem List   Diagnosis Date Noted  . Status post total knee replacement using cement, left 04/13/2020  . Esophageal dysphagia 07/04/2019  . Familial hypercholesterolemia 07/04/2019  . Gastroesophageal reflux disease 07/04/2019  . Obesity (BMI 30.0-34.9) 03/13/2018  . Primary osteoarthritis of left knee 03/13/2018  . Hyperlipidemia, mixed  04/26/2017  . Chest pain 04/24/2017  . Essential hypertension 01/06/2016   PCP:  Duanne Limerick, MD Pharmacy:   [facility] [address] [place] - [address] [place] [address] Phone: [phone] Fax: [phone]     Social Determinants of Health (SDOH) Interventions    Readmission Risk Interventions No flowsheet data found.

## 2020-04-13 NOTE — H&P (Signed)
History of Present Illness: Lauren Leblanc is a 69 y.o.female who is being seen in consultation at the request of Dr. Allena Katz for left knee pain. The symptoms began several years ago, but have gradually worsened, especially over the past few months. The patient has been seeing Dr. Allena Katz who has performed several steroid injections as well as a series of viscosupplementation injections which have provided temporary relief of her symptoms. However, her symptoms have worsened to the point where the shots are no longer as beneficial, so she has been referred to me for further evaluation and treatment. She reports 9/10 pain. The pain is located along the lateral and medial aspect of the knee. The pain is described as aching, stabbing and throbbing. The symptoms are aggravated with normal daily activities, with sleeping, using stairs, at higher levels of activity, walking, standing and standing pivot. She also describes no mechanical symptoms. She has associated swelling and no deformity. She has tried over-the-counter medications, bracing, steroid injections, viscosupplementation injections and ice with limited benefit.  Current Outpatient Medications:  amLODIPine (NORVASC) 2.5 MG tablet   pantoprazole (PROTONIX) 40 MG DR tablet Take 40 mg by mouth once daily   propranolol (INDERAL) 20 MG tablet Take by mouth   Allergies:   Lisinopril Other (See Comments)  Chest pain, dizziness, cough  Codeine Hives  "hair crawling"   Hydrochlorothiazide Anxiety  Jittery feeling   Past Medical History:   Acute costochondritis 04/24/2017   Angina at rest (CMS-HCC) 04/24/2017   Essential hypertension 01/06/2016   GERD (gastroesophageal reflux disease) 06/28/2017   Hyperlipidemia, mixed 04/26/2017   Osteoarthritis   Past Surgical History:   back surgery   breast biopsy bilateral  negative   COLONOSCOPY 07/16/2017  Negative colon biopsy/Repeat 39yrs/TKT   LAPAROSCOPIC TUBAL LIGATION   Family  History:   Coronary Artery Disease (Blocked arteries around heart) Mother   High blood pressure (Hypertension) Mother   Stroke Father   Diabetes Father   High blood pressure (Hypertension) Sister   Allergies Sister   Breast cancer Maternal Aunt   Colon cancer Maternal Aunt   Social History:   Socioeconomic History   Marital status: Married  Spouse name: Not on file   Number of children: Not on file   Years of education: Not on file   Highest education level: Not on file  Occupational History   Not on file  Tobacco Use   Smoking status: Never Smoker   Smokeless tobacco: Never Used  Vaping Use   Vaping Use: Never used  Substance and Sexual Activity   Alcohol use: Never   Drug use: Never   Sexual activity: Defer  Other Topics Concern   Not on file  Social History Narrative   Not on file   Social Determinants of Health:   Physicist, medical Strain: Not on file  Food Insecurity: Not on file  Transportation Needs: Not on file   Review of Systems:  A comprehensive 14 point ROS was performed, reviewed, and the pertinent orthopaedic findings are documented in the HPI.  Physical Exam: Vitals:  03/15/20 1055  BP: 132/78  Weight: 94.2 kg (207 lb 9.6 oz)  Height: 165.1 cm (5\' 5" )  PainSc: 9  PainLoc: Knee   General/Constitutional: Pleasant overweight middle-aged female in no acute distress. Neuro/Psych: Normal mood and affect, oriented to person, place and time. Eyes: Non-icteric. Pupils are equal, round, and reactive to light, and exhibit synchronous movement. Lymphatic: No papable inguinal or femoral adenopathy unless noted in detailed exam. Respiratory: Lungs  clear to auscultation, Normal chest excursion, No wheezes and Non-labored breathing Cardiovascular: Regular rate and rhythm. No murmurs. and No edema, swelling or tenderness, except as noted in detailed exam. Vascular: No edema, swelling or tenderness, except as noted in detailed  exam. Integumentary: No impressive skin lesions present, except as noted in detailed exam. Musculoskeletal: Unremarkable, except as noted in detailed exam.  Left knee exam: GAIT: Moderately antalgic and uses no assistive devices. ALIGNMENT: normal SKIN: unremarkable SWELLING: mild EFFUSION: small WARMTH: no warmth TENDERNESS: moderate over the medial joint line, mild along the lateral joint line ROM: 10 to 100 degrees actively with mild pain in maximal flexion more so than extension McMURRAY'S: equivocal PATELLOFEMORAL: normal tracking with no peri-patellar tenderness and negative apprehension sign CREPITUS: no LACHMAN'S: negative PIVOT SHIFT: negative ANTERIOR DRAWER: negative POSTERIOR DRAWER: negative VARUS/VALGUS: stable  She is neurovascularly intact to the left lower extremity and foot.  Knee Imaging: Recent AP weightbearing of both knees, as well as lateral and merchant views of the left knee are available for review. These films demonstrate severe degenerative changes, primarily involving the medial compartment with 100% medial joint space narrowing. Overall alignment is mild varus. No fractures, lytic lesions, or abnormal calcifications are noted.  Assessment:  Primary osteoarthritis of left knee.   Plan: The treatment options were discussed with the patient. In addition, patient educational materials were provided regarding the diagnosis and treatment options. The patient is quite frustrated by her symptoms and functional limitations, and is ready to consider more aggressive treatment options. Therefore, I have recommended a surgical seizure, specifically a left total knee arthroplasty. The procedure was discussed with the patient, as were the potential risks (including bleeding, infection, nerve and/or blood vessel injury, persistent or recurrent pain, loosening and/or failure of the components, dislocation, need for further surgery, blood clots, strokes, heart attacks  and/or arhythmias, pneumonia, etc.) and benefits. The patient states his/her understanding and wishes to proceed. All of the patient's questions and concerns were answered. She can call any time with further concerns. She will return to work without restrictions. She will follow up post-surgery, routine.  H&P reviewed and patient re-examined. No changes.

## 2020-04-13 NOTE — Op Note (Signed)
04/13/2020  10:02 AM  Patient:   Lauren Leblanc  Pre-Op Diagnosis:   Degenerative joint disease, left knee.  Post-Op Diagnosis:   Same  Procedure:   Left TKA using all-cemented Bi[employer] Vanguard system with a 65 mm PCR femur, a 67 mm tibial tray with a 14 mm anterior stabilized E-poly insert, and a 31 x 8 mm all-poly 3-pegged domed patella.  Surgeon:   Maryagnes Amos, MD  Assistant:   Horris Latino, PA-C   Anesthesia:   Spinal  Findings:   As above  Complications:   None  EBL:   30 cc  Fluids:   800 cc crystalloid  UOP:   None  TT:   90 minutes at 300 mmHg  Drains:   None  Closure:   Staples  Implants:   As above  Brief Clinical Note:   The patient is a 69 year old female with a long history of progressively worsening left knee pain. The patient's symptoms have progressed despite medications, activity modification, injections, etc. The patient's history and examination were consistent with advanced degenerative joint disease of the left knee confirmed by plain radiographs. The patient presents at this time for a left total knee arthroplasty.  Procedure:   The patient was brought into the operating room. After adequate spinal anesthesia was obtained, the patient was lain in the supine position before the left lower extremity was prepped with ChloraPrep solution and draped sterilely. Preoperative antibiotics were administered. After verifying the proper laterality with a surgical timeout, the limb was exsanguinated with an Esmarch and the tourniquet inflated to 300 mmHg. A standard anterior approach to the knee was made through an approximately 7 inch incision. The incision was carried down through the subcutaneous tissues to expose superficial retinaculum. This was split the length of the incision and the medial flap elevated sufficiently to expose the medial retinaculum. The medial retinaculum was incised, leaving a 3-4 mm cuff of tissue on the patella. This was extended  distally along the medial border of the patellar tendon and proximally through the medial third of the quadriceps tendon. A subtotal fat pad excision was performed before the soft tissues were elevated off the anteromedial and anterolateral aspects of the proximal tibia to the level of the collateral ligaments. The anterior portions of the medial and lateral menisci were removed, as was the anterior cruciate ligament. With the knee flexed to 90, the external tibial guide was positioned and the appropriate proximal tibial cut made. This piece was taken to the back table where it was measured and found to be optimally replicated by a 67 mm component.  Attention was directed to the distal femur. The intramedullary canal was accessed through a 3/8" drill hole. The intramedullary guide was inserted and positioned in order to obtain a neutral flexion gap. The intercondylar block was positioned with care taken to avoid notching the anterior cortex of the femur. The appropriate cut was made. Next, the distal cutting block was placed at 5 of valgus alignment. Using the 9 mm slot, the distal cut was made. The distal femur was measured and found to be optimally replicated by the 65 mm component. The 65 mm 4-in-1 cutting block was positioned and first the posterior, then the posterior chamfer, the anterior chamfer, and finally the anterior cuts were made. At this point, the posterior portions medial and lateral menisci were removed. A trial reduction was performed using the appropriate femoral and tibial components with the first the 10 mm, then the 12 mm,  and finally the 14 mm insert. The 14 mm insert demonstrated excellent stability to varus and valgus stressing both in flexion and extension while permitting full extension. Patella tracking was assessed and found to be excellent. Therefore, the tibial guide position was marked on the proximal tibia. The patella thickness was measured and found to be 19 mm. Therefore, the  appropriate cut was made. The patellar surface was measured and found to be optimally replicated by the 31 mm component. The three peg holes were drilled in place before the trial button was inserted. Patella tracking was assessed and found to be excellent, passing the "no thumb test". The lug holes were drilled into the distal femur before the trial component was removed, leaving only the tibial tray. The keel was then created using the appropriate tower, reamer, and punch.  The bony surfaces were prepared for cementing by irrigating them thoroughly with sterile saline solution via the jet lavage system. A bone plug was fashioned from some of the bone that had been removed previously and used to plug the distal femoral canal. In addition, 20 cc of Exparel diluted out to 60 cc with normal saline and 30 cc of 0.5% Sensorcaine were injected into the postero-medial and postero-lateral aspects of the knee, the medial and lateral gutter regions, and the peri-incisional tissues to help with postoperative analgesia. Meanwhile, the cement was being mixed on the back table. When it was ready, the tibial tray was cemented in first. The excess cement was removed using Personal assistant. Next, the femoral component was impacted into place. Again, the excess cement was removed using Personal assistant. The 14 mm trial insert was positioned and the knee brought into extension while the cement hardened. Finally, the patella was cemented into place and secured using the patellar clamp. Again, the excess cement was removed using Personal assistant. Once the cement had hardened, the knee was placed through a range of motion with the findings as described above. Therefore, the trial insert was removed and, after verifying that no cement had been retained posteriorly, the permanent 14 mm anterior stabilized E-polyethylene insert was positioned and secured using the appropriate key locking mechanism. Again the knee was placed through a range  of motion with the findings as described above.  The wound was copiously irrigated with sterile saline solution using the jet lavage system before the quadriceps tendon and retinacular layer were reapproximated using #0 Vicryl interrupted sutures. The superficial retinacular layer also was closed using a running #0 Vicryl suture. A total of 10 cc of transexemic acid (TXA) was injected intra-articularly before the subcutaneous tissues were closed in several layers using 2-0 Vicryl interrupted sutures. The skin was closed using staples. A sterile honeycomb dressing was applied to the skin before the leg was wrapped with an Ace wrap to accommodate the Polar Care device. The patient was then awakened and returned to the recovery room in satisfactory condition after tolerating the procedure well.

## 2020-04-13 NOTE — Evaluation (Signed)
Physical Therapy Evaluation Patient Details Name: Lauren Leblanc MRN: [id number] DOB: 02/24/1951 Today's Date: 04/13/2020   History of Present Illness  Pt is a 69 y.o. female s/p L TKA secondary DJD L knee 04/13/20.  PMH includes htn, angina at rest, syncope, h/o back surgery, acute costochrondritis 04/24/17.  Clinical Impression  Prior to hospital admission, pt was modified independent with ambulation (used quad cane vs B platform rollator depending on situation); lives with her husband in 1 level home with 4 STE (no railing).  Pt's L knee pain 7/10 beginning and end of session at rest but increased to 10/10 with attempted functional mobility.  Attempted to sit up on edge of bed 2x's but unable to bring L LE over side of bed d/t significant increase in L knee pain with this activity (and pt reporting becoming hot and lightheaded) so pt assisted back to bed and repositioned for comfort.  L knee ROM limited d/t significant L knee pain with movement.  Pt would benefit from skilled PT to address noted impairments and functional limitations (see below for any additional details).  D/t limited mobility assessment (d/t significant L knee pain), will continue to assess for appropriate follow-up PT recommendations.    Follow Up Recommendations Other (comment) (TBD)    Equipment Recommendations  Rolling walker with 5" wheels    Recommendations for Other Services OT consult     Precautions / Restrictions Precautions Precautions: Fall;Knee Precaution Booklet Issued: No Restrictions Weight Bearing Restrictions: Yes LLE Weight Bearing: Weight bearing as tolerated      Mobility  Bed Mobility Overal bed mobility: Needs Assistance Bed Mobility: Supine to Sit;Sit to Supine     Supine to sit: Min assist;HOB elevated (x2 trials) Sit to supine: Min assist;HOB elevated   General bed mobility comments: assist for L LE; vc's for scooting towards edge of bed prior to sitting up; attempted to bring  L LE over edge of bed x2 trials but unable d/t significant L knee pain with this activity (pt layed back down in bed both trials d/t L knee pain)    Transfers                 General transfer comment: unable to sit up to attempt  Ambulation/Gait             General Gait Details: unable to sit up to attempt  Stairs            Wheelchair Mobility    Modified Rankin (Stroke Patients Only)       Balance                                             Pertinent Vitals/Pain Pain Assessment: 0-10 Pain Score: 7  Pain Location: L knee Pain Descriptors / Indicators: Aching;Sore;Constant;Discomfort;Grimacing;Guarding;Operative site guarding;Tender Pain Intervention(s): Limited activity within patient's tolerance;Monitored during session;Premedicated before session;Repositioned;Other (comment) (polar care applied and activated)  Vitals stable and WFL throughout treatment session.    Home Living Family/patient expects to be discharged to:: Private residence Living Arrangements: Spouse/significant other Available Help at Discharge: Family Type of Home: House Home Access: Stairs to enter Entrance Stairs-Rails: None Entrance Stairs-Number of Steps: 4 Home Layout: One level Home Equipment: Cane - quad;Other (comment) (B platform rollator)      Prior Function Level of Independence: Independent with assistive device(s)  Comments: Uses quad cane in small areas and in house; uses platform rollator at work     Hand Dominance        Extremity/Trunk Assessment   Upper Extremity Assessment Upper Extremity Assessment: Overall WFL for tasks assessed    Lower Extremity Assessment Lower Extremity Assessment: LLE deficits/detail (R LE WFL) LLE Deficits / Details: fair L quad set strength; at least 3/5 AROM ankle DF/PF LLE: Unable to fully assess due to pain    Cervical / Trunk Assessment Cervical / Trunk Assessment: Normal  Communication    Communication: No difficulties  Cognition Arousal/Alertness: Awake/alert Behavior During Therapy: WFL for tasks assessed/performed Overall Cognitive Status: Within Functional Limits for tasks assessed                                        General Comments General comments (skin integrity, edema, etc.): L knee dressings and polar care in place.  Nursing cleared pt for participation in physical therapy.  Pt agreeable to PT session.    Exercises Total Joint Exercises Ankle Circles/Pumps: AROM;Strengthening;Both;10 reps;Supine Quad Sets: AROM;Strengthening;Both;10 reps;Supine Heel Slides: AAROM;Strengthening;Left;10 reps;Supine (minimal L knee ROM d/t L knee pain) Hip ABduction/ADduction: AAROM;Strengthening;Left;10 reps;Supine Goniometric ROM: L knee extension grossly 10 degrees short of neutral and L knee flexion grossly 45 degrees (limited ROM d/t L knee pain)   Assessment/Plan    PT Assessment Patient needs continued PT services  PT Problem List Decreased strength;Decreased range of motion;Decreased activity tolerance;Decreased balance;Decreased mobility;Decreased knowledge of use of DME;Decreased knowledge of precautions;Pain;Decreased skin integrity       PT Treatment Interventions DME instruction;Gait training;Stair training;Functional mobility training;Therapeutic activities;Therapeutic exercise;Balance training;Patient/family education    PT Goals (Current goals can be found in the Care Plan section)  Acute Rehab PT Goals Patient Stated Goal: to improve pain control PT Goal Formulation: With patient Time For Goal Achievement: 04/27/20 Potential to Achieve Goals: Good    Frequency BID   Barriers to discharge        Co-evaluation               AM-PAC PT "6 Clicks" Mobility  Outcome Measure Help needed turning from your back to your side while in a flat bed without using bedrails?: A Little Help needed moving from lying on your back to sitting  on the side of a flat bed without using bedrails?: A Little Help needed moving to and from a bed to a chair (including a wheelchair)?: A Lot Help needed standing up from a chair using your arms (e.g., wheelchair or bedside chair)?: A Lot Help needed to walk in hospital room?: A Lot Help needed climbing 3-5 steps with a railing? : Total 6 Click Score: 13    End of Session Equipment Utilized During Treatment: Oxygen (2 L O2 via nasal cannula) Activity Tolerance: Patient limited by pain Patient left: in bed;with call bell/phone within reach;with bed alarm set;with family/visitor present;with SCD's reapplied;Other (comment) (B heels floating via towel rolls; polar care in place and activated) Nurse Communication: Mobility status;Precautions;Weight bearing status;Other (comment) (pt's pain status) PT Visit Diagnosis: Other abnormalities of gait and mobility (R26.89);Muscle weakness (generalized) (M62.81);Difficulty in walking, not elsewhere classified (R26.2);Pain Pain - Right/Left: Left Pain - part of body: Knee    Time: 7902-4097 PT Time Calculation (min) (ACUTE ONLY): 33 min   Charges:   PT Evaluation $PT Eval Low Complexity: 1 Low PT Treatments $Therapeutic Exercise:  8-22 mins $Therapeutic Activity: 8-22 mins      Hendricks Limes, PT 04/13/20, 4:25 PM

## 2020-04-13 NOTE — Plan of Care (Signed)
Patient just arrived but is accepting of education

## 2020-04-14 DIAGNOSIS — R531 Weakness: Secondary | ICD-10-CM | POA: Diagnosis not present

## 2020-04-14 DIAGNOSIS — M1712 Unilateral primary osteoarthritis, left knee: Secondary | ICD-10-CM | POA: Diagnosis not present

## 2020-04-14 DIAGNOSIS — Z96652 Presence of left artificial knee joint: Secondary | ICD-10-CM | POA: Diagnosis not present

## 2020-04-14 LAB — CBC
HCT: 30.8 % — ABNORMAL LOW (ref 36.0–46.0)
Hemoglobin: 10.3 g/dL — ABNORMAL LOW (ref 12.0–15.0)
MCH: 30 pg (ref 26.0–34.0)
MCHC: 33.4 g/dL (ref 30.0–36.0)
MCV: 89.8 fL (ref 80.0–100.0)
Platelets: 215 10*3/uL (ref 150–400)
RBC: 3.43 MIL/uL — ABNORMAL LOW (ref 3.87–5.11)
RDW: 13.1 % (ref 11.5–15.5)
WBC: 8.8 10*3/uL (ref 4.0–10.5)
nRBC: 0 % (ref 0.0–0.2)

## 2020-04-14 LAB — BASIC METABOLIC PANEL
Anion gap: 11 (ref 5–15)
BUN: 19 mg/dL (ref 8–23)
CO2: 22 mmol/L (ref 22–32)
Calcium: 8.3 mg/dL — ABNORMAL LOW (ref 8.9–10.3)
Chloride: 101 mmol/L (ref 98–111)
Creatinine, Ser: 0.81 mg/dL (ref 0.44–1.00)
GFR, Estimated: >60 mL/min (ref >60)
Glucose, Bld: 110 mg/dL — ABNORMAL HIGH (ref 70–99)
Potassium: 3.8 mmol/L (ref 3.5–5.1)
Sodium: 134 mmol/L — ABNORMAL LOW (ref 135–145)

## 2020-04-14 MED ORDER — CEFAZOLIN SODIUM-DEXTROSE 2-4 GM/100ML-% IV SOLN
INTRAVENOUS | Status: AC
  Administered 2020-04-14: 2 g via INTRAVENOUS
  Filled 2020-04-14: qty 100

## 2020-04-14 MED ORDER — ENOXAPARIN SODIUM 40 MG/0.4ML ~~LOC~~ SOLN
SUBCUTANEOUS | Status: AC
  Administered 2020-04-14: 40 mg via SUBCUTANEOUS
  Filled 2020-04-14: qty 0.4

## 2020-04-14 MED ORDER — OXYCODONE HCL 5 MG PO TABS
ORAL_TABLET | ORAL | Status: AC
  Filled 2020-04-14: qty 2

## 2020-04-14 MED ORDER — ACETAMINOPHEN 325 MG PO TABS
ORAL_TABLET | ORAL | Status: AC
  Filled 2020-04-14: qty 2

## 2020-04-14 MED ORDER — ACETAMINOPHEN 500 MG PO TABS
ORAL_TABLET | ORAL | Status: AC
  Administered 2020-04-14: 650 mg via ORAL
  Filled 2020-04-14: qty 2

## 2020-04-14 MED ORDER — APIXABAN 2.5 MG PO TABS: 2.5000 mg | ORAL_TABLET | Freq: Two times a day (BID) | ORAL | 0 refills | Status: DC

## 2020-04-14 MED ORDER — ACETAMINOPHEN 500 MG PO TABS
ORAL_TABLET | ORAL | Status: AC
  Administered 2020-04-14: 1000 mg via ORAL
  Filled 2020-04-14: qty 2

## 2020-04-14 MED ORDER — ONDANSETRON HCL 4 MG PO TABS: 4.0000 mg | ORAL_TABLET | Freq: Four times a day (QID) | ORAL | 0 refills | Status: DC | PRN

## 2020-04-14 MED ORDER — OXYCODONE HCL 5 MG PO TABS
ORAL_TABLET | ORAL | Status: AC
  Administered 2020-04-14: 10 mg via ORAL
  Filled 2020-04-14: qty 2

## 2020-04-14 MED ORDER — KETOROLAC TROMETHAMINE 15 MG/ML IJ SOLN
INTRAMUSCULAR | Status: AC
  Filled 2020-04-14: qty 1

## 2020-04-14 MED ORDER — KETOROLAC TROMETHAMINE 15 MG/ML IJ SOLN
INTRAMUSCULAR | Status: AC
  Administered 2020-04-14: 15 mg via INTRAVENOUS
  Filled 2020-04-14: qty 1

## 2020-04-14 MED ORDER — METOCLOPRAMIDE HCL 10 MG PO TABS
ORAL_TABLET | ORAL | Status: AC
  Filled 2020-04-14: qty 1

## 2020-04-14 MED ORDER — OXYCODONE HCL 5 MG PO TABS: 5.0000 mg | ORAL_TABLET | ORAL | 0 refills | Status: DC | PRN

## 2020-04-14 MED ORDER — TRAMADOL HCL 50 MG PO TABS: 50.0000 mg | ORAL_TABLET | Freq: Four times a day (QID) | ORAL | 0 refills | Status: DC | PRN

## 2020-04-14 MED ORDER — KETOROLAC TROMETHAMINE 15 MG/ML IJ SOLN
15.0000 mg | Freq: Four times a day (QID) | INTRAMUSCULAR | Status: AC
  Administered 2020-04-14: 15 mg via INTRAVENOUS

## 2020-04-14 MED ORDER — ONDANSETRON HCL 4 MG/2ML IJ SOLN
INTRAMUSCULAR | Status: AC
  Filled 2020-04-14: qty 2

## 2020-04-14 NOTE — Discharge Summary (Signed)
Physician Discharge Summary  Patient ID: Lauren Leblanc MRN: [id number] DOB/AGE: 06/15/1950 69 y.o.  Admit date: 04/13/2020 Discharge date: 04/15/2020  Admission Diagnoses:  Status post total knee replacement using cement, left [Z96.652]  Discharge Diagnoses: Patient Active Problem List   Diagnosis Date Noted  . Status post total knee replacement using cement, left 04/13/2020  . Esophageal dysphagia 07/04/2019  . Familial hypercholesterolemia 07/04/2019  . Gastroesophageal reflux disease 07/04/2019  . Obesity (BMI 30.0-34.9) 03/13/2018  . Primary osteoarthritis of left knee 03/13/2018  . Hyperlipidemia, mixed 04/26/2017  . Chest pain 04/24/2017  . Essential hypertension 01/06/2016    Past Medical History:  Diagnosis Date  . Angina at rest Lauren Leblanc Acute Care)   . Arthritis   . GERD (gastroesophageal reflux disease)   . Hypertension     Transfusion: None.   Consultants (if any):   Discharged Condition: Improved  Hospital Course: Lauren Leblanc is an 69 y.o. female who was admitted 04/13/2020 with a diagnosis of primary osteoarthritis of the left knee and went to the operating room on 04/13/2020 and underwent the above named procedures.    Surgeries: Procedure(s): TOTAL KNEE ARTHROPLASTY on 04/13/2020 Patient tolerated the surgery well. Taken to PACU where she was stabilized and then transferred to the orthopedic floor.  Started on Lovenox 40mg  q 24 hrs. Foot pumps applied bilaterally at 80 mm. Heels elevated on bed with rolled towels. No evidence of DVT. Negative Homan. Physical therapy started on day #1 for gait training and transfer. OT started day #1 for ADL and assisted devices.  Patient's IV was removed on POD2 prior to discharge home.  Implants: Left TKA using all-cemented [employer] Vanguard system with a 65 mm PCR femur, a 67 mm tibial tray with a 14 mm anterior stabilized E-poly insert, and a 31 x 8 mm all-poly 3-pegged domed patella.  She was given perioperative  antibiotics:  Anti-infectives (From admission, onward)   Start     Dose/Rate Route Frequency Ordered Stop   04/13/20 1400  ceFAZolin (ANCEF) IVPB 2g/100 mL premix        2 g 200 mL/hr over 30 Minutes Intravenous Every 6 hours 04/13/20 1028 04/14/20 0335   04/13/20 0630  ceFAZolin (ANCEF) IVPB 2g/100 mL premix        2 g 200 mL/hr over 30 Minutes Intravenous On call to O.R. 04/13/20 04/15/20 04/13/20 0746   04/13/20 0623  ceFAZolin (ANCEF) 2-4 GM/100ML-% IVPB       Note to Pharmacy: 04/15/20   : cabinet override      04/13/20 04/15/20 04/13/20 0751    .  She was given sequential compression devices, early ambulation, and Lovenox for DVT prophylaxis.  She benefited maximally from the hospital stay and there were no complications.    Recent vital signs:  Vitals:   04/15/20 0249 04/15/20 0631  BP: (!) 144/69 (!) 146/69  Pulse: 71 66  Resp: 16 18  Temp: 97.9 F (36.6 C)   SpO2: 96% 96%    Recent laboratory studies:  Lab Results  Component Value Date   HGB 9.6 (L) 04/15/2020   HGB 10.3 (L) 04/14/2020   HGB 12.0 04/02/2020   Lab Results  Component Value Date   WBC 8.5 04/15/2020   PLT 207 04/15/2020   No results found for: INR Lab Results  Component Value Date   NA 138 04/15/2020   K 4.1 04/15/2020   CL 103 04/15/2020   CO2 27 04/15/2020   BUN 16 04/15/2020   CREATININE 0.81  04/15/2020   GLUCOSE 110 (H) 04/15/2020    Discharge Medications:   Allergies as of 04/15/2020      Reactions   Lisinopril Other (See Comments)   Chest pain, dizziness, cough   Codeine Hives   "hair crawling"   Hydrochlorothiazide Anxiety   Jittery feeling      Medication List    TAKE these medications   amLODipine 2.5 MG tablet Commonly known as: NORVASC Take 2.5 mg by mouth every morning.   apixaban 2.5 MG Tabs tablet Commonly known as: Eliquis Take 1 tablet (2.5 mg total) by mouth 2 (two) times daily for 14 days.   CALCIUM 1200 PO Take 1 tablet by mouth daily.   CULTURELLE  DIGESTIVE HEALTH PO Take 1 capsule by mouth daily.   ondansetron 4 MG tablet Commonly known as: ZOFRAN Take 1 tablet (4 mg total) by mouth every 6 (six) hours as needed for nausea.   ONE-A-DAY WOMENS 50 PLUS PO Take 1 tablet by mouth daily.   OVER THE COUNTER MEDICATION Take 2 each by mouth daily. Neuriva   oxyCODONE 5 MG immediate release tablet Commonly known as: Oxy IR/ROXICODONE Take 1-2 tablets (5-10 mg total) by mouth every 4 (four) hours as needed for moderate pain.   pantoprazole 40 MG tablet Commonly known as: PROTONIX Take 1 tablet (40 mg total) by mouth daily. What changed: when to take this   propranolol 20 MG tablet Commonly known as: INDERAL Take 1 tablet (20 mg total) by mouth 2 (two) times daily.   traMADol 50 MG tablet Commonly known as: ULTRAM Take 1-2 tablets (50-100 mg total) by mouth every 6 (six) hours as needed for moderate pain.   Vitamin D (Cholecalciferol) 25 MCG (1000 UT) Tabs Take 1,000 Units by mouth daily.            Durable Medical Equipment  (From admission, onward)         Start     Ordered   04/13/20 1029  DME Bedside commode  Once       Question:  Patient needs a bedside commode to treat with the following condition  Answer:  Status post total knee replacement using cement, left   04/13/20 1028   04/13/20 1029  DME 3 n 1  Once        04/13/20 1028   04/13/20 1029  DME Walker rolling  Once       Question Answer Comment  Walker: With 5 Inch Wheels   Patient needs a walker to treat with the following condition Status post total knee replacement using cement, left      04/13/20 1028          Diagnostic Studies: DG Knee Left Port  Result Date: 04/13/2020 CLINICAL DATA:  Status post left total knee arthroplasty. EXAM: PORTABLE LEFT KNEE - 1-2 VIEW COMPARISON:  04/04/2018 FINDINGS: Postoperative changes from left total knee arthroplasty identified. The hardware components are in anatomic alignment. No periprosthetic fracture.  Gas noted within the joint space and there are skin staples identified along the midline the of the knee. IMPRESSION: Status post left total knee arthroplasty. Electronically Signed   By: Signa Kell M.D.   On: 04/13/2020 10:29   Disposition: Plan for discharge home with HHPT.  Will transition from Lovenox to Eliquis upon discharge home for DVT prevention.   Follow-up Information    Dedra Skeens, PA-C Follow up in 14 day(s).   Specialty: Orthopedic Surgery Why: Staple Removal. Contact information: [address] [facility] [address]  [address] [place] [address] [phone]              Signed: Meriel Pica PA-C 04/15/2020, 8:00 AM

## 2020-04-14 NOTE — Progress Notes (Signed)
PT Cancellation Note  Patient Details Name: AMAIAH CRISTIANO MRN: [id number] DOB: [birth year]   Cancelled Treatment:     PT attempt. Pt continues to have nausea/vomiting. Will return later this afternoon when pt is more appropriate to participate.    Rushie Chestnut 04/14/2020, 1:04 PM

## 2020-04-14 NOTE — Evaluation (Signed)
Occupational Therapy Evaluation Patient Details Name: Lauren Leblanc MRN: [id number] DOB: February 09, 1951 Today's Date: 04/14/2020    History of Present Illness Pt is a 69 y.o. female s/p L TKA secondary DJD L knee 04/13/20.  PMH includes htn, angina at rest, syncope, h/o back surgery, acute costochrondritis 04/24/17.   Clinical Impression   Lauren Leblanc was seen for OT evaluation this date, POD#1 from above surgery. Pt was independent in all ADLs prior to surgery, however occasionally using a quad cane or bilateral platform walker for community distances. Pt is eager to return to PLOF with less pain and improved safety and independence. Pt currently requires minimal assist for LB dressing while in seated position due to pain and limited AROM of L knee. Pt instructed in polar care mgt, falls prevention strategies, home/routines modifications, DME/AE for LB bathing and dressing tasks, and compression stocking mgt. Pt would benefit from skilled OT services including additional instruction in dressing techniques with or without assistive devices for dressing and bathing skills to support recall and carryover prior to discharge and ultimately to maximize safety, independence, and minimize falls risk and caregiver burden. Recommend HHOT upon hospital DC to maximize pt safety and return to functional independence during meaningful occupations of daily life.     Follow Up Recommendations  Home health OT;Supervision - Intermittent    Equipment Recommendations  3 in 1 bedside commode    Recommendations for Other Services       Precautions / Restrictions Precautions Precautions: Fall;Knee Precaution Booklet Issued: Yes (comment) Restrictions Weight Bearing Restrictions: Yes LLE Weight Bearing: Weight bearing as tolerated      Mobility Bed Mobility Overal bed mobility: Needs Assistance Bed Mobility: Supine to Sit     Supine to sit: Min assist;HOB elevated     General bed mobility  comments: Deferred. Pt seated EOB/in recliner at start/end of session.    Transfers Overall transfer level: Needs assistance Equipment used: Rolling walker (2 wheeled) Transfers: Sit to/from Stand Sit to Stand: Supervision Stand pivot transfers: Min guard       General transfer comment: Pt performs functional mobility within room space with good safety awareness, and use of RW.    Balance Overall balance assessment: Needs assistance Sitting-balance support: No upper extremity supported;Feet supported Sitting balance-Leahy Scale: Good Sitting balance - Comments: steady sitting reaching within BOS   Standing balance support: Bilateral upper extremity supported;During functional activity Standing balance-Leahy Scale: Fair Standing balance comment: Generally requires BUE support on RW during standing tasks.                           ADL either performed or assessed with clinical judgement   ADL Overall ADL's : Needs assistance/impaired                                       General ADL Comments: Pt functionally limited by increased pain, decreased AROM of her LLE. She requires MIN A for LB ADL management, supervision for functional mobility/transfers.     Vision Patient Visual Report: No change from baseline       Perception     Praxis      Pertinent Vitals/Pain Pain Assessment: 0-10 Pain Score: 7  Pain Location: L knee Pain Descriptors / Indicators: Aching;Sore;Constant;Discomfort;Grimacing;Guarding;Operative site guarding;Tender Pain Intervention(s): Limited activity within patient's tolerance;Monitored during session;Repositioned;Ice applied     Hand Dominance  Extremity/Trunk Assessment Upper Extremity Assessment Upper Extremity Assessment: Overall WFL for tasks assessed   Lower Extremity Assessment Lower Extremity Assessment: LLE deficits/detail LLE Deficits / Details: S/p L TKA - expected post-op weakness/pain. LLE  Coordination: decreased gross motor   Cervical / Trunk Assessment Cervical / Trunk Assessment: Normal   Communication Communication Communication: No difficulties   Cognition Arousal/Alertness: Awake/alert Behavior During Therapy: WFL for tasks assessed/performed Overall Cognitive Status: Within Functional Limits for tasks assessed                                 General Comments: Pt A&O, however notably slow processing t/o session. Requires increased time/cueing to perform tasks.   General Comments       Exercises Exercises: Total Joint Total Joint Exercises Ankle Circles/Pumps: Other (comment) (therapist demonstrated) Quad Sets: Other (comment) (therapist demonstrated correct performance to pt) Knee Flexion: AROM;5 reps;Seated Other Exercises Other Exercises: Pt educated in falls prevention strategies, safe use of AE/DME for LB ADL management, compression stocking management, polar care management, , and routines modifications to support safety and fxl independence during meaningful occupations of daily life.   Shoulder Instructions      Home Living Family/patient expects to be discharged to:: Private residence Living Arrangements: Spouse/significant other Available Help at Discharge: Family Type of Home: House Home Access: Stairs to enter [employer] of Steps: 4 Entrance Stairs-Rails: None Home Layout: One level     Bathroom Shower/Tub: Tub/shower unit;Walk-in shower   Bathroom Toilet: Handicapped height     Home Equipment: Cane - quad;Other (comment) (B platform rollator)          Prior Functioning/Environment Level of Independence: Independent with assistive device(s)        Comments: Uses quad cane in small areas and in house; uses platform rollator at work, denies need for assist with ADL mgt at baseline.        OT Problem List: Decreased strength;Decreased coordination;Pain;Decreased range of motion;Impaired balance (sitting  and/or standing);Decreased knowledge of use of DME or AE;Decreased knowledge of precautions;Decreased safety awareness      OT Treatment/Interventions: Self-care/ADL training;Therapeutic exercise;Therapeutic activities;DME and/or AE instruction;Patient/family education;Balance training    OT Goals(Current goals can be found in the care plan section) Acute Rehab OT Goals Patient Stated Goal: to be safe OT Goal Formulation: With patient Time For Goal Achievement: 04/28/20 Potential to Achieve Goals: Good ADL Goals Pt Will Perform Lower Body Dressing: sit to/from stand;with modified independence (c LRAD PRN for improved safety and functional indep. upon hospital DC.) Pt Will Transfer to Toilet: bedside commode;with modified independence;ambulating (c LRAD PRN for improved safety and functional indep. upon hospital DC.) Pt Will Perform Toileting - Clothing Manipulation and hygiene: with modified independence;sit to/from stand;with adaptive equipment (c LRAD PRN for improved safety and functional indep. upon hospital DC.)  OT Frequency: Min 1X/week   Barriers to D/C:            Co-evaluation              AM-PAC OT "6 Clicks" Daily Activity     Outcome Measure Help from another person eating meals?: None Help from another person taking care of personal grooming?: A Little Help from another person toileting, which includes using toliet, bedpan, or urinal?: A Little Help from another person bathing (including washing, rinsing, drying)?: A Little Help from another person to put on and taking off regular upper body clothing?: A Little Help from  another person to put on and taking off regular lower body clothing?: A Little 6 Click Score: 19   End of Session Equipment Utilized During Treatment: Gait belt;Rolling walker Nurse Communication: Mobility status  Activity Tolerance: Patient tolerated treatment well Patient left: in chair;with chair alarm set;with call bell/phone within  reach  OT Visit Diagnosis: Other abnormalities of gait and mobility (R26.89);Pain Pain - Right/Left: Right Pain - part of body: Knee                Time: 4854-6270 OT Time Calculation (min): 25 min Charges:  OT General Charges $OT Visit: 1 Visit OT Evaluation $OT Eval Moderate Complexity: 1 Mod OT Treatments $Self Care/Home Management : 8-22 mins  Rockney Ghee, M.S., OTR/L Ascom: [phone] 04/14/20, 4:24 PM

## 2020-04-14 NOTE — Progress Notes (Signed)
  Subjective: 1 Day Post-Op Procedure(s) (LRB): TOTAL KNEE ARTHROPLASTY (Left) Patient reports pain as mild.   Patient is well, and has had no acute complaints or problems Plan is to go Home after hospital stay. Negative for chest pain and shortness of breath Fever: no Gastrointestinal:Negative for nausea and vomiting, did have some nausea when working with PT.  Objective: Vital signs in last 24 hours: Temp:  [97 F (36.1 C)-97.2 F (36.2 C)] 97 F (36.1 C) (12/22 0305) Pulse Rate:  [59-68] 65 (12/22 1035) Resp:  [14-18] 18 (12/22 0609) BP: (109-156)/(59-99) 138/72 (12/22 1035) SpO2:  [90 %-98 %] 97 % (12/22 0609)  Intake/Output from previous day:  Intake/Output Summary (Last 24 hours) at 04/14/2020 1158 Last data filed at 04/14/2020 0617 Gross per 24 hour  Intake 775 ml  Output --  Net 775 ml    Intake/Output this shift: No intake/output data recorded.  Labs: Recent Labs    04/14/20 0627  HGB 10.3*   Recent Labs    04/14/20 0627  WBC 8.8  RBC 3.43*  HCT 30.8*  PLT 215   Recent Labs    04/14/20 0627  NA 134*  K 3.8  CL 101  CO2 22  BUN 19  CREATININE 0.81  GLUCOSE 110*  CALCIUM 8.3*   No results for input(s): LABPT, INR in the last 72 hours.  EXAM General - Patient is Alert, Appropriate and Oriented Extremity - ABD soft Sensation intact distally Intact pulses distally Dorsiflexion/Plantar flexion intact Incision: scant drainage No cellulitis present Dressing/Incision - blood tinged drainage Motor Function - intact, moving foot and toes well on exam.  Abdomen soft with normal bowel sounds this AM.  Past Medical History:  Diagnosis Date  . Angina at rest [facility])   . Arthritis   . GERD (gastroesophageal reflux disease)   . Hypertension     Assessment/Plan: 1 Day Post-Op Procedure(s) (LRB): TOTAL KNEE ARTHROPLASTY (Left) Active Problems:   Status post total knee replacement using cement, left  Estimated body mass index is 34.23 kg/m as  calculated from the following:   Height as of this encounter: 5\' 5"  (1.651 m).   Weight as of this encounter: 93.3 kg. Advance diet Up with therapy D/C IV fluids when tolerating po intake.  Pain is much improved today.  Patient with severe pain following spinal anesthesia wearing off yesterday. Patient has worked with PT, plan is for discharge home with HHPT. Patient states that she is passing gas without pain. Will plan on possible discharge home today vs. Tomorrow depending on progress with PT. Will transition to Eliquis for DVT prevention upon discharge.  DVT Prophylaxis - Lovenox, Foot Pumps and TED hose Weight-Bearing as tolerated to left leg  J. , PA-C [facility] Orth[facility]edic [facility] 04/14/2020, 11:58 AM

## 2020-04-14 NOTE — Discharge Instructions (Signed)

## 2020-04-14 NOTE — Progress Notes (Signed)
Physical Therapy Treatment Patient Details Name: Lauren Leblanc MRN: [id number] DOB: 12-19-1950 Today's Date: 04/14/2020    History of Present Illness Pt is a 69 y.o. female s/p L TKA secondary DJD L knee 04/13/20.  PMH includes htn, angina at rest, syncope, h/o back surgery, acute costochrondritis 04/24/17.    PT Comments    Author return for PM PT session. Pt reports nausea is much improved now that her IV is working for medications. She agrees to session and is cooperative throughout. Lengthy education on expectations going forward. She states understanding and was willing to participate in session. Required min assist to exit R side of bed. Stood pivot to [facility]. Urinated then stood to RW and ambulated ~ 100 ft with slow step to antalgic gait pattern. Overall pt demonstrates safe gait abilities without LOB or safety concern. Author issued HEP handout with several exercises demonstrated. She was able to perform AROM seated EOB to ~ 80 degrees flexion. OT arrived at conclusion of session to perform OT evaluation. PT will return next date to progress pt per POC. Recommend DC home with [facility] services once deemed medically stable. She will continue to benefit from skilled PT to address deficits with ROM/strength/and overall safety while improving independence.     Follow Up Recommendations  Home health PT     Equipment Recommendations  Other (comment) (pt has recieved all personal equipment)    Recommendations for Other Services       Precautions / Restrictions Precautions Precautions: Fall;Knee Precaution Booklet Issued: Yes (comment) Restrictions Weight Bearing Restrictions: Yes LLE Weight Bearing: Weight bearing as tolerated    Mobility  Bed Mobility Overal bed mobility: Needs Assistance Bed Mobility: Supine to Sit     Supine to sit: Min assist;HOB elevated     General bed mobility comments: Pt required min assist to exit R side of bed. sat EOB prior to standing and going to  [facility].  Transfers Overall transfer level: Needs assistance Equipment used: Rolling walker (2 wheeled) Transfers: Sit to/from Stand Sit to Stand: Min guard Stand pivot transfers: Min guard       General transfer comment: Pt was able to stand pivot to BSC from EOB then performed sit to stand from [facility] to RW. vcs for proper handplacement and improved technique throughout.  Ambulation/Gait Ambulation/Gait assistance: Supervision Gait Distance (Feet): 100 Feet Assistive device: Rolling walker (2 wheeled) Gait Pattern/deviations: Step-to pattern;Antalgic Gait velocity: decreased   General Gait Details: antalgic; decreased stance time L LE. Pt struggles getting LLE flat on floor during stance phase      Balance Overall balance assessment: Needs assistance Sitting-balance support: No upper extremity supported;Feet supported Sitting balance-Leahy Scale: Good Sitting balance - Comments: steady sitting reaching within BOS   Standing balance support: Bilateral upper extremity supported;During functional activity Standing balance-Leahy Scale: Fair         Cognition Arousal/Alertness: Awake/alert Behavior During Therapy: WFL for tasks assessed/performed Overall Cognitive Status: Within Functional Limits for tasks assessed      General Comments: Pt is A and O. Agrees to PT session and is cooperative and pleasant throughout. does endorse 7/10 pain with wt bearing      Exercises Total Joint Exercises Ankle Circles/Pumps: Other (comment) (therapist demonstrated) Quad Sets: Other (comment) (therapist demonstrated correct performance to pt) Knee Flexion: AROM;5 reps;Seated        Pertinent Vitals/Pain Pain Assessment: 0-10 Pain Score: 7  Pain Location: L knee Pain Descriptors / Indicators: Aching;Sore;Constant;Discomfort;Grimacing;Guarding;Operative site guarding;Tender Pain Intervention(s): Limited activity within  patient's tolerance;Monitored during session;Premedicated before  session;Repositioned;Ice applied           PT Goals (current goals can now be found in the care plan section) Acute Rehab PT Goals Patient Stated Goal: to be safe Progress towards PT goals: Progressing toward goals    Frequency    BID      PT Plan Current plan remains appropriate    AM-PAC PT "6 Clicks" Mobility   Outcome Measure  Help needed turning from your back to your side while in a flat bed without using bedrails?: A Little Help needed moving from lying on your back to sitting on the side of a flat bed without using bedrails?: A Little Help needed moving to and from a bed to a chair (including a wheelchair)?: A Little Help needed standing up from a chair using your arms (e.g., wheelchair or bedside chair)?: A Little Help needed to walk in hospital room?: A Little Help needed climbing 3-5 steps with a railing? : A Little 6 Click Score: 18    End of Session   Activity Tolerance: Patient tolerated treatment well Patient left: in bed;Other (comment) (Seated EOB with OT in room to perform evaluation.)   PT Visit Diagnosis: Other abnormalities of gait and mobility (R26.89);Muscle weakness (generalized) (M62.81);Difficulty in walking, not elsewhere classified (R26.2);Pain Pain - Right/Left: Left Pain - part of body: Knee     Time: 1455-1520 PT Time Calculation (min) (ACUTE ONLY): 25 min  Charges:  $Gait Training: 8-22 mins $Therapeutic Exercise: 8-22 mins                     Jetta Lout PTA 04/14/20, 3:41 PM

## 2020-04-14 NOTE — Progress Notes (Signed)
Physical Therapy Treatment Patient Details Name: Lauren Leblanc MRN: [id number] DOB: 12-25-1950 Today's Date: 04/14/2020    History of Present Illness Pt is a 69 y.o. female s/p L TKA secondary DJD L knee 04/13/20.  PMH includes htn, angina at rest, syncope, h/o back surgery, acute costochrondritis 04/24/17.    PT Comments    Pt resting in bed upon PT arrival; 0/10 L knee pain beginning/end of session (increased L knee pain noted more with transfers than ambulation during session).  Requiring assist and vc's for L LE ex's semi-supine in bed.  Min assist L LE for bed mobility; min assist to CGA with transfers; and CGA ambulating 60 feet with RW.  Vc's required for bed mobility, transfers, and ambulation technique.  Pt became nauseas post ambulation (with pt sitting on edge of bed) and nursing brought pt nausea medication.  L knee flexion AROM to 78 degrees.  CPM set up and placed to pt's L LE with pt resting in bed per discussion with pt's nurse (pt tolerated 40-85 degrees PROM).  Will continue to focus on strengthening, knee ROM, and progressive functional mobility per pt tolerance.      Follow Up Recommendations  Home health PT     Equipment Recommendations  Rolling walker with 5" wheels;3in1 (PT)    Recommendations for Other Services OT consult     Precautions / Restrictions Precautions Precautions: Fall;Knee Precaution Booklet Issued: No Restrictions Weight Bearing Restrictions: Yes LLE Weight Bearing: Weight bearing as tolerated    Mobility  Bed Mobility Overal bed mobility: Needs Assistance Bed Mobility: Supine to Sit;Sit to Supine     Supine to sit: Min assist;HOB elevated Sit to supine: Min assist;HOB elevated   General bed mobility comments: assist for L LE; semi-supine to sitting edge of bed pt requiring brief sitting rest break with R LE on ground and L LE resting on bed before bringing L LE down off of bed (d/t nausea)  Transfers Overall transfer level:  Needs assistance Equipment used: Rolling walker (2 wheeled) Transfers: Sit to/from [employer] Sit to Stand: Min assist;Min guard Stand pivot transfers: Min guard (stand step turn recliner to bed with RW)       General transfer comment: x1 trial standing from bed (min assist) and x1 trial standing from recliner (CGA); vc's for UE/LE placement and overall technique  Ambulation/Gait Ambulation/Gait assistance: Min guard Gait Distance (Feet): 60 Feet Assistive device: Rolling walker (2 wheeled) Gait Pattern/deviations: Step-to pattern Gait velocity: decreased   General Gait Details: antalgic; decreased stance time L LE; vc's for walker use and stepping technique   Stairs             Wheelchair Mobility    Modified Rankin (Stroke Patients Only)       Balance Overall balance assessment: Needs assistance Sitting-balance support: No upper extremity supported;Feet supported Sitting balance-Leahy Scale: Good Sitting balance - Comments: steady sitting reaching within BOS   Standing balance support: Single extremity supported Standing balance-Leahy Scale: Fair Standing balance comment: steady static standing with at least single UE support                            Cognition Arousal/Alertness: Awake/alert Behavior During Therapy: WFL for tasks assessed/performed Overall Cognitive Status: Within Functional Limits for tasks assessed  Exercises Total Joint Exercises Ankle Circles/Pumps: AROM;Strengthening;Both;10 reps;Supine Quad Sets: AROM;Strengthening;Both;10 reps;Supine Short Arc Quad: AAROM;Strengthening;Left;10 reps;Supine Heel Slides: AAROM;Strengthening;Left;10 reps;Supine Hip ABduction/ADduction: AAROM;Strengthening;Left;10 reps;Supine Goniometric ROM: L knee extension 8 degrees short of neutral semi-supine in bed; L knee flexion AROM 78 degrees sitting edge of bed    General  Comments General comments (skin integrity, edema, etc.): L knee dressings and polar care in place.  Nursing cleared pt for participation in physical therapy.  Pt agreeable to PT session.      Pertinent Vitals/Pain Pain Assessment: 0-10 Pain Score: 0-No pain Pain Location: L knee Pain Intervention(s): Limited activity within patient's tolerance;Monitored during session;Premedicated before session;Repositioned;Other (comment) (polar care applied and activated)  Vitals (HR and O2 on room air) stable and WFL throughout treatment session.    Home Living                      Prior Function            PT Goals (current goals can now be found in the care plan section) Acute Rehab PT Goals Patient Stated Goal: to improve pain control PT Goal Formulation: With patient Time For Goal Achievement: 04/27/20 Potential to Achieve Goals: Good Progress towards PT goals: Progressing toward goals    Frequency    BID      PT Plan Discharge plan needs to be updated    Co-evaluation              AM-PAC PT "6 Clicks" Mobility   Outcome Measure  Help needed turning from your back to your side while in a flat bed without using bedrails?: A Little Help needed moving from lying on your back to sitting on the side of a flat bed without using bedrails?: A Little Help needed moving to and from a bed to a chair (including a wheelchair)?: A Little Help needed standing up from a chair using your arms (e.g., wheelchair or bedside chair)?: A Little Help needed to walk in hospital room?: A Little Help needed climbing 3-5 steps with a railing? : A Lot 6 Click Score: 17    End of Session Equipment Utilized During Treatment: Gait belt Activity Tolerance: Patient tolerated treatment well Patient left: in bed;in CPM;with call bell/phone within reach;with family/visitor present;with SCD's reapplied;Other (comment) (polar care in place and activated) Nurse Communication: Mobility  status;Precautions;Weight bearing status;Other (comment) (pt's nausea (nurse brought pt nausea medication)) PT Visit Diagnosis: Other abnormalities of gait and mobility (R26.89);Muscle weakness (generalized) (M62.81);Difficulty in walking, not elsewhere classified (R26.2);Pain Pain - Right/Left: Left Pain - part of body: Knee     Time: 4403-4742 PT Time Calculation (min) (ACUTE ONLY): 88 min  Charges:  $Gait Training: 8-22 mins $Therapeutic Exercise: 23-37 mins $Therapeutic Activity: 23-37 mins                    Callaway Hailes, PT 04/14/20, 11:09 AM

## 2020-04-15 DIAGNOSIS — M1712 Unilateral primary osteoarthritis, left knee: Secondary | ICD-10-CM | POA: Diagnosis not present

## 2020-04-15 LAB — BASIC METABOLIC PANEL WITH GFR
Anion gap: 8 (ref 5–15)
BUN: 16 mg/dL (ref 8–23)
CO2: 27 mmol/L (ref 22–32)
Calcium: 9 mg/dL (ref 8.9–10.3)
Chloride: 103 mmol/L (ref 98–111)
Creatinine, Ser: 0.81 mg/dL (ref 0.44–1.00)
GFR, Estimated: >60 mL/min
Glucose, Bld: 110 mg/dL — ABNORMAL HIGH (ref 70–99)
Potassium: 4.1 mmol/L (ref 3.5–5.1)
Sodium: 138 mmol/L (ref 135–145)

## 2020-04-15 LAB — CBC
HCT: 29.2 % — ABNORMAL LOW (ref 36.0–46.0)
Hemoglobin: 9.6 g/dL — ABNORMAL LOW (ref 12.0–15.0)
MCH: 29.8 pg (ref 26.0–34.0)
MCHC: 32.9 g/dL (ref 30.0–36.0)
MCV: 90.7 fL (ref 80.0–100.0)
Platelets: 207 10*3/uL (ref 150–400)
RBC: 3.22 MIL/uL — ABNORMAL LOW (ref 3.87–5.11)
RDW: 12.9 % (ref 11.5–15.5)
WBC: 8.5 10*3/uL (ref 4.0–10.5)
nRBC: 0 % (ref 0.0–0.2)

## 2020-04-15 MED ORDER — OXYCODONE HCL 5 MG PO TABS
ORAL_TABLET | ORAL | Status: AC
  Filled 2020-04-15: qty 2

## 2020-04-15 MED ORDER — TRAMADOL HCL 50 MG PO TABS
ORAL_TABLET | ORAL | Status: AC
  Administered 2020-04-15: 50 mg via ORAL
  Filled 2020-04-15: qty 1

## 2020-04-15 MED ORDER — ENOXAPARIN SODIUM 40 MG/0.4ML ~~LOC~~ SOLN
SUBCUTANEOUS | Status: AC
  Administered 2020-04-15: 40 mg via SUBCUTANEOUS
  Filled 2020-04-15: qty 0.4

## 2020-04-15 MED ORDER — ONDANSETRON HCL 4 MG PO TABS
ORAL_TABLET | ORAL | Status: AC
  Filled 2020-04-15: qty 1

## 2020-04-15 MED ORDER — ACETAMINOPHEN 325 MG PO TABS
ORAL_TABLET | ORAL | Status: AC
  Filled 2020-04-15: qty 2

## 2020-04-15 NOTE — Progress Notes (Signed)
PT at bedside.

## 2020-04-15 NOTE — Anesthesia Postprocedure Evaluation (Signed)
Anesthesia Post Note  Patient: Lauren Leblanc  Procedure(s) Performed: TOTAL KNEE ARTHROPLASTY (Left Knee)  Patient location during evaluation: Nursing Unit Anesthesia Type: Spinal Level of consciousness: awake and alert Pain management: pain level controlled Respiratory status: spontaneous breathing Cardiovascular status: blood pressure returned to baseline Postop Assessment: no headache Anesthetic complications: no   No complications documented.   Last Vitals:  Vitals:   04/15/20 0249 04/15/20 0631  BP: (!) 144/69 (!) 146/69  Pulse: 71 66  Resp: 16 18  Temp: 36.6 C   SpO2: 96% 96%    Last Pain:  Vitals:   04/15/20 0631  TempSrc:   PainSc: 7                  [name]

## 2020-04-15 NOTE — Progress Notes (Signed)
Nsg Discharge Note  Admit Date:  04/13/2020 Discharge date: 04/15/2020   Leotis Pain Vaness to be D/C'd Home per MD order.  AVS completed.   Patient/caregiver able to verbalize understanding.  Discharge Medication: Allergies as of 04/15/2020      Reactions   Lisinopril Other (See Comments)   Chest pain, dizziness, cough   Codeine Hives   "hair crawling"   Hydrochlorothiazide Anxiety   Jittery feeling      Medication List    TAKE these medications   amLODipine 2.5 MG tablet Commonly known as: NORVASC Take 2.5 mg by mouth every morning.   apixaban 2.5 MG Tabs tablet Commonly known as: Eliquis Take 1 tablet (2.5 mg total) by mouth 2 (two) times daily for 14 days.   CALCIUM 1200 PO Take 1 tablet by mouth daily.   CULTURELLE DIGESTIVE HEALTH PO Take 1 capsule by mouth daily.   ondansetron 4 MG tablet Commonly known as: ZOFRAN Take 1 tablet (4 mg total) by mouth every 6 (six) hours as needed for nausea.   ONE-A-DAY WOMENS 50 PLUS PO Take 1 tablet by mouth daily.   OVER THE COUNTER MEDICATION Take 2 each by mouth daily. Neuriva   oxyCODONE 5 MG immediate release tablet Commonly known as: Oxy IR/ROXICODONE Take 1-2 tablets (5-10 mg total) by mouth every 4 (four) hours as needed for moderate pain.   pantoprazole 40 MG tablet Commonly known as: PROTONIX Take 1 tablet (40 mg total) by mouth daily. What changed: when to take this   propranolol 20 MG tablet Commonly known as: INDERAL Take 1 tablet (20 mg total) by mouth 2 (two) times daily.   traMADol 50 MG tablet Commonly known as: ULTRAM Take 1-2 tablets (50-100 mg total) by mouth every 6 (six) hours as needed for moderate pain.   Vitamin D (Cholecalciferol) 25 MCG (1000 UT) Tabs Take 1,000 Units by mouth daily.            Durable Medical Equipment  (From admission, onward)         Start     Ordered   04/13/20 1029  DME Bedside commode  Once       Question:  Patient needs a bedside commode to treat  with the following condition  Answer:  Status post total knee replacement using cement, left   04/13/20 1028   04/13/20 1029  DME 3 n 1  Once        04/13/20 1028   04/13/20 1029  DME Walker rolling  Once       Question Answer Comment  Walker: With 5 Inch Wheels   Patient needs a walker to treat with the following condition Status post total knee replacement using cement, left      04/13/20 1028          Discharge Assessment: Vitals:   04/15/20 1158 04/15/20 1338  BP: (!) 151/65 (!) 155/67  Pulse: 66 66  Resp:  18  Temp:  (!) 97 F (36.1 C)  SpO2:  97%   Skin clean, dry and intact without evidence of skin break down, no evidence of skin tears noted. IV catheter discontinued intact. Site without signs and symptoms of complications - no redness or edema noted at insertion site, patient denies c/o pain - only slight tenderness at site.  Dressing with slight pressure applied.  D/c Instructions-Education: Discharge instructions given to patient/family with verbalized understanding. D/c education completed with patient/family including follow up instructions, medication list, d/c activities limitations if indicated,  with other d/c instructions as indicated by MD - patient able to verbalize understanding, all questions fully answered. Patient instructed to return to ED, call 911, or call MD for any changes in condition.  Patient escorted via WC, and D/C home via private auto.  Theodore Demark, RN 04/15/2020 2:34 PM

## 2020-04-15 NOTE — Progress Notes (Signed)
Physical Therapy Treatment Patient Details Name: Lauren Leblanc MRN: [id number] DOB: 1950/12/25 Today's Date: 04/15/2020    History of Present Illness Pt is a 69 y.o. female s/p L TKA secondary DJD L knee 04/13/20.  PMH includes htn, angina at rest, syncope, h/o back surgery, acute costochrondritis 04/24/17.    PT Comments    Pt resting in recliner upon PT arrival; agreeable to PT session.  6/10 L knee pain beginning of session at rest and 8/10 end of session at rest (nurse notified of pt's request for pain meds and polar care applied for pain relief).  CGA to SBA with transfers using RW (pt needing to toilet during session) and CGA to SBA ambulating 150 feet with RW.  Initially trialed 1 regular step with B railings but deferred further attempts d/t L knee pain and pt reporting her step to enter home was shorter (pt has a ramp up to home prior to that step).  Able to navigate 2 inch platform step (x2 trials) with RW (using forearms instead of hands on walker) with CGA x2.  Pt nauseas towards end of therapy session (nurse notified).  Therapist answered pt's and pt's daughters questions regarding how to safely navigate ramp, HEP, assist levels required for safe home discharge, and importance of L knee ROM (flexion and extension).  Will continue to focus on strengthening, ROM, and progressive functional mobility per pt tolerance during hospitalization.  Pt's daughter reports she and family will be able to provide required assistance at home for pt's safe discharge.  Therapist offered afternoon therapy session to address any concerns prior to hospital discharge but message received from pt's nurse that pt felt good and did not want to practice anything else (with physical therapy) prior to hospital discharge today.    Follow Up Recommendations  Home health PT;Supervision/Assistance - 24 hour     Equipment Recommendations  Rolling walker with 5" wheels;3in1 (PT)    Recommendations for Other  Services OT consult     Precautions / Restrictions Precautions Precautions: Fall;Knee Precaution Booklet Issued: Yes (comment) Restrictions Weight Bearing Restrictions: Yes LLE Weight Bearing: Weight bearing as tolerated    Mobility  Bed Mobility               General bed mobility comments: Deferred (pt sitting in recliner beginning/end of session)  Transfers Overall transfer level: Needs assistance Equipment used: Rolling walker (2 wheeled) Transfers: Sit to/from Stand Sit to Stand: Supervision;Min guard         General transfer comment: x4 trials from recliner with RW use; x1 trial from toilet with use of grab bar  Ambulation/Gait Ambulation/Gait assistance: Supervision;Min guard Gait Distance (Feet): 150 Feet Assistive device: Rolling walker (2 wheeled) Gait Pattern/deviations: Step-to pattern;Antalgic Gait velocity: decreased   General Gait Details: antalgic; decreased stance time L LE; occasional vc's for upright posture with ambulation   Stairs Stairs: Yes     Number of Stairs:  (1 regular step with B railings; 1 small 2 inch platform step (x2 trials with RW)) General stair comments: 1 regular step with B railings (ascended forwards and descended backwards with min assist x2)--pt reporting B railings were too far away and steps were painful for L knee so deferred further attempts at regular steps; pt then reporting her step was shorter at home so brought short platform step to trial; 1 small 2 inch platform step (x2 trials with RW with CGA x2)--pt using B forearms on walker for support instead of hands; initial vc's for  LE sequencing and overall technique; pt's daughter able to provide appropriate cueing   Wheelchair Mobility    Modified Rankin (Stroke Patients Only)       Balance Overall balance assessment: Needs assistance Sitting-balance support: No upper extremity supported;Feet supported Sitting balance-Leahy Scale: Good Sitting balance -  Comments: steady sitting reaching within BOS   Standing balance support: Single extremity supported Standing balance-Leahy Scale: Fair Standing balance comment: steady with at least single UE support in standing                            Cognition Arousal/Alertness: Awake/alert Behavior During Therapy: WFL for tasks assessed/performed Overall Cognitive Status: Within Functional Limits for tasks assessed                                 General Comments: A&O x4.  Increased time to process at times (anticipate d/t pain)      Exercises Total Joint Exercises Heel Slides: AAROM;Strengthening;Left;10 reps;Supine Goniometric ROM: L knee extension 8 degrees short of neutral semi-supine in bed; L knee flexion AROM 80 degrees in sitting (limited d/t L knee pain)    General Comments General comments (skin integrity, edema, etc.): L knee dressings in place.  Nursing cleared pt for participation in physical therapy.  Pt agreeable to PT session.  Pt's daughter present part of therapy session.      Pertinent Vitals/Pain Pain Assessment: 0-10 Pain Score: 8  Pain Location: L knee Pain Descriptors / Indicators: Aching;Sore;Constant;Discomfort;Grimacing;Guarding;Operative site guarding;Tender Pain Intervention(s): Limited activity within patient's tolerance;Premedicated before session;Repositioned;Patient requesting pain meds-RN notified;Other (comment) (polar care applied and activated)  Vitals (HR and O2 on room air) stable and WFL throughout treatment session.    Home Living                      Prior Function            PT Goals (current goals can now be found in the care plan section) Acute Rehab PT Goals Patient Stated Goal: to be safe PT Goal Formulation: With patient Time For Goal Achievement: 04/27/20 Potential to Achieve Goals: Good Progress towards PT goals: Progressing toward goals    Frequency    BID      PT Plan Current plan remains  appropriate    Co-evaluation              AM-PAC PT "6 Clicks" Mobility   Outcome Measure  Help needed turning from your back to your side while in a flat bed without using bedrails?: A Little Help needed moving from lying on your back to sitting on the side of a flat bed without using bedrails?: A Little Help needed moving to and from a bed to a chair (including a wheelchair)?: A Little Help needed standing up from a chair using your arms (e.g., wheelchair or bedside chair)?: A Little Help needed to walk in hospital room?: A Little Help needed climbing 3-5 steps with a railing? : A Little 6 Click Score: 18    End of Session Equipment Utilized During Treatment: Gait belt Activity Tolerance: Patient limited by pain;Other (comment) (Limited d/t nausea) Patient left: in chair;with call bell/phone within reach;with family/visitor present;with SCD's reapplied;Other (comment) (pt unable to tolerate towel roll under L heel so positioned L LE/knee as straight as pt could tolerate; polar care in place and activated) Nurse Communication:  Mobility status;Precautions;Patient requests pain meds;Weight bearing status;Other (comment) (pt's nausea) PT Visit Diagnosis: Other abnormalities of gait and mobility (R26.89);Muscle weakness (generalized) (M62.81);Difficulty in walking, not elsewhere classified (R26.2);Pain Pain - Right/Left: Left Pain - part of body: Knee     Time: 1031-2811 PT Time Calculation (min) (ACUTE ONLY): 85 min  Charges:  $Gait Training: 23-37 mins $Therapeutic Exercise: 8-22 mins $Therapeutic Activity: 23-37 mins                    Hendricks Limes, PT 04/15/20, 1:05 PM

## 2020-04-15 NOTE — Progress Notes (Signed)
  Subjective: 2 Days Post-Op Procedure(s) (LRB): TOTAL KNEE ARTHROPLASTY (Left) Patient reports pain as mild at the moment.   Patient is well, and has had no acute complaints or problems Plan is to go Home after hospital stay. Negative for chest pain and shortness of breath Fever: no Gastrointestinal:Negative for nausea and vomiting this morning, she states her nausea has improved.  Objective: Vital signs in last 24 hours: Temp:  [97.9 F (36.6 C)-98 F (36.7 C)] 97.9 F (36.6 C) (12/23 0249) Pulse Rate:  [63-71] 66 (12/23 0631) Resp:  [16-18] 18 (12/23 0631) BP: (130-146)/(68-72) 146/69 (12/23 0631) SpO2:  [95 %-96 %] 96 % (12/23 0631)  Intake/Output from previous day:  Intake/Output Summary (Last 24 hours) at 04/15/2020 0758 Last data filed at 04/15/2020 0634 Gross per 24 hour  Intake 426.36 ml  Output --  Net 426.36 ml    Intake/Output this shift: No intake/output data recorded.  Labs: Recent Labs    04/14/20 0627 04/15/20 0621  HGB 10.3* 9.6*   Recent Labs    04/14/20 0627 04/15/20 0621  WBC 8.8 8.5  RBC 3.43* 3.22*  HCT 30.8* 29.2*  PLT 215 207   Recent Labs    04/14/20 0627 04/15/20 0621  NA 134* 138  K 3.8 4.1  CL 101 103  CO2 22 27  BUN 19 16  CREATININE 0.81 0.81  GLUCOSE 110* 110*  CALCIUM 8.3* 9.0   No results for input(s): LABPT, INR in the last 72 hours.  EXAM General - Patient is Alert, Appropriate and Oriented Extremity - ABD soft Sensation intact distally Intact pulses distally Dorsiflexion/Plantar flexion intact Incision: scant drainage No cellulitis present Dressing/Incision - blood tinged drainage Motor Function - intact, moving foot and toes well on exam.  Abdomen soft with normal bowel sounds this AM. Negative Homans to bilateral legs.  Past Medical History:  Diagnosis Date  . Angina at rest [facility])   . Arthritis   . GERD (gastroesophageal reflux disease)   . Hypertension     Assessment/Plan: 2 Days Post-Op  Procedure(s) (LRB): TOTAL KNEE ARTHROPLASTY (Left) Active Problems:   Status post total knee replacement using cement, left  Estimated body mass index is 34.23 kg/m as calculated from the following:   Height as of this encounter: 5\' 5"  (1.651 m).   Weight as of this encounter: 93.3 kg. Advance diet Up with therapy D/C IV fluids when tolerating po intake.  Pain is improved. Continue with PT, plan for discharge home today with HHPT. Patient states that she is passing gas without pain. Will transition to Eliquis for DVT prevention upon discharge.  DVT Prophylaxis - Lovenox, Foot Pumps and TED hose Weight-Bearing as tolerated to left leg  J. , PA-C [facility] 04/15/2020, 7:58 AM

## 2020-04-18 DIAGNOSIS — I10 Essential (primary) hypertension: Secondary | ICD-10-CM | POA: Diagnosis not present

## 2020-04-18 DIAGNOSIS — K219 Gastro-esophageal reflux disease without esophagitis: Secondary | ICD-10-CM | POA: Diagnosis not present

## 2020-04-18 DIAGNOSIS — Z7901 Long term (current) use of anticoagulants: Secondary | ICD-10-CM | POA: Diagnosis not present

## 2020-04-18 DIAGNOSIS — E782 Mixed hyperlipidemia: Secondary | ICD-10-CM | POA: Diagnosis not present

## 2020-04-18 DIAGNOSIS — Z96652 Presence of left artificial knee joint: Secondary | ICD-10-CM | POA: Diagnosis not present

## 2020-04-18 DIAGNOSIS — Z471 Aftercare following joint replacement surgery: Secondary | ICD-10-CM | POA: Diagnosis not present

## 2020-04-19 DIAGNOSIS — Z471 Aftercare following joint replacement surgery: Secondary | ICD-10-CM | POA: Diagnosis not present

## 2020-04-20 DIAGNOSIS — Z96652 Presence of left artificial knee joint: Secondary | ICD-10-CM | POA: Diagnosis not present

## 2020-04-20 DIAGNOSIS — Z471 Aftercare following joint replacement surgery: Secondary | ICD-10-CM | POA: Diagnosis not present

## 2020-04-20 DIAGNOSIS — Z7901 Long term (current) use of anticoagulants: Secondary | ICD-10-CM | POA: Diagnosis not present

## 2020-04-20 DIAGNOSIS — E782 Mixed hyperlipidemia: Secondary | ICD-10-CM | POA: Diagnosis not present

## 2020-04-20 DIAGNOSIS — I10 Essential (primary) hypertension: Secondary | ICD-10-CM | POA: Diagnosis not present

## 2020-04-20 DIAGNOSIS — K219 Gastro-esophageal reflux disease without esophagitis: Secondary | ICD-10-CM | POA: Diagnosis not present

## 2020-04-22 DIAGNOSIS — Z471 Aftercare following joint replacement surgery: Secondary | ICD-10-CM | POA: Diagnosis not present

## 2020-04-22 DIAGNOSIS — K219 Gastro-esophageal reflux disease without esophagitis: Secondary | ICD-10-CM | POA: Diagnosis not present

## 2020-04-22 DIAGNOSIS — Z7901 Long term (current) use of anticoagulants: Secondary | ICD-10-CM | POA: Diagnosis not present

## 2020-04-22 DIAGNOSIS — I10 Essential (primary) hypertension: Secondary | ICD-10-CM | POA: Diagnosis not present

## 2020-04-22 DIAGNOSIS — E782 Mixed hyperlipidemia: Secondary | ICD-10-CM | POA: Diagnosis not present

## 2020-04-22 DIAGNOSIS — Z96652 Presence of left artificial knee joint: Secondary | ICD-10-CM | POA: Diagnosis not present

## 2020-04-27 DIAGNOSIS — Z96652 Presence of left artificial knee joint: Secondary | ICD-10-CM | POA: Diagnosis not present

## 2020-04-27 DIAGNOSIS — M25562 Pain in left knee: Secondary | ICD-10-CM | POA: Diagnosis not present

## 2020-04-27 DIAGNOSIS — M25662 Stiffness of left knee, not elsewhere classified: Secondary | ICD-10-CM | POA: Diagnosis not present

## 2020-04-27 DIAGNOSIS — M6281 Muscle weakness (generalized): Secondary | ICD-10-CM | POA: Diagnosis not present

## 2020-04-28 ENCOUNTER — Ambulatory Visit: Payer: Medicare HMO | Admitting: Family Medicine

## 2020-04-28 DIAGNOSIS — M6281 Muscle weakness (generalized): Secondary | ICD-10-CM | POA: Diagnosis not present

## 2020-04-28 DIAGNOSIS — M25662 Stiffness of left knee, not elsewhere classified: Secondary | ICD-10-CM | POA: Diagnosis not present

## 2020-04-28 DIAGNOSIS — M25562 Pain in left knee: Secondary | ICD-10-CM | POA: Diagnosis not present

## 2020-04-28 DIAGNOSIS — Z96652 Presence of left artificial knee joint: Secondary | ICD-10-CM | POA: Diagnosis not present

## 2020-05-06 DIAGNOSIS — Z96652 Presence of left artificial knee joint: Secondary | ICD-10-CM | POA: Diagnosis not present

## 2020-05-06 DIAGNOSIS — M6281 Muscle weakness (generalized): Secondary | ICD-10-CM | POA: Diagnosis not present

## 2020-05-06 DIAGNOSIS — M25662 Stiffness of left knee, not elsewhere classified: Secondary | ICD-10-CM | POA: Diagnosis not present

## 2020-05-06 DIAGNOSIS — M25562 Pain in left knee: Secondary | ICD-10-CM | POA: Diagnosis not present

## 2020-05-07 DIAGNOSIS — Z96652 Presence of left artificial knee joint: Secondary | ICD-10-CM | POA: Diagnosis not present

## 2020-05-07 DIAGNOSIS — M25562 Pain in left knee: Secondary | ICD-10-CM | POA: Diagnosis not present

## 2020-05-07 DIAGNOSIS — M25662 Stiffness of left knee, not elsewhere classified: Secondary | ICD-10-CM | POA: Diagnosis not present

## 2020-05-07 DIAGNOSIS — M6281 Muscle weakness (generalized): Secondary | ICD-10-CM | POA: Diagnosis not present

## 2020-05-12 DIAGNOSIS — M6281 Muscle weakness (generalized): Secondary | ICD-10-CM | POA: Diagnosis not present

## 2020-05-12 DIAGNOSIS — M25662 Stiffness of left knee, not elsewhere classified: Secondary | ICD-10-CM | POA: Diagnosis not present

## 2020-05-12 DIAGNOSIS — M25562 Pain in left knee: Secondary | ICD-10-CM | POA: Diagnosis not present

## 2020-05-12 DIAGNOSIS — Z96652 Presence of left artificial knee joint: Secondary | ICD-10-CM | POA: Diagnosis not present

## 2020-05-17 DIAGNOSIS — Z96652 Presence of left artificial knee joint: Secondary | ICD-10-CM | POA: Diagnosis not present

## 2020-05-17 DIAGNOSIS — M25662 Stiffness of left knee, not elsewhere classified: Secondary | ICD-10-CM | POA: Diagnosis not present

## 2020-05-17 DIAGNOSIS — M25562 Pain in left knee: Secondary | ICD-10-CM | POA: Diagnosis not present

## 2020-05-17 DIAGNOSIS — M6281 Muscle weakness (generalized): Secondary | ICD-10-CM | POA: Diagnosis not present

## 2020-05-19 DIAGNOSIS — Z96652 Presence of left artificial knee joint: Secondary | ICD-10-CM | POA: Diagnosis not present

## 2020-05-19 DIAGNOSIS — M25562 Pain in left knee: Secondary | ICD-10-CM | POA: Diagnosis not present

## 2020-05-19 DIAGNOSIS — M6281 Muscle weakness (generalized): Secondary | ICD-10-CM | POA: Diagnosis not present

## 2020-05-19 DIAGNOSIS — M25662 Stiffness of left knee, not elsewhere classified: Secondary | ICD-10-CM | POA: Diagnosis not present

## 2020-05-24 DIAGNOSIS — M25662 Stiffness of left knee, not elsewhere classified: Secondary | ICD-10-CM | POA: Diagnosis not present

## 2020-05-24 DIAGNOSIS — Z96652 Presence of left artificial knee joint: Secondary | ICD-10-CM | POA: Diagnosis not present

## 2020-05-24 DIAGNOSIS — M25562 Pain in left knee: Secondary | ICD-10-CM | POA: Diagnosis not present

## 2020-05-24 DIAGNOSIS — M6281 Muscle weakness (generalized): Secondary | ICD-10-CM | POA: Diagnosis not present

## 2020-05-26 DIAGNOSIS — Z96652 Presence of left artificial knee joint: Secondary | ICD-10-CM | POA: Diagnosis not present

## 2020-05-26 DIAGNOSIS — M25662 Stiffness of left knee, not elsewhere classified: Secondary | ICD-10-CM | POA: Diagnosis not present

## 2020-05-26 DIAGNOSIS — M6281 Muscle weakness (generalized): Secondary | ICD-10-CM | POA: Diagnosis not present

## 2020-05-26 DIAGNOSIS — M25562 Pain in left knee: Secondary | ICD-10-CM | POA: Diagnosis not present

## 2020-06-01 DIAGNOSIS — M25562 Pain in left knee: Secondary | ICD-10-CM | POA: Diagnosis not present

## 2020-06-01 DIAGNOSIS — M25662 Stiffness of left knee, not elsewhere classified: Secondary | ICD-10-CM | POA: Diagnosis not present

## 2020-06-01 DIAGNOSIS — M6281 Muscle weakness (generalized): Secondary | ICD-10-CM | POA: Diagnosis not present

## 2020-06-01 DIAGNOSIS — Z96652 Presence of left artificial knee joint: Secondary | ICD-10-CM | POA: Diagnosis not present

## 2020-06-03 DIAGNOSIS — M25662 Stiffness of left knee, not elsewhere classified: Secondary | ICD-10-CM | POA: Diagnosis not present

## 2020-06-03 DIAGNOSIS — M25562 Pain in left knee: Secondary | ICD-10-CM | POA: Diagnosis not present

## 2020-06-03 DIAGNOSIS — M6281 Muscle weakness (generalized): Secondary | ICD-10-CM | POA: Diagnosis not present

## 2020-06-03 DIAGNOSIS — Z96652 Presence of left artificial knee joint: Secondary | ICD-10-CM | POA: Diagnosis not present

## 2020-06-08 DIAGNOSIS — Z96652 Presence of left artificial knee joint: Secondary | ICD-10-CM | POA: Diagnosis not present

## 2020-06-08 DIAGNOSIS — M25662 Stiffness of left knee, not elsewhere classified: Secondary | ICD-10-CM | POA: Diagnosis not present

## 2020-06-08 DIAGNOSIS — M6281 Muscle weakness (generalized): Secondary | ICD-10-CM | POA: Diagnosis not present

## 2020-06-08 DIAGNOSIS — M25562 Pain in left knee: Secondary | ICD-10-CM | POA: Diagnosis not present

## 2020-06-10 DIAGNOSIS — Z96652 Presence of left artificial knee joint: Secondary | ICD-10-CM | POA: Diagnosis not present

## 2020-06-10 DIAGNOSIS — M25562 Pain in left knee: Secondary | ICD-10-CM | POA: Diagnosis not present

## 2020-06-10 DIAGNOSIS — M6281 Muscle weakness (generalized): Secondary | ICD-10-CM | POA: Diagnosis not present

## 2020-06-10 DIAGNOSIS — M25662 Stiffness of left knee, not elsewhere classified: Secondary | ICD-10-CM | POA: Diagnosis not present

## 2020-06-15 DIAGNOSIS — M6281 Muscle weakness (generalized): Secondary | ICD-10-CM | POA: Diagnosis not present

## 2020-06-15 DIAGNOSIS — Z96652 Presence of left artificial knee joint: Secondary | ICD-10-CM | POA: Diagnosis not present

## 2020-06-15 DIAGNOSIS — M25662 Stiffness of left knee, not elsewhere classified: Secondary | ICD-10-CM | POA: Diagnosis not present

## 2020-06-15 DIAGNOSIS — M25562 Pain in left knee: Secondary | ICD-10-CM | POA: Diagnosis not present

## 2020-06-17 DIAGNOSIS — Z96652 Presence of left artificial knee joint: Secondary | ICD-10-CM | POA: Diagnosis not present

## 2020-06-17 DIAGNOSIS — M6281 Muscle weakness (generalized): Secondary | ICD-10-CM | POA: Diagnosis not present

## 2020-06-17 DIAGNOSIS — M25562 Pain in left knee: Secondary | ICD-10-CM | POA: Diagnosis not present

## 2020-06-17 DIAGNOSIS — M25662 Stiffness of left knee, not elsewhere classified: Secondary | ICD-10-CM | POA: Diagnosis not present

## 2020-06-18 DIAGNOSIS — M47816 Spondylosis without myelopathy or radiculopathy, lumbar region: Secondary | ICD-10-CM | POA: Diagnosis not present

## 2020-06-18 DIAGNOSIS — M461 Sacroiliitis, not elsewhere classified: Secondary | ICD-10-CM | POA: Insufficient documentation

## 2020-06-18 DIAGNOSIS — M4316 Spondylolisthesis, lumbar region: Secondary | ICD-10-CM | POA: Insufficient documentation

## 2020-06-18 DIAGNOSIS — M533 Sacrococcygeal disorders, not elsewhere classified: Secondary | ICD-10-CM | POA: Diagnosis not present

## 2020-06-18 DIAGNOSIS — M545 Low back pain, unspecified: Secondary | ICD-10-CM | POA: Diagnosis not present

## 2020-06-24 DIAGNOSIS — Z96652 Presence of left artificial knee joint: Secondary | ICD-10-CM | POA: Diagnosis not present

## 2020-06-24 DIAGNOSIS — M6281 Muscle weakness (generalized): Secondary | ICD-10-CM | POA: Diagnosis not present

## 2020-06-24 DIAGNOSIS — M25562 Pain in left knee: Secondary | ICD-10-CM | POA: Diagnosis not present

## 2020-06-24 DIAGNOSIS — M25662 Stiffness of left knee, not elsewhere classified: Secondary | ICD-10-CM | POA: Diagnosis not present

## 2020-06-29 DIAGNOSIS — M25562 Pain in left knee: Secondary | ICD-10-CM | POA: Diagnosis not present

## 2020-06-29 DIAGNOSIS — Z96652 Presence of left artificial knee joint: Secondary | ICD-10-CM | POA: Diagnosis not present

## 2020-06-29 DIAGNOSIS — M6281 Muscle weakness (generalized): Secondary | ICD-10-CM | POA: Diagnosis not present

## 2020-06-29 DIAGNOSIS — M25662 Stiffness of left knee, not elsewhere classified: Secondary | ICD-10-CM | POA: Diagnosis not present

## 2020-07-01 DIAGNOSIS — M6281 Muscle weakness (generalized): Secondary | ICD-10-CM | POA: Diagnosis not present

## 2020-07-01 DIAGNOSIS — M25562 Pain in left knee: Secondary | ICD-10-CM | POA: Diagnosis not present

## 2020-07-01 DIAGNOSIS — Z96652 Presence of left artificial knee joint: Secondary | ICD-10-CM | POA: Diagnosis not present

## 2020-07-01 DIAGNOSIS — M25662 Stiffness of left knee, not elsewhere classified: Secondary | ICD-10-CM | POA: Diagnosis not present

## 2020-07-05 DIAGNOSIS — M6281 Muscle weakness (generalized): Secondary | ICD-10-CM | POA: Diagnosis not present

## 2020-07-05 DIAGNOSIS — M25562 Pain in left knee: Secondary | ICD-10-CM | POA: Diagnosis not present

## 2020-07-05 DIAGNOSIS — M25662 Stiffness of left knee, not elsewhere classified: Secondary | ICD-10-CM | POA: Diagnosis not present

## 2020-07-05 DIAGNOSIS — Z96652 Presence of left artificial knee joint: Secondary | ICD-10-CM | POA: Diagnosis not present

## 2020-07-07 DIAGNOSIS — M6281 Muscle weakness (generalized): Secondary | ICD-10-CM | POA: Diagnosis not present

## 2020-07-07 DIAGNOSIS — M25562 Pain in left knee: Secondary | ICD-10-CM | POA: Diagnosis not present

## 2020-07-07 DIAGNOSIS — Z96652 Presence of left artificial knee joint: Secondary | ICD-10-CM | POA: Diagnosis not present

## 2020-07-07 DIAGNOSIS — M25662 Stiffness of left knee, not elsewhere classified: Secondary | ICD-10-CM | POA: Diagnosis not present

## 2020-07-13 DIAGNOSIS — M6281 Muscle weakness (generalized): Secondary | ICD-10-CM | POA: Diagnosis not present

## 2020-07-13 DIAGNOSIS — M25562 Pain in left knee: Secondary | ICD-10-CM | POA: Diagnosis not present

## 2020-07-13 DIAGNOSIS — Z96652 Presence of left artificial knee joint: Secondary | ICD-10-CM | POA: Diagnosis not present

## 2020-07-13 DIAGNOSIS — M25662 Stiffness of left knee, not elsewhere classified: Secondary | ICD-10-CM | POA: Diagnosis not present

## 2020-07-15 DIAGNOSIS — M25662 Stiffness of left knee, not elsewhere classified: Secondary | ICD-10-CM | POA: Diagnosis not present

## 2020-07-15 DIAGNOSIS — M6281 Muscle weakness (generalized): Secondary | ICD-10-CM | POA: Diagnosis not present

## 2020-07-15 DIAGNOSIS — Z96652 Presence of left artificial knee joint: Secondary | ICD-10-CM | POA: Diagnosis not present

## 2020-07-15 DIAGNOSIS — M25562 Pain in left knee: Secondary | ICD-10-CM | POA: Diagnosis not present

## 2020-07-20 DIAGNOSIS — M6281 Muscle weakness (generalized): Secondary | ICD-10-CM | POA: Diagnosis not present

## 2020-07-20 DIAGNOSIS — Z96652 Presence of left artificial knee joint: Secondary | ICD-10-CM | POA: Diagnosis not present

## 2020-07-20 DIAGNOSIS — M25562 Pain in left knee: Secondary | ICD-10-CM | POA: Diagnosis not present

## 2020-07-20 DIAGNOSIS — M25662 Stiffness of left knee, not elsewhere classified: Secondary | ICD-10-CM | POA: Diagnosis not present

## 2020-07-21 ENCOUNTER — Other Ambulatory Visit: Payer: Self-pay

## 2020-07-21 ENCOUNTER — Ambulatory Visit (INDEPENDENT_AMBULATORY_CARE_PROVIDER_SITE_OTHER): Payer: Medicare HMO

## 2020-07-21 VITALS — BP 132/82 | HR 70 | Ht 65.0 in | Wt 202.0 lb

## 2020-07-21 DIAGNOSIS — Z0000 Encounter for general adult medical examination without abnormal findings: Secondary | ICD-10-CM | POA: Diagnosis not present

## 2020-07-21 DIAGNOSIS — Z1231 Encounter for screening mammogram for malignant neoplasm of breast: Secondary | ICD-10-CM

## 2020-07-21 DIAGNOSIS — Z23 Encounter for immunization: Secondary | ICD-10-CM

## 2020-07-21 NOTE — Patient Instructions (Signed)
[name] , Thank you for taking time to come for your Medicare Wellness Visit. I appreciate your ongoing commitment to your health goals. Please review the following plan we discussed and let me know if I can assist you in the future.   Screening recommendations/referrals: Colonoscopy: Done 07/29/17 - Repeat in 10 years Mammogram: Done 07/22/19 - Ordered repeat today Bone Density: Done 09/02/19 - repeat 2 years Recommended yearly ophthalmology/optometry visit for glaucoma screening and checkup Recommended yearly dental visit for hygiene and checkup  Vaccinations: Influenza vaccine: Done 02/06/20 Pneumococcal vaccine: Done today in office Tdap vaccine: 12/06/2018 - repeat 10 years Shingles vaccine: Due Shingrix discussed. Please contact your pharmacy for coverage information.    Covid-19:Complete: 06/15/19, 07/09/19, & 01/22/20  Advanced directives: Advance directive discussed with you today. I have provided a copy for you to complete at home and have notarized. Once this is complete please bring a copy in to our office so we can scan it into your chart.  Conditions/risks identified: Continue working on strengthening knees/legs  Next appointment: Follow up in one year for your annual wellness visit    Preventive Care 65 Years and Older, Female Preventive care refers to lifestyle choices and visits with your health care provider that can promote health and wellness. What does preventive care include?  A yearly physical exam. This is also called an annual well check.  Dental exams once or twice a year.  Routine eye exams. Ask your health care provider how often you should have your eyes checked.  Personal lifestyle choices, including:  Daily care of your teeth and gums.  Regular physical activity.  Eating a healthy diet.  Avoiding tobacco and drug use.  Limiting alcohol use.  Practicing safe sex.  Taking low-dose aspirin every day.  Taking vitamin and mineral  supplements as recommended by your health care provider. What happens during an annual well check? The services and screenings done by your health care provider during your annual well check will depend on your age, overall health, lifestyle risk factors, and family history of disease. Counseling  Your health care provider may ask you questions about your:  Alcohol use.  Tobacco use.  Drug use.  Emotional well-being.  Home and relationship well-being.  Sexual activity.  Eating habits.  History of falls.  Memory and ability to understand (cognition).  Work and work Astronomer.  Reproductive health. Screening  You may have the following tests or measurements:  Height, weight, and BMI.  Blood pressure.  Lipid and cholesterol levels. These may be checked every 5 years, or more frequently if you are over 59 years old.  Skin check.  Lung cancer screening. You may have this screening every year starting at age 52 if you have a 30-pack-year history of smoking and currently smoke or have quit within the past 15 years.  Fecal occult blood test (FOBT) of the stool. You may have this test every year starting at age 70.  Flexible sigmoidoscopy or colonoscopy. You may have a sigmoidoscopy every 5 years or a colonoscopy every 10 years starting at age 56.  Hepatitis C blood test.  Hepatitis B blood test.  Sexually transmitted disease (STD) testing.  Diabetes screening. This is done by checking your blood sugar (glucose) after you have not eaten for a while (fasting). You may have this done every 1-3 years.  Bone density scan. This is done to screen for osteoporosis. You may have this done starting at age 37.  Mammogram. This may be done  every 1-2 years. Talk to your health care provider about how often you should have regular mammograms. Talk with your health care provider about your test results, treatment options, and if necessary, the need for more tests. Vaccines  Your  health care provider may recommend certain vaccines, such as:  Influenza vaccine. This is recommended every year.  Tetanus, diphtheria, and acellular pertussis (Tdap, Td) vaccine. You may need a Td booster every 10 years.  Zoster vaccine. You may need this after age 62.  Pneumococcal 13-valent conjugate (PCV13) vaccine. One dose is recommended after age 35.  Pneumococcal polysaccharide (PPSV23) vaccine. One dose is recommended after age 72. Talk to your health care provider about which screenings and vaccines you need and how often you need them. This information is not intended to replace advice given to you by your health care provider. Make sure you discuss any questions you have with your health care provider. Document Released: 05/07/2015 Document Revised: 12/29/2015 Document Reviewed: 02/09/2015 Elsevier Interactive Patient Education  2017 [employer] Prevention in the Home Falls can cause injuries. They can happen to people of all ages. There are many things you can do to make your home safe and to help prevent falls. What can I do on the outside of my home?  Regularly fix the edges of walkways and driveways and fix any cracks.  Remove anything that might make you trip as you walk through a door, such as a raised step or threshold.  Trim any bushes or trees on the path to your home.  Use bright outdoor lighting.  Clear any walking paths of anything that might make someone trip, such as rocks or tools.  Regularly check to see if handrails are loose or broken. Make sure that both sides of any steps have handrails.  Any raised decks and porches should have guardrails on the edges.  Have any leaves, snow, or ice cleared regularly.  Use sand or salt on walking paths during winter.  Clean up any spills in your garage right away. This includes oil or grease spills. What can I do in the bathroom?  Use night lights.  Install grab bars by the toilet and in the tub and  shower. Do not use towel bars as grab bars.  Use non-skid mats or decals in the tub or shower.  If you need to sit down in the shower, use a plastic, non-slip stool.  Keep the floor dry. Clean up any water that spills on the floor as soon as it happens.  Remove soap buildup in the tub or shower regularly.  Attach bath mats securely with double-sided non-slip rug tape.  Do not have throw rugs and other things on the floor that can make you trip. What can I do in the bedroom?  Use night lights.  Make sure that you have a light by your bed that is easy to reach.  Do not use any sheets or blankets that are too big for your bed. They should not hang down onto the floor.  Have a firm chair that has side arms. You can use this for support while you get dressed.  Do not have throw rugs and other things on the floor that can make you trip. What can I do in the kitchen?  Clean up any spills right away.  Avoid walking on wet floors.  Keep items that you use a lot in easy-to-reach places.  If you need to reach something above you, use a  strong step stool that has a grab bar.  Keep electrical cords out of the way.  Do not use floor polish or wax that makes floors slippery. If you must use wax, use non-skid floor wax.  Do not have throw rugs and other things on the floor that can make you trip. What can I do with my stairs?  Do not leave any items on the stairs.  Make sure that there are handrails on both sides of the stairs and use them. Fix handrails that are broken or loose. Make sure that handrails are as long as the stairways.  Check any carpeting to make sure that it is firmly attached to the stairs. Fix any carpet that is loose or worn.  Avoid having throw rugs at the top or bottom of the stairs. If you do have throw rugs, attach them to the floor with carpet tape.  Make sure that you have a light switch at the top of the stairs and the bottom of the stairs. If you do not  have them, ask someone to add them for you. What else can I do to help prevent falls?  Wear shoes that:  Do not have high heels.  Have rubber bottoms.  Are comfortable and fit you well.  Are closed at the toe. Do not wear sandals.  If you use a stepladder:  Make sure that it is fully opened. Do not climb a closed stepladder.  Make sure that both sides of the stepladder are locked into place.  Ask someone to hold it for you, if possible.  Clearly mark and make sure that you can see:  Any grab bars or handrails.  First and last steps.  Where the edge of each step is.  Use tools that help you move around (mobility aids) if they are needed. These include:  Canes.  Walkers.  Scooters.  Crutches.  Turn on the lights when you go into a dark area. Replace any light bulbs as soon as they burn out.  Set up your furniture so you have a clear path. Avoid moving your furniture around.  If any of your floors are uneven, fix them.  If there are any pets around you, be aware of where they are.  Review your medicines with your doctor. Some medicines can make you feel dizzy. This can increase your chance of falling. Ask your doctor what other things that you can do to help prevent falls. This information is not intended to replace advice given to you by your health care provider. Make sure you discuss any questions you have with your health care provider. Document Released: 02/04/2009 Document Revised: 09/16/2015 Document Reviewed: 05/15/2014 Elsevier Interactive Patient Education  2017 [employer].

## 2020-07-21 NOTE — Progress Notes (Signed)
Subjective:   Lauren Leblanc is a 70 y.o. female who presents for Medicare Annual (Subsequent) preventive examination.  Review of Systems     Cardiac Risk Factors include: advanced age (>91men, >64 women)     Objective:    Today's Vitals   07/21/20 0922 07/21/20 0925  BP: 132/82   Pulse: 70   SpO2: 99%   Weight: 202 lb (91.6 kg)   Height: 5\' 5"  (1.651 m)   PainSc:  2    Body mass index is 33.61 kg/m.  Advanced Directives 07/21/2020 04/13/2020 04/02/2020 07/16/2019 12/11/2018 06/27/2017 04/24/2017  Does Patient Have a Medical Advance Directive? No No No No No No No  Would patient like information on creating a medical advance directive? Yes (MAU/Ambulatory/Procedural Areas - Information given) No - Patient declined - Yes (MAU/Ambulatory/Procedural Areas - Information given) No - Patient declined - No - Patient declined    Current Medications (verified) Outpatient Encounter Medications as of 07/21/2020  Medication Sig  . amLODipine (NORVASC) 2.5 MG tablet Take 2.5 mg by mouth every morning.  . Calcium Carbonate-Vit D-Min (CALCIUM 1200 PO) Take 1 tablet by mouth daily.   . Lactobacillus-Inulin (CULTURELLE DIGESTIVE HEALTH PO) Take 1 capsule by mouth daily.  . Multiple Vitamins-Minerals (ONE-A-DAY WOMENS 50 PLUS PO) Take 1 tablet by mouth daily.  . pantoprazole (PROTONIX) 40 MG tablet Take 1 tablet (40 mg total) by mouth daily. (Patient taking differently: Take 40 mg by mouth every morning.)  . propranolol (INDERAL) 20 MG tablet Take 1 tablet (20 mg total) by mouth 2 (two) times daily.  . Vitamin D, Cholecalciferol, 25 MCG (1000 UT) TABS Take 1,000 Units by mouth daily.   07/23/2020 OVER THE COUNTER MEDICATION Take 2 each by mouth daily. Neuriva (Patient not taking: Reported on 07/21/2020)  . [DISCONTINUED] apixaban (ELIQUIS) 2.5 MG TABS tablet Take 1 tablet (2.5 mg total) by mouth 2 (two) times daily for 14 days.  . [DISCONTINUED] ondansetron (ZOFRAN) 4 MG tablet Take 1 tablet (4 mg  total) by mouth every 6 (six) hours as needed for nausea.  . [DISCONTINUED] oxyCODONE (OXY IR/ROXICODONE) 5 MG immediate release tablet Take 1-2 tablets (5-10 mg total) by mouth every 4 (four) hours as needed for moderate pain.  . [DISCONTINUED] traMADol (ULTRAM) 50 MG tablet Take 1-2 tablets (50-100 mg total) by mouth every 6 (six) hours as needed for moderate pain.   No facility-administered encounter medications on file as of 07/21/2020.    Allergies (verified) Lisinopril, Codeine, and Hydrochlorothiazide   History: Past Medical History:  Diagnosis Date  . Angina at rest [facility])   . Arthritis   . GERD (gastroesophageal reflux disease)   . Hypertension    Past Surgical History:  Procedure Laterality Date  . BACK SURGERY  2014   CYST REMOVAL  . BREAST BIOPSY Bilateral    neg  . DILATION AND CURETTAGE OF UTERUS    . 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 TUBAL LIGATION     Family History  Problem Relation Age of Onset  . Coronary artery disease Mother 21  . Hypertension Mother   . Stroke Father   . Diabetes Father   . Hypertension Sister   . Allergies Sister   . Breast cancer Maternal Aunt        3 mat aunts  . Breast cancer Maternal Aunt   . Breast  cancer Maternal Aunt    Social History   Socioeconomic History  . Marital status: Married    Spouse name: Not on file  . Number of children: Not on file  . Years of education: Not on file  . Highest education level: Not on file  Occupational History  . Occupation: working - Psychologist, sport and exercisebusiness owner    Comment: [employer]  Tobacco Use  . Smoking status: Never Smoker  . Smokeless tobacco: Never Used  Vaping Use  . Vaping Use: Never used  Substance and Sexual Activity  . Alcohol use: No    Alcohol/week: 0.0 standard drinks  . Drug use: No  . Sexual activity: Not Currently  Other Topics  Concern  . Not on file  Social History Narrative  . Not on file   Social Determinants of Health   Financial Resource Strain: Low Risk   . Difficulty of Paying Living Expenses: Not hard at all  Food Insecurity: No Food Insecurity  . Worried About Programme researcher, broadcasting/film/videounning Out of Food in the Last Year: Never true  . Ran Out of Food in the Last Year: Never true  Transportation Needs: No Transportation Needs  . Lack of Transportation (Medical): No  . Lack of Transportation (Non-Medical): No  Physical Activity: Sufficiently Active  . Days of Exercise per Week: 7 days  . Minutes of Exercise per Session: 60 min  Stress: No Stress Concern Present  . Feeling of Stress : Not at all  Social Connections: Moderately Isolated  . Frequency of Communication with Friends and Family: More than three times a week  . Frequency of Social Gatherings with Friends and Family: Once a week  . Attends Religious Services: Never  . Active Member of Clubs or Organizations: No  . Attends BankerClub or Organization Meetings: Never  . Marital Status: Married    Tobacco Counseling Counseling given: Not Answered   Clinical Intake:  Pre-visit preparation completed: Yes  Pain : 0-10 Pain Score: 2  Pain Location: Knee Pain Orientation: Left Pain Descriptors / Indicators: Aching Pain Onset: More than a month ago Pain Frequency: Intermittent     BMI - recorded: 33.61 Nutritional Status: BMI > 30  Obese Nutritional Risks: None  How often do you need to have someone help you when you read instructions, pamphlets, or other written materials from your doctor or pharmacy?: 1 - Never  Diabetic? No  Interpreter Needed?: No  Information entered by :: Tannisha Kennington, LPN   Activities of Daily Living In your present state of health, do you have any difficulty performing the following activities: 07/21/2020 04/13/2020  Hearing? N N  Vision? N N  Difficulty concentrating or making decisions? N N  Walking or climbing stairs? N Y   Comment - -  Dressing or bathing? N N  Doing errands, shopping? N N  Preparing Food and eating ? N -  Using the Toilet? N -  In the past six months, have you accidently leaked urine? Y -  Comment wears pads for protection -  Do you have problems with loss of bowel control? N -  Managing your Medications? N -  Managing your Finances? N -  Housekeeping or managing your Housekeeping? N -  Some recent data might be hidden    Patient Care Team: Duanne LimerickJones, Deanna C, MD as PCP - General (Family Medicine)  Indicate any recent Medical Services you may have received from other than [facility] providers in the past year (date may be approximate).  Assessment:   This is a routine wellness examination for Khamryn.  Hearing/Vision screen  Hearing Screening   125Hz  250Hz  500Hz  1000Hz  2000Hz  3000Hz  4000Hz  6000Hz  8000Hz   Right ear:           Left ear:           Comments: Pt denies hearing difficulty  Vision Screening Comments:  Vision screenings done with Dr. , past due for exam  Dietary issues and exercise activities discussed: Current Exercise Habits: Home exercise routine;Structured exercise class, Type of exercise: stretching;calisthenics, Time (Minutes): 60, Frequency (Times/Week): 3, Weekly Exercise (Minutes/Week): 180, Intensity: Mild, Exercise limited by: orthopedic condition(s)  Goals    . DIET - REDUCE PORTION SIZE    . Weight (lb) < 175 lb (79.4 kg)     Pt would like to lose weight over the next year with diet and exercise.       Depression Screen PHQ 2/9 Scores 07/21/2020 01/27/2020 07/16/2019 07/04/2019 04/04/2019 12/06/2018 02/20/2018  PHQ - 2 Score 0 0 0 0 4 0 0  PHQ- 9 Score - 0 - 2 16 0 -    Fall Risk Fall Risk  07/21/2020 01/27/2020 07/16/2019 02/20/2018 02/01/2017  Falls in the past year? 1 0 1 No No  Comment once before knee surgery - tripped over water hose - - - -  Number falls in past yr: 0 - 0 - -  Comment - - tripped over water hose - -  Injury with Fall? 0 - 0 - -   Risk for fall due to : No Fall Risks - Orthopedic patient - -  Follow up Falls prevention discussed Falls evaluation completed Falls prevention discussed - -    FALL RISK PREVENTION PERTAINING TO THE HOME:  Any stairs in or around the home? yes If so, are there any without handrails? No  Home free of loose throw rugs in walkways, pet beds, electrical cords, etc? Yes  Adequate lighting in your home to reduce risk of falls? Yes   ASSISTIVE DEVICES UTILIZED TO PREVENT FALLS:  Life alert? No  Use of a cane, walker or w/c? No  Grab bars in the bathroom? No  Shower chair or bench in shower? Yes  Elevated toilet seat or a handicapped toilet? Yes   TIMED UP AND GO:  Was the test performed? Yes .  Length of time to ambulate 10 feet: 6 sec.   Gait steady and fast without use of assistive device  Cognitive Function: Normal cognitive status assessed by direct observation by this Nurse Health Advisor. No abnormalities found.      6CIT Screen 07/16/2019  What Year? 0 points  What month? 0 points  What time? 0 points  Count back from 20 0 points  Months in reverse 0 points  Repeat phrase 0 points  Total Score 0    Immunizations Immunization History  Administered Date(s) Administered  . Influenza, High Dose Seasonal PF 02/01/2018  . Influenza, Seasonal, Injecte, Preservative Fre 05/10/2012  . Influenza,inj,Quad PF,6+ Mos 02/01/2017  . Influenza-Unspecified 02/06/2020  . PFIZER(Purple Top)SARS-COV-2 Vaccination 06/15/2019, 07/09/2019, 01/22/2020  . Pneumococcal Conjugate-13 02/01/2018  . Pneumococcal Polysaccharide-23 07/21/2020  . Tdap 12/06/2018    TDAP status: Up to date  Flu Vaccine status: Up to date  Pneumococcal vaccine status: Due, Education has been provided regarding the importance of this vaccine. Advised may receive this vaccine at local pharmacy or Health Dept. Aware to provide a copy of the vaccination record if obtained from local pharmacy or Health  Dept.  Verbalized acceptance and understanding.  Covid-19 vaccine status: Completed vaccines  Qualifies for Shingles Vaccine? Yes   Zostavax completed No   Shingrix Completed?: No.    Education has been provided regarding the importance of this vaccine. Patient has been advised to call insurance company to determine out of pocket expense if they have not yet received this vaccine. Advised may also receive vaccine at local pharmacy or Health Dept. Verbalized acceptance and understanding.  Screening Tests Health Maintenance  Topic Date Due  . Hepatitis C Screening  Never done  . MAMMOGRAM  07/21/2021  . COLONOSCOPY (Pts 45-36yrs Insurance coverage will need to be confirmed)  07/30/2027  . TETANUS/TDAP  12/05/2028  . INFLUENZA VACCINE  Completed  . DEXA SCAN  Completed  . COVID-19 Vaccine  Completed  . PNA vac Low Risk Adult  Completed  . HPV VACCINES  Aged Out    Health Maintenance  Health Maintenance Due  Topic Date Due  . Hepatitis C Screening  Never done    Colorectal cancer screening: Type of screening: Colonoscopy. Completed 07/29/2017. Repeat every 10 years  Mammogram status: Completed 07/22/19. Repeat every year  Bone Density status: Completed 09/02/19. Results reflect: Bone density results: OSTEOPENIA. Repeat every 2 years.  Lung Cancer Screening: (Low Dose CT Chest recommended if Age 28-80 years, 30 pack-year currently smoking OR have quit w/in 15years.) does not qualify.   Additional Screening:  Hepatitis C Screening: does qualify; Need to add to next routine labs  Vision Screening: Recommended annual ophthalmology exams for early detection of glaucoma and other disorders of the eye. Is the patient up to date with their annual eye exam?  No  Who is the provider or what is the name of the office in which the patient attends annual eye exams? [facility] If pt is not established with a provider, would they like to be referred to a provider to establish care? No .   Dental  Screening: Recommended annual dental exams for proper oral hygiene  Community Resource Referral / Chronic Care Management: CRR required this visit?  No   CCM required this visit?  No      Plan:     I have personally reviewed and noted the following in the patient's chart:   . Medical and social history . Use of alcohol, tobacco or illicit drugs  . Current medications and supplements . Functional ability and status . Nutritional status . Physical activity . Advanced directives . List of other physicians . Hospitalizations, surgeries, and ER visits in previous 12 months . Vitals . Screenings to include cognitive, depression, and falls . Referrals and appointments  In addition, I have reviewed and discussed with patient certain preventive protocols, quality metrics, and best practice recommendations. A written personalized care plan for preventive services as well as general preventive health recommendations were provided to patient.     [place] Constable, LPN   [id number]/33/8250   Nurse Notes: Patient to schedule routine follow up and labs, including Hepatitis C screening at check out.

## 2020-07-22 DIAGNOSIS — M25562 Pain in left knee: Secondary | ICD-10-CM | POA: Diagnosis not present

## 2020-07-22 DIAGNOSIS — Z96652 Presence of left artificial knee joint: Secondary | ICD-10-CM | POA: Diagnosis not present

## 2020-07-22 DIAGNOSIS — M6281 Muscle weakness (generalized): Secondary | ICD-10-CM | POA: Diagnosis not present

## 2020-07-22 DIAGNOSIS — M25662 Stiffness of left knee, not elsewhere classified: Secondary | ICD-10-CM | POA: Diagnosis not present

## 2020-07-28 ENCOUNTER — Other Ambulatory Visit: Payer: Self-pay

## 2020-07-28 ENCOUNTER — Ambulatory Visit (INDEPENDENT_AMBULATORY_CARE_PROVIDER_SITE_OTHER): Payer: Medicare HMO | Admitting: Family Medicine

## 2020-07-28 ENCOUNTER — Encounter: Payer: Self-pay | Admitting: Family Medicine

## 2020-07-28 VITALS — BP 120/80 | HR 72 | Ht 65.0 in | Wt 200.0 lb

## 2020-07-28 DIAGNOSIS — D649 Anemia, unspecified: Secondary | ICD-10-CM | POA: Diagnosis not present

## 2020-07-28 DIAGNOSIS — E7801 Familial hypercholesterolemia: Secondary | ICD-10-CM

## 2020-07-28 DIAGNOSIS — Z0000 Encounter for general adult medical examination without abnormal findings: Secondary | ICD-10-CM

## 2020-07-28 DIAGNOSIS — M7541 Impingement syndrome of right shoulder: Secondary | ICD-10-CM

## 2020-07-28 NOTE — Progress Notes (Signed)
Date:  07/28/2020   Name:  Lauren Leblanc   DOB:  09-10-1950   MRN:  [id number]   Chief Complaint: Annual Exam (No pap) and low hemoglobin (Need recheck on cbc)  Patient is a 70 year old female who presents for a comprehensive physical exam. The patient reports the following problems: anemia. Health maintenance has been reviewed up to date.   Lab Results  Component Value Date   CREATININE 0.81 04/15/2020   BUN 16 04/15/2020   NA 138 04/15/2020   K 4.1 04/15/2020   CL 103 04/15/2020   CO2 27 04/15/2020   Lab Results  Component Value Date   CHOL 223 (H) 07/04/2019   HDL 51 07/04/2019   LDLCALC 154 (H) 07/04/2019   TRIG 101 07/04/2019   CHOLHDL 5.3 04/25/2017   Lab Results  Component Value Date   TSH 1.359 12/11/2018   No results found for: HGBA1C Lab Results  Component Value Date   WBC 8.5 04/15/2020   HGB 9.6 (L) 04/15/2020   HCT 29.2 (L) 04/15/2020   MCV 90.7 04/15/2020   PLT 207 04/15/2020   Lab Results  Component Value Date   ALT 13 04/02/2020   AST 18 04/02/2020   ALKPHOS 62 04/02/2020   BILITOT 0.7 04/02/2020     Review of Systems  Constitutional: Negative.  Negative for chills, fatigue, fever and unexpected weight change.  HENT: Negative for congestion, ear discharge, ear pain, rhinorrhea, sinus pressure, sneezing and sore throat.   Eyes: Negative for photophobia, pain, discharge, redness and itching.  Respiratory: Negative for cough, shortness of breath, wheezing and stridor.   Gastrointestinal: Negative for abdominal pain, blood in stool, constipation, diarrhea, nausea and vomiting.  Endocrine: Negative for cold intolerance, heat intolerance, polydipsia, polyphagia and polyuria.  Genitourinary: Negative for dysuria, flank pain, frequency, hematuria, menstrual problem, pelvic pain, urgency, vaginal bleeding and vaginal discharge.  Musculoskeletal: Negative for arthralgias, back pain and myalgias.  Skin: Negative for rash.  Allergic/Immunologic:  Negative for environmental allergies and food allergies.  Neurological: Negative for dizziness, weakness, light-headedness, numbness and headaches.  Hematological: Negative for adenopathy. Does not bruise/bleed easily.  Psychiatric/Behavioral: Negative for dysphoric mood. The patient is not nervous/anxious.     Patient Active Problem List   Diagnosis Date Noted  . Spondylolisthesis of lumbar region 06/18/2020  . Sacroiliitis (HCC) 06/18/2020  . Status post total knee replacement using cement, left 04/13/2020  . Esophageal dysphagia 07/04/2019  . Familial hypercholesterolemia 07/04/2019  . Gastroesophageal reflux disease 07/04/2019  . Obesity (BMI 30.0-34.9) 03/13/2018  . Primary osteoarthritis of left knee 03/13/2018  . Hyperlipidemia, mixed 04/26/2017  . Chest pain 04/24/2017  . Essential hypertension 01/06/2016    Allergies  Allergen Reactions  . Lisinopril Other (See Comments)    Chest pain, dizziness, cough  . Codeine Hives    "hair crawling"   . Hydrochlorothiazide Anxiety    Jittery feeling     Past Surgical History:  Procedure Laterality Date  . BACK SURGERY  2014   CYST REMOVAL  . BREAST BIOPSY Bilateral    neg  . DILATION AND CURETTAGE OF UTERUS    . TOTAL KNEE ARTHROPLASTY Left 04/13/2020   Procedure: TOTAL KNEE ARTHROPLASTY;  Surgeon: Christena Flake, MD;  Location: [facility] ORS;  Service: Orthopedics;  Laterality: Left;  . TUBAL LIGATION      Social History   Tobacco Use  . Smoking status: Never Smoker  . Smokeless tobacco: Never Used  Vaping Use  . Vaping  Use: Never used  Substance Use Topics  . Alcohol use: No    Alcohol/week: 0.0 standard drinks  . Drug use: No     Medication list has been reviewed and updated.  Current Meds  Medication Sig  . amLODipine (NORVASC) 2.5 MG tablet Take 2.5 mg by mouth every morning.  . Calcium Carbonate-Vit D-Min (CALCIUM 1200 PO) Take 1 tablet by mouth daily.   . Lactobacillus-Inulin (CULTURELLE DIGESTIVE HEALTH  PO) Take 1 capsule by mouth daily.  . Multiple Vitamins-Minerals (ONE-A-DAY WOMENS 50 PLUS PO) Take 1 tablet by mouth daily.  Marland Kitchen OVER THE COUNTER MEDICATION Take 2 each by mouth daily. Neuriva  . pantoprazole (PROTONIX) 40 MG tablet Take 1 tablet (40 mg total) by mouth daily. (Patient taking differently: Take 40 mg by mouth every morning.)  . propranolol (INDERAL) 20 MG tablet Take 1 tablet (20 mg total) by mouth 2 (two) times daily.  . Vitamin D, Cholecalciferol, 25 MCG (1000 UT) TABS Take 1,000 Units by mouth daily.     PHQ 2/9 Scores 07/28/2020 07/21/2020 01/27/2020 07/16/2019  PHQ - 2 Score 0 0 0 0  PHQ- 9 Score 0 - 0 -    GAD 7 : Generalized Anxiety Score 07/28/2020 01/27/2020 07/04/2019 04/04/2019  Nervous, Anxious, on Edge 0 0 0 2  Control/stop worrying 0 0 2 0  Worry too much - different things 0 0 2 0  Trouble relaxing 0 0 0 3  Restless 0 0 0 3  Easily annoyed or irritable 0 0 0 3  Afraid - awful might happen 0 0 0 0  Total GAD 7 Score 0 0 4 11  Anxiety Difficulty - - Not difficult at all Somewhat difficult    BP Readings from Last 3 Encounters:  07/28/20 120/80  07/21/20 132/82  04/15/20 (!) 155/67    Physical Exam Vitals and nursing note reviewed.  Constitutional:      Appearance: Normal appearance. She is well-groomed and overweight.  HENT:     Head: Normocephalic.     Jaw: There is normal jaw occlusion.     Right Ear: Hearing, tympanic membrane, ear canal and external ear normal.     Left Ear: Hearing, tympanic membrane, ear canal and external ear normal.     Nose: Nose normal.     Right Turbinates: Not swollen.     Mouth/Throat:     Lips: Pink.     Mouth: Mucous membranes are moist.     Dentition: Normal dentition.     Tongue: No lesions. Tongue does not deviate from midline.     Palate: No mass and lesions.     Pharynx: Oropharynx is clear. Uvula midline. No pharyngeal swelling, oropharyngeal exudate, posterior oropharyngeal erythema or uvula swelling.      Tonsils: No tonsillar exudate.  Eyes:     General: Lids are normal. Vision grossly intact. Gaze aligned appropriately. No scleral icterus.    Extraocular Movements: Extraocular movements intact.     Right eye: Normal extraocular motion.     Left eye: Normal extraocular motion.     Conjunctiva/sclera: Conjunctivae normal.     Right eye: Right conjunctiva is not injected.     Left eye: Left conjunctiva is not injected.     Pupils: Pupils are equal, round, and reactive to light.     Funduscopic exam:    Right eye: Red reflex present.        Left eye: Red reflex present. Neck:     Thyroid: No thyroid  mass, thyromegaly or thyroid tenderness.     Vascular: Normal carotid pulses. No carotid bruit, hepatojugular reflux or JVD.     Trachea: Trachea normal. No tracheal tenderness or tracheal deviation.  Cardiovascular:     Rate and Rhythm: Normal rate and regular rhythm.     Chest Wall: PMI is not displaced.     Pulses: Normal pulses.          Carotid pulses are 2+ on the right side and 2+ on the left side.      Radial pulses are 2+ on the right side and 2+ on the left side.       Femoral pulses are 2+ on the right side and 2+ on the left side.      Popliteal pulses are 2+ on the right side and 2+ on the left side.       Dorsalis pedis pulses are 2+ on the right side and 2+ on the left side.       Posterior tibial pulses are 2+ on the right side and 2+ on the left side.     Heart sounds: Normal heart sounds, S1 normal and S2 normal. No murmur heard.  No systolic murmur is present.  No diastolic murmur is present. No friction rub. No gallop. No S3 or S4 sounds.   Pulmonary:     Effort: Pulmonary effort is normal. No respiratory distress.     Breath sounds: Normal breath sounds. No decreased breath sounds, wheezing, rhonchi or rales.  Chest:  Breasts:     Right: Normal. No swelling, bleeding, inverted nipple, mass, nipple discharge, skin change, tenderness, axillary adenopathy or  supraclavicular adenopathy.     Left: Inverted nipple present. No swelling, bleeding, mass, nipple discharge, skin change, tenderness, axillary adenopathy or supraclavicular adenopathy.    Abdominal:     General: Bowel sounds are normal.     Palpations: Abdomen is soft. There is no hepatomegaly, splenomegaly or mass.     Tenderness: There is no abdominal tenderness. There is no guarding or rebound.     Hernia: No hernia is present. There is no hernia in the umbilical area, ventral area, left inguinal area or right inguinal area.  Genitourinary:    General: Normal vulva.     Exam position: Supine.     Labia:        Right: No rash.        Left: No rash.      Uterus: Normal. Not enlarged.      Adnexa: Right adnexa normal and left adnexa normal.       Right: No mass.         Left: No mass.       Rectum: Normal. Guaiac result negative. No mass.     Comments: Frog-legged supine Musculoskeletal:        General: No tenderness. Normal range of motion.     Cervical back: Normal, normal range of motion and neck supple.     Thoracic back: Normal.     Lumbar back: Normal.     Right lower leg: No edema.     Left lower leg: No edema.  Lymphadenopathy:     Head:     Right side of head: No submental, submandibular or tonsillar adenopathy.     Left side of head: No submental, submandibular or tonsillar adenopathy.     Cervical: No cervical adenopathy.     Right cervical: No superficial, deep or posterior cervical adenopathy.    Left  cervical: No superficial, deep or posterior cervical adenopathy.     Upper Body:     Right upper body: No supraclavicular, axillary or pectoral adenopathy.     Left upper body: No supraclavicular, axillary or pectoral adenopathy.     Lower Body: No right inguinal adenopathy. No left inguinal adenopathy.  Skin:    General: Skin is warm.     Capillary Refill: Capillary refill takes less than 2 seconds.     Coloration: Skin is not pale.     Findings: No rash.   Neurological:     Mental Status: She is alert and oriented to person, place, and time.     Cranial Nerves: Cranial nerves are intact. No cranial nerve deficit.     Sensory: Sensation is intact.     Motor: Motor function is intact.     Deep Tendon Reflexes: Reflexes are normal and symmetric. Reflexes normal.  Psychiatric:        Mood and Affect: Mood is not anxious or depressed.        Behavior: Behavior is cooperative.     Wt Readings from Last 3 Encounters:  07/28/20 200 lb (90.7 kg)  07/21/20 202 lb (91.6 kg)  04/13/20 205 lb 11 oz (93.3 kg)    BP 120/80   Pulse 72   Ht 5\' 5"  (1.651 m)   Wt 200 lb (90.7 kg)   BMI 33.28 kg/m   Assessment and Plan:                            Patient's chart was reviewed for previous encounters most recent labs most recent imaging and care everywhere. 1. Annual physical exam Docia BarrierDebra L Ostrand is a 70 y.o. female who presents today for her Complete Annual Exam. She feels well. She reports exercising as can. She reports she is sleeping well.  There was no subjective/objective concerns noted during history and physical exam today.Immunizations are reviewed and recommendations provided.   Age appropriate screening tests are discussed. Counseling given for risk factor reduction interventions.  Appropriate labs were obtained today.  2. Anemia, unspecified type Was noted at the time of her surgery there was a decrease in hemoglobin most likely secondary to surgical blood loss.  Patient will be evaluated today with a CBC to make sure that hemoglobin has returned to normal limits. - CBC w/Diff/Platelet  3. 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  Persistent.  Unstable and that she is having difficulty with range of motion.  Patient's been having to use her arm to pull herself up in a taller truck and has developed an irritation and tenderness over the supraspinatus tendon.  This is consistent with impingement and patient will continue NSAIDs at  this time with hopefully returning back to her usual mode of transportation which is require use of pulling up into the calf.  Sports medicine consult has been discussed with patient and this will be our next step. 4. Familial hypercholesterolemia Chronic.  Controlled.  Stable.  Continuing with current dietary approach.  We will check a lipid panel to see the status of current control. - Lipid Panel With LDL/HDL Ratio

## 2020-07-28 NOTE — Patient Instructions (Signed)
Secondary Shoulder Impingement Syndrome  Shoulder impingement syndrome is a condition that causes pain when connective tissues (tendons) surrounding the shoulder joint become pinched. These tendons are part of the group of muscles and tissues that help to stabilize the shoulder (rotator cuff). Secondary impingement syndrome occurs when movement of the shoulder joint is abnormal. This can happen if there is too much movement, too little movement (stiffness), or abnormal movement. What are the causes? This condition may be caused by:  Shoulder blade muscles that are weak or uncoordinated (scapular dyskinesis).  Glenohumeral instability. This happens when there is too much movement of the upper arm bone and can result from: ? Having loose joints. ? An injury that happened during repeated overhead arm movements, such as throwing.  A hard, direct hit to the shoulder. This is rare. What increases the risk? You may be more likely to develop this condition if you have injured your shoulder in the past or if you are an athlete who participates in:  Sports that involve throwing, such as baseball.  Tennis.  Swimming.  Volleyball. What are the signs or symptoms? The main symptom of this condition is pain on the front or side of the shoulder. Pain may:  Get worse when lifting or raising your arm.  Get worse at night.  Wake you up from sleeping.  Feel sharp when your shoulder is moved, and then fade to an ache. Other symptoms include:  Tenderness.  Stiffness.  Inability to raise the arm above shoulder level or behind the body.  Weakness. How is this diagnosed? This condition is diagnosed based on your symptoms, your medical history, and a physical exam. You may also have imaging studies such as:  X-rays.  MRI.  Ultrasound. How is this treated? Treatment for this condition may include:  Resting your shoulder and avoiding all activities that cause pain or put stress on the  shoulder.  Putting ice on your shoulder.  Taking NSAIDs, such as ibuprofen, to help reduce pain and swelling.  Having injections of medicines to numb the area and reduce inflammation.  Doing exercises to help you regain movement (physical therapy).  Having surgery. This may be needed if nonsurgical treatments do not help. Surgery may involve stabilizing your shoulder and repairing your rotator cuff, if needed. Follow these instructions at home: Managing pain, stiffness, and swelling  If directed, put ice on the injured area. ? Put ice in a plastic bag. ? Place a towel between your skin and the bag. ? Leave the ice on for 20 minutes, 2-3 times a day.  If directed, apply heat to the affected area as often as told by your health care provider. Use the heat source that your health care provider recommends, such as a moist heat pack or a heating pad. ? Place a towel between your skin and the heat source. ? Leave the heat on for 20-30 minutes. ? Remove the heat if your skin turns bright red. This is especially important if you are unable to feel pain, heat, or cold. You may have a greater risk of getting burned.      Activity  Rest and return to your normal activities as told by your health care provider. Ask your health care provider what activities are safe for you.  Do exercises as told by your health care provider.  Ask your health care provider when it is safe for you to drive. General instructions  Take over-the-counter and prescription medicines only as told by your health  care provider.  Do not use any products that contain nicotine or tobacco, such as cigarettes, e-cigarettes, and chewing tobacco. If you need help quitting, ask your health care provider.  Keep all follow-up visits as told by your health care provider. This is important. How is this prevented?  Give your body time to rest between periods of activity.  Maintain physical fitness, including strength in your  shoulder muscles and back muscles. Contact a health care provider if:  Your symptoms have not improved after 2-3 months of treatment.  Your symptoms are getting worse. Summary  Shoulder impingement syndrome is a condition that causes pain when connective tissues (tendons) surrounding the shoulder joint become pinched.  Secondary impingement syndrome occurs when movement of the shoulder joint is abnormal.  The main symptom of this condition is pain on the front or side of the shoulder.  It is treated with rest, ice, medicines, physical therapy, and surgery as needed. This information is not intended to replace advice given to you by your health care provider. Make sure you discuss any questions you have with your health care provider. Document Revised: 08/02/2018 Document Reviewed: 06/12/2018 Elsevier Patient Education  2021 [employer].

## 2020-07-29 ENCOUNTER — Encounter: Payer: Self-pay | Admitting: Family Medicine

## 2020-07-29 ENCOUNTER — Other Ambulatory Visit: Payer: Self-pay

## 2020-07-29 DIAGNOSIS — K219 Gastro-esophageal reflux disease without esophagitis: Secondary | ICD-10-CM

## 2020-07-29 LAB — CBC WITH DIFFERENTIAL/PLATELET
Basophils Absolute: 0 10*3/uL (ref 0.0–0.2)
Basos: 1 %
EOS (ABSOLUTE): 0.1 10*3/uL (ref 0.0–0.4)
Eos: 2 %
Hematocrit: 36.7 % (ref 34.0–46.6)
Hemoglobin: 11.8 g/dL (ref 11.1–15.9)
Immature Grans (Abs): 0 10*3/uL (ref 0.0–0.1)
Immature Granulocytes: 0 %
Lymphocytes Absolute: 1.8 10*3/uL (ref 0.7–3.1)
Lymphs: 38 %
MCH: 27.9 pg (ref 26.6–33.0)
MCHC: 32.2 g/dL (ref 31.5–35.7)
MCV: 87 fL (ref 79–97)
Monocytes Absolute: 0.3 10*3/uL (ref 0.1–0.9)
Monocytes: 6 %
Neutrophils Absolute: 2.4 10*3/uL (ref 1.4–7.0)
Neutrophils: 53 %
Platelets: 324 10*3/uL (ref 150–450)
RBC: 4.23 x10E6/uL (ref 3.77–5.28)
RDW: 13.8 % (ref 11.7–15.4)
WBC: 4.6 10*3/uL (ref 3.4–10.8)

## 2020-07-29 LAB — LIPID PANEL WITH LDL/HDL RATIO
Cholesterol, Total: 207 mg/dL — ABNORMAL HIGH (ref 100–199)
HDL: 49 mg/dL (ref >39)
LDL Chol Calc (NIH): 129 mg/dL — ABNORMAL HIGH (ref 0–99)
LDL/HDL Ratio: 2.6 ratio (ref 0.0–3.2)
Triglycerides: 165 mg/dL — ABNORMAL HIGH (ref 0–149)
VLDL Cholesterol Cal: 29 mg/dL (ref 5–40)

## 2020-07-29 MED ORDER — PANTOPRAZOLE SODIUM 40 MG PO TBEC: 40.0000 mg | DELAYED_RELEASE_TABLET | Freq: Every day | ORAL | 0 refills | Status: DC

## 2020-08-10 ENCOUNTER — Inpatient Hospital Stay: Admission: RE | Admit: 2020-08-10 | Payer: Medicare HMO | Source: Ambulatory Visit

## 2020-08-30 ENCOUNTER — Other Ambulatory Visit: Payer: Self-pay | Admitting: Family Medicine

## 2020-08-30 NOTE — Telephone Encounter (Signed)
  Notes to clinic: medication filled by a historical provider  Review for continued use and refill    Requested Prescriptions  Pending Prescriptions Disp Refills   amLODipine (NORVASC) 2.5 MG tablet [Pharmacy Med Name: amLODIPine Besylate 2.5 MG Oral Tablet] 60 tablet 0    Sig: Take 1 tablet by mouth once daily      Cardiovascular:  Calcium Channel Blockers Passed - 08/30/2020  9:57 AM      Passed - Last BP in normal range    BP Readings from Last 1 Encounters:  07/28/20 120/80          Passed - Valid encounter within last 6 months    Recent Outpatient Visits           1 month ago Annual physical exam   [facility] Duanne Limerick, MD   5 months ago Near syncope   [facility] Duanne Limerick, MD   7 months ago Essential hypertension   [facility] Duanne Limerick, MD   1 year ago Cellulitis of left upper extremity   [facility] Duanne Limerick, MD   1 year ago Essential hypertension   [facility] Duanne Limerick, MD       Future Appointments             In 5 months Duanne Limerick, MD [facility], [facility]

## 2020-10-04 ENCOUNTER — Other Ambulatory Visit: Payer: Self-pay | Admitting: Family Medicine

## 2020-10-04 NOTE — Telephone Encounter (Signed)
Requested medication (s) are due for refill today: Yes  Requested medication (s) are on the active medication list: Yes  Last refill:  08/30/20  Future visit scheduled: Yes  Notes to clinic:  See request.    Requested Prescriptions  Pending Prescriptions Disp Refills   amLODipine (NORVASC) 2.5 MG tablet [Pharmacy Med Name: amLODIPine Besylate 2.5 MG Oral Tablet] 30 tablet 0    Sig: Take 1 tablet by mouth once daily      Cardiovascular:  Calcium Channel Blockers Passed - 10/04/2020 11:48 AM      Passed - Last BP in normal range    BP Readings from Last 1 Encounters:  07/28/20 120/80          Passed - Valid encounter within last 6 months    Recent Outpatient Visits           2 months ago Annual physical exam   [facility] Duanne Limerick, MD   6 months ago Near syncope   [facility] Duanne Limerick, MD   8 months ago Essential hypertension   [facility] Duanne Limerick, MD   1 year ago Cellulitis of left upper extremity   [facility] Duanne Limerick, MD   1 year ago Essential hypertension   [facility] Duanne Limerick, MD       Future Appointments             In 4 months Duanne Limerick, MD [facility], [facility]

## 2020-10-06 DIAGNOSIS — Z96652 Presence of left artificial knee joint: Secondary | ICD-10-CM | POA: Diagnosis not present

## 2020-10-06 DIAGNOSIS — M1712 Unilateral primary osteoarthritis, left knee: Secondary | ICD-10-CM | POA: Diagnosis not present

## 2020-11-09 ENCOUNTER — Other Ambulatory Visit: Payer: Self-pay | Admitting: Family Medicine

## 2020-11-09 ENCOUNTER — Other Ambulatory Visit: Payer: Self-pay | Admitting: Internal Medicine

## 2020-11-09 DIAGNOSIS — K219 Gastro-esophageal reflux disease without esophagitis: Secondary | ICD-10-CM

## 2020-11-09 NOTE — Telephone Encounter (Signed)
Requested Prescriptions  Pending Prescriptions Disp Refills  . pantoprazole (PROTONIX) 40 MG tablet [Pharmacy Med Name: Pantoprazole Sodium 40 MG Oral Tablet Delayed Release] 90 tablet 0    Sig: Take 1 tablet by mouth once daily     Gastroenterology: Proton Pump Inhibitors Passed - 11/09/2020  9:06 PM      Passed - Valid encounter within last 12 months    Recent Outpatient Visits          3 months ago Annual physical exam   [facility] Duanne Limerick, MD   7 months ago Near syncope   [facility] Duanne Limerick, MD   9 months ago Essential hypertension   [facility] Duanne Limerick, MD   1 year ago Cellulitis of left upper extremity   [facility] Duanne Limerick, MD   1 year ago Essential hypertension   [facility] Duanne Limerick, MD      Future Appointments            In 2 months Duanne Limerick, MD [facility], [facility]

## 2020-11-09 NOTE — Telephone Encounter (Signed)
Requested Prescriptions  Pending Prescriptions Disp Refills  . amLODipine (NORVASC) 2.5 MG tablet [Pharmacy Med Name: amLODIPine Besylate 2.5 MG Oral Tablet] 30 tablet 2    Sig: Take 1 tablet by mouth once daily     Cardiovascular:  Calcium Channel Blockers Passed - 11/09/2020  9:06 PM      Passed - Last BP in normal range    BP Readings from Last 1 Encounters:  07/28/20 120/80         Passed - Valid encounter within last 6 months    Recent Outpatient Visits          3 months ago Annual physical exam   [facility] Duanne Limerick, MD   7 months ago Near syncope   [facility] Duanne Limerick, MD   9 months ago Essential hypertension   [facility] Duanne Limerick, MD   1 year ago Cellulitis of left upper extremity   [facility] Duanne Limerick, MD   1 year ago Essential hypertension   [facility] Duanne Limerick, MD      Future Appointments            In 2 months Duanne Limerick, MD [facility], [facility]

## 2020-12-17 ENCOUNTER — Telehealth: Payer: Self-pay

## 2020-12-17 NOTE — Telephone Encounter (Signed)
Patient informed and reminded to call and schedule mammogram.

## 2020-12-22 IMAGING — US US BREAST*L* LIMITED INC AXILLA
1 series · 11 of 11 positions shown · non-contrast
Comparison: Previous exam(s).

CLINICAL DATA: Delayed follow-up for a likely benign left breast
mass. The patient was last seen in Wednesday February, 2017 and a six-month
follow-up mammogram and ultrasound of the left breast was
recommended.Following patient mentioned that she has recently noted
tenderness in the lateral left breast with applied pressure.
Otherwise, she has not been experiencing pain.

EXAM:
DIGITAL DIAGNOSTIC BILATERAL MAMMOGRAM WITH CAD AND TOMO
LEFT BREAST ULTRASOUND

[Series 1: us breast*left* limited inc axilla · 0.06mm/px · 11 of 11 slices shown]
[im 1/11]
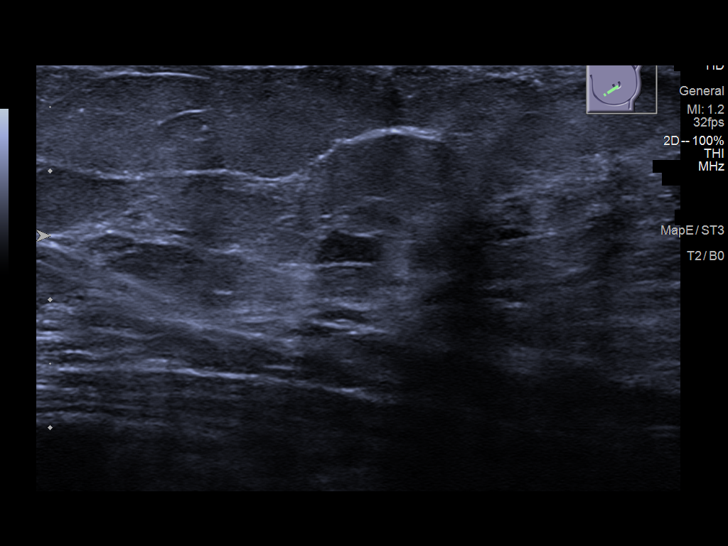
[im 2/11]
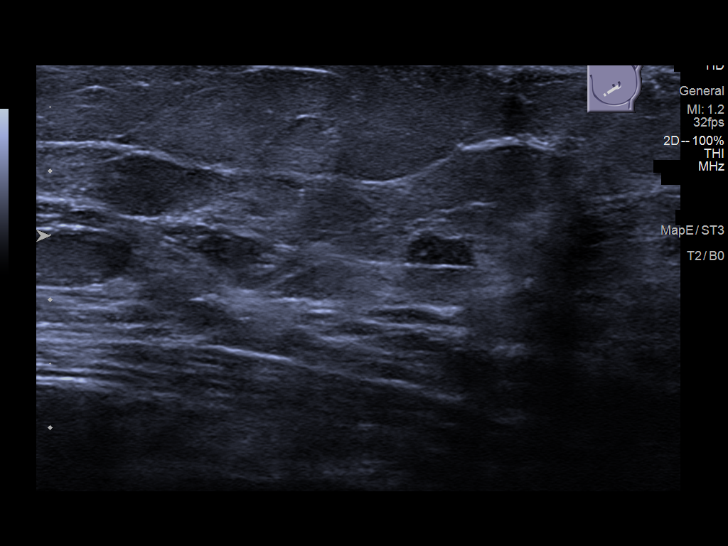
[im 3/11]
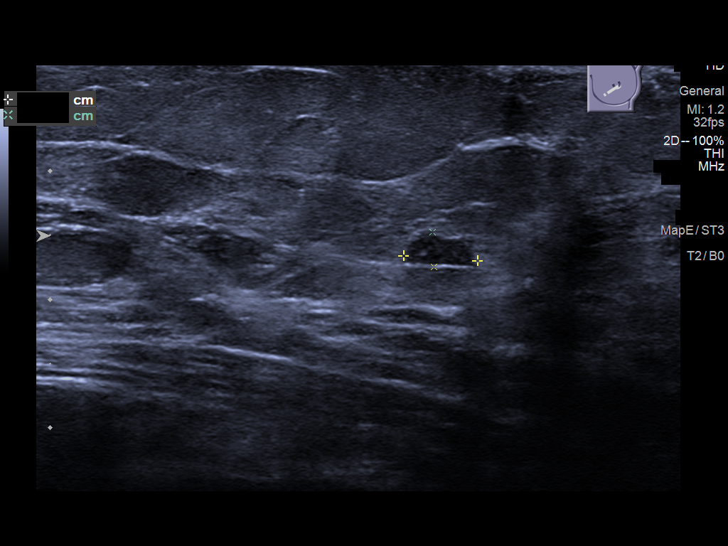
[im 4/11]
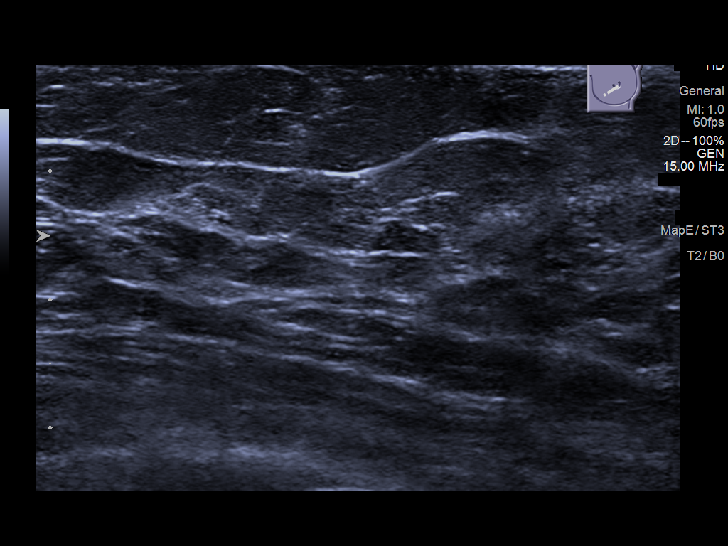
[im 5/11]
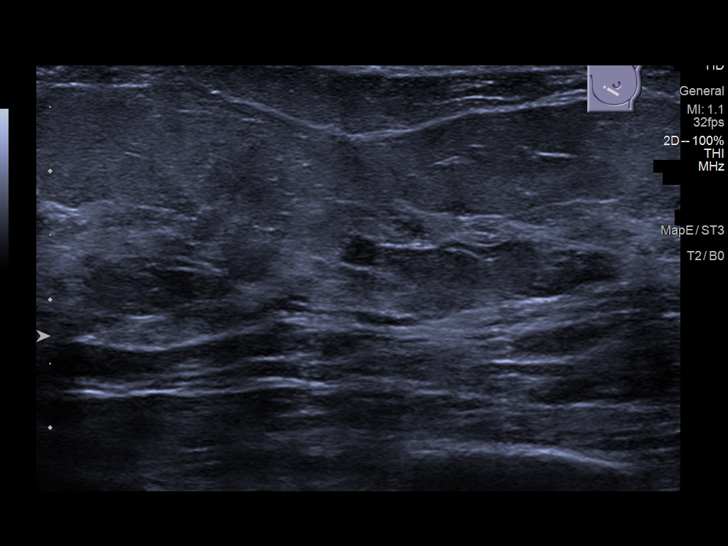
[im 6/11]
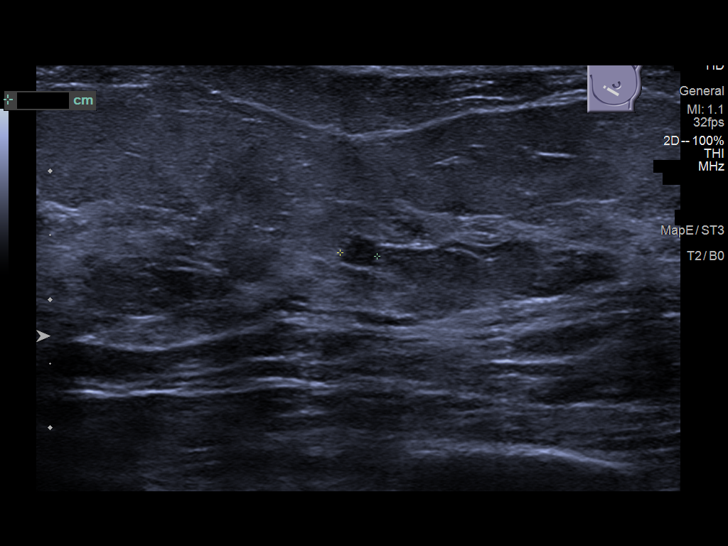
[im 7/11]
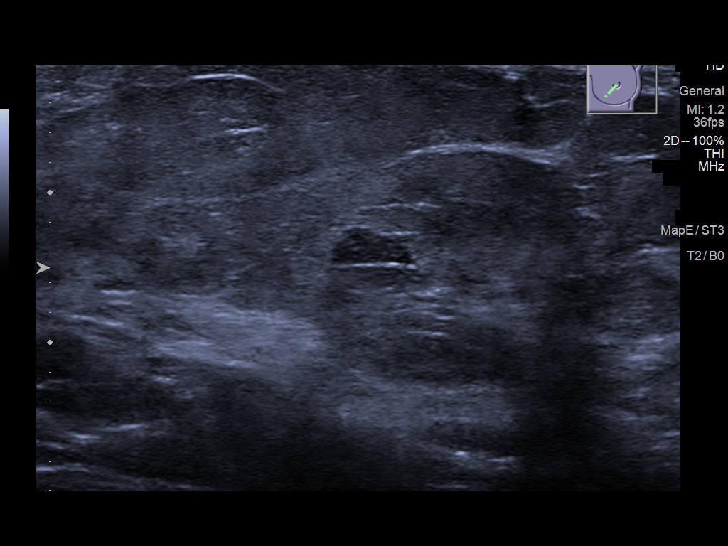
[im 8/11]
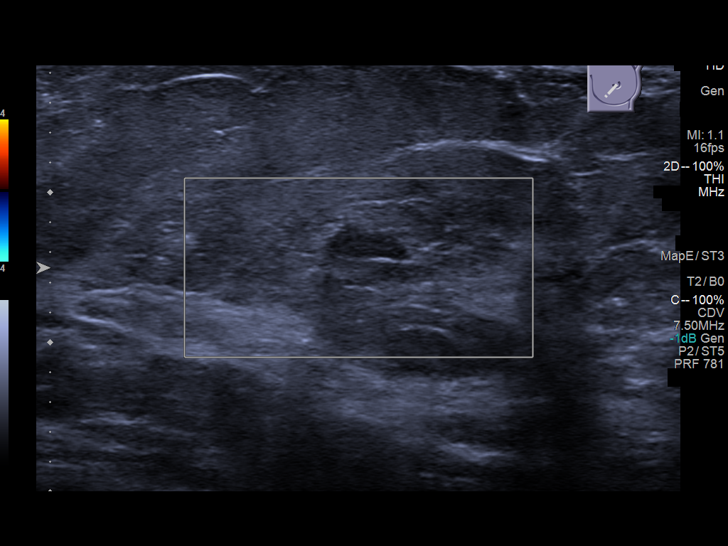
[im 9/11]
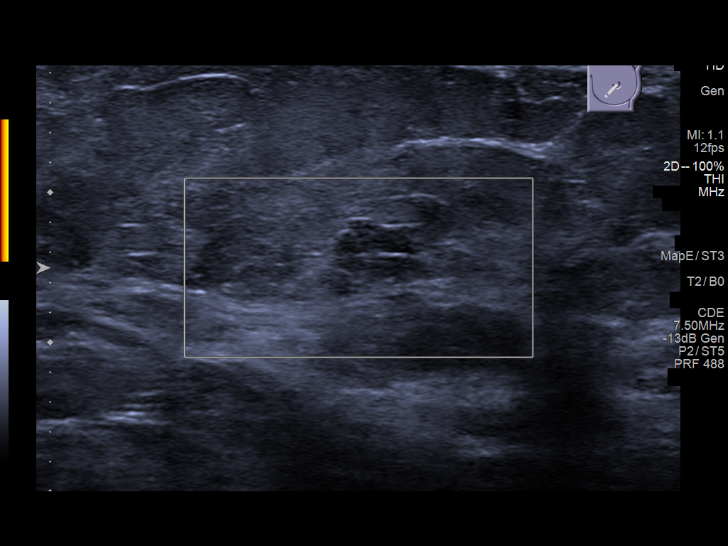
[im 10/11]
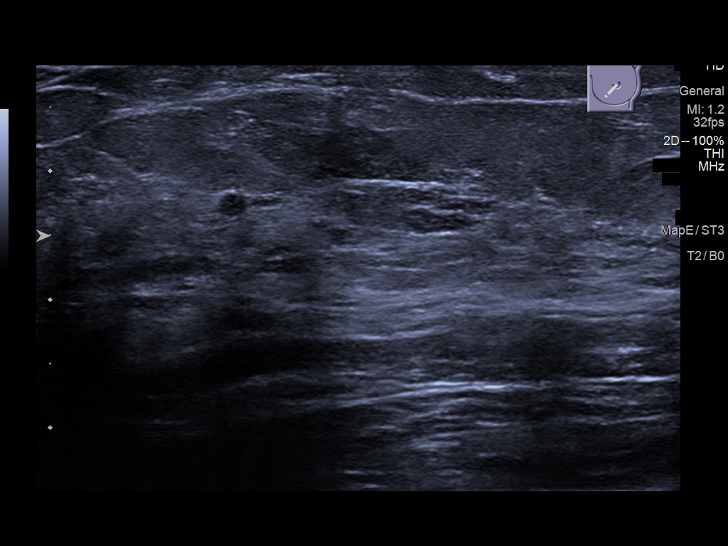
[im 11/11]
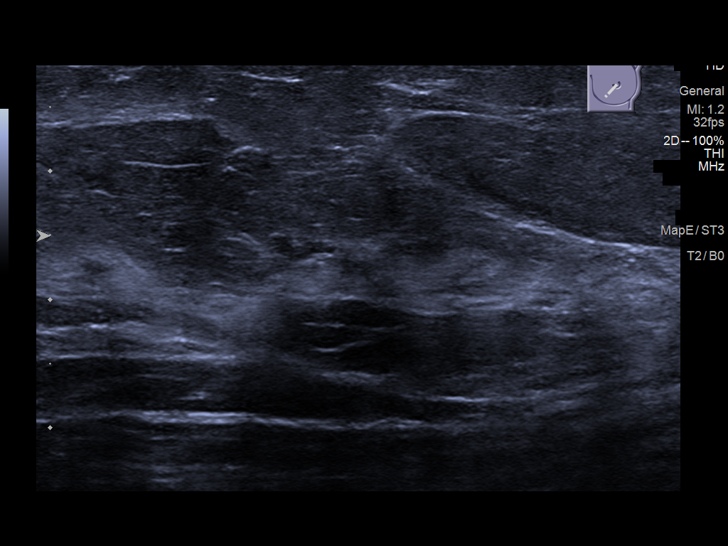

[11 of 11 positions shown; findings below may reference images not displayed]

ACR Breast Density Category b: There are scattered areas of
fibroglandular density.
FINDINGS: No suspicious calcifications, masses or areas of distortion are seen
in the bilateral breasts. The small mass previously seen in the
central slightly lower left breast is no longer visualized.

Mammographic images were processed with CAD.

Ultrasound targeted to the left breast at [DATE], 3 cm from the nipple
demonstrates that the previously seen mass has decreased in size
measuring 6 x 3 x 3 mm, previously 7 x 4 x 7 mm. In the lateral
aspect of the left breast in the region of pain, scattered
fibrocystic changes are noted. No suspicious masses or areas of
shadowing are identified.
IMPRESSION: 1. The mass in the left breast at 8 o'clock is smaller than on the
prior exam. This finding is benign.

2. Benign fibrocystic changes are noted in the lateral left breast
in the region of pain.

3.  No mammographic evidence of malignancy in the bilateral breasts.

RECOMMENDATION:
Screening mammogram in one year.(Code:V6-D-BWX)

I have discussed the findings and recommendations with the patient.
If applicable, a reminder letter will be sent to the patient
regarding the next appointment.

BI-RADS CATEGORY  2: Benign.

## 2021-02-02 ENCOUNTER — Encounter: Payer: Self-pay | Admitting: Family Medicine

## 2021-02-02 ENCOUNTER — Ambulatory Visit (INDEPENDENT_AMBULATORY_CARE_PROVIDER_SITE_OTHER): Payer: Medicare HMO | Admitting: Family Medicine

## 2021-02-02 ENCOUNTER — Other Ambulatory Visit: Payer: Self-pay

## 2021-02-02 ENCOUNTER — Ambulatory Visit
Admission: RE | Admit: 2021-02-02 | Discharge: 2021-02-02 | Disposition: A | Discharge status: Home / Self Care | Payer: Medicare HMO | Source: Ambulatory Visit | Attending: Family Medicine | Admitting: Family Medicine

## 2021-02-02 VITALS — BP 140/90 | HR 60 | Ht 65.0 in | Wt 201.0 lb

## 2021-02-02 DIAGNOSIS — Z23 Encounter for immunization: Secondary | ICD-10-CM | POA: Diagnosis not present

## 2021-02-02 DIAGNOSIS — I10 Essential (primary) hypertension: Secondary | ICD-10-CM | POA: Diagnosis not present

## 2021-02-02 DIAGNOSIS — K219 Gastro-esophageal reflux disease without esophagitis: Secondary | ICD-10-CM | POA: Diagnosis not present

## 2021-02-02 DIAGNOSIS — R053 Chronic cough: Secondary | ICD-10-CM

## 2021-02-02 DIAGNOSIS — N3941 Urge incontinence: Secondary | ICD-10-CM | POA: Diagnosis not present

## 2021-02-02 DIAGNOSIS — Z1231 Encounter for screening mammogram for malignant neoplasm of breast: Secondary | ICD-10-CM | POA: Insufficient documentation

## 2021-02-02 MED ORDER — OXYBUTYNIN CHLORIDE 5 MG PO TABS: 5.0000 mg | ORAL_TABLET | Freq: Two times a day (BID) | ORAL | 0 refills | Status: DC

## 2021-02-02 MED ORDER — AMLODIPINE BESYLATE 2.5 MG PO TABS: 2.5000 mg | ORAL_TABLET | Freq: Every day | ORAL | 1 refills | Status: DC

## 2021-02-02 MED ORDER — METOPROLOL SUCCINATE ER 25 MG PO TB24: 25.0000 mg | ORAL_TABLET | Freq: Every day | ORAL | 1 refills | Status: DC

## 2021-02-02 MED ORDER — PANTOPRAZOLE SODIUM 40 MG PO TBEC: 40.0000 mg | DELAYED_RELEASE_TABLET | Freq: Every day | ORAL | 1 refills | Status: DC

## 2021-02-02 MED ORDER — ALBUTEROL SULFATE HFA 108 (90 BASE) MCG/ACT IN AERS
2.0000 | INHALATION_SPRAY | Freq: Four times a day (QID) | RESPIRATORY_TRACT | 2 refills | Status: DC | PRN
Start: 2021-02-02 — End: 2021-07-25

## 2021-02-02 NOTE — Patient Instructions (Signed)
How to Use a Metered Dose Inhaler A metered dose inhaler (MDI) is a handheld device filled with medicine that must be breathed into the lungs (inhaled). The medicine is delivered by pushing down on a metal canister. This releases a preset amount of spray and mist through the mouth and into the lungs. Each MDI canister holds a certain number of doses (puffs). Using a spacer with a metered dose inhaler may be recommended to help get more medicine into the lungs. A spacer is a plastic tube that connects to the MDI on one end and has a mouthpiece on the other end. A spacer holds the medicine in the tube for a short time. This allows more medicine to be inhaled. The MDI can be used to deliver many kinds of inhaled medicines, including: Quick relief or rescue medicines, such as bronchodilators. Controller medicines, such as corticosteroids. What are the risks? If you do not use your inhaler correctly, medicine might not reach your lungs to help you breathe. If you do not have enough strength to push down the canister to make it spray, ask your health care provider for ways to help. The medicine in the MDI may cause side effects, such as: Mouth sores (thrush). Cough. Hoarseness. Shakiness. Headache. Supplies needed: A metered dose inhaler. A spacer, if recommended. How to use a metered dose inhaler without a spacer  Remove the cap from the inhaler. If you are using the inhaler for the first time, shake it for 5 seconds, turn it away from your face, then release 4 puffs into the air. This is called priming. Shake the inhaler for 5 seconds. Position the inhaler so the top of the canister faces up. Put your index finger on the top of the medicine canister. Support the bottom of the inhaler with your thumb. Breathe out normally and as completely as possible, away from the inhaler. Either place the inhaler between your teeth and close your lips tightly around the mouthpiece, or hold the inhaler 1-2  inches (2.5-5 cm) away from your open mouth. Keep your tongue down out of the way. If you are unsure which technique to use, ask your health care provider. Press the canister down with your index finger to release the medicine. Inhale deeply and slowly through your mouth until your lungs are completely filled. Do not breathe in through your nose. Inhaling should take 4-6 seconds. Hold the medicine in your lungs for 5-10 seconds (10 seconds is best). This helps the medicine get into the small airways of your lungs. Remove the inhaler from your mouth, turn your head, and breathe out normally. Wait about 1 minute between puffs or as directed. Then repeat steps 3-10 until you have taken the number of puffs that your health care provider directed. Put the cap on the inhaler. If you are using a steroid inhaler, rinse your mouth with water, gargle, and spit out the water. Do not swallow the water. How to use a metered dose inhaler with a spacer  Remove the cap from the inhaler. If you are using the inhaler for the first time, shake it for 5 seconds, turn it away from your face, then release 4 puffs into the air. This is called priming. Shake the inhaler for 5 seconds. Place the open end of the spacer onto the inhaler mouthpiece. Position the inhaler so the top of the canister faces up and the spacer mouthpiece faces you. Put your index finger on the top of the medicine canister. Support the  bottom of the inhaler and the spacer with your thumb. Breathe out normally and as completely as possible, away from the spacer. Place the spacer between your teeth and close your lips tightly around it. Keep your tongue down out of the way. Press the canister down with your index finger to release the medicine, then inhale deeply and slowly through your mouth until your lungs are completely filled. Do not breathe in through your nose. Inhaling should take 4-6 seconds. Hold the medicine in your lungs for 5-10 seconds  (10 seconds is best). This helps the medicine get into the small airways of your lungs. Remove the spacer from your mouth, turn your head, and breathe out normally. Wait about 1 minute between puffs or as directed. Then repeat steps 3-11 until you have taken the number of puffs that your health care provider directed. Remove the spacer from the inhaler and put the cap on the inhaler. If you are using a steroid inhaler, rinse your mouth with water, gargle, and spit out the water. Do not swallow the water. Follow these instructions at home: Caring for your MDI Store your inhaler at or near room temperature. A cold MDI will not work properly. Follow directions on the package insert for care and cleaning of your MDI and spacer. General instructions Take your inhaled medicine only as told by your health care provider. Do not use the inhaler more than directed by your health care provider. Refill your MDI with medicine before all the preset doses have been used. If your inhaler has a counter, check it to determine how full your MDI is. The number you see tells you how many doses are left. If your inhaler does not have a counter, ask your health care provider when you will need to refill it. Then write the refill date on a calendar or on your MDI canister. Keep in mind that you cannot tell when the medicine in an inhaler is empty by shaking it. You may feel or hear something in the canister even when the preset medicine doses have been used up. Keeping track of your dosages is important. Do not use any products that contain nicotine or tobacco, such as cigarettes, e-cigarettes, and chewing tobacco. If you need help quitting, ask your health care provider. Keep all follow-up visits as told by your health care provider. This is important. Where to find more information Centers for Disease Control and Prevention: [employer]Ex[employer].com.br American Lung Association: www.lung.org Contact a health care provider  if: Symptoms are only partially relieved with your inhaler. You are having trouble using your inhaler. You have side effects from the medicine. You have chills or a fever. You have night sweats. There is blood in your thick saliva (phlegm). Get help right away if: You have dizziness. You have a fast heart rate. You have severe shortness of breath. You have difficulty breathing. These symptoms may represent a serious problem that is an emergency. Do not wait to see if the symptoms will go away. Get medical help right away. Call your local emergency services (911 in the [place] Do not drive yourself to the hospital. Summary A metered dose inhaler is a handheld device for taking medicine that must be breathed into the lungs (inhaled). Take your inhaled medicine only as told by your health care provider. Do not use the inhaler more than directed by your health care provider. You cannot tell when the medicine is gone in an inhaler by shaking it. Refill it with medicine before  all the preset doses have been used. Follow directions on the package insert for care and cleaning of your MDI and spacer. This information is not intended to replace advice given to you by your health care provider. Make sure you discuss any questions you have with your health care provider. Document Revised: 05/27/2019 Document Reviewed: 05/27/2019 Elsevier Patient Education  2022 [employer].

## 2021-02-02 NOTE — Progress Notes (Signed)
Date:  02/02/2021   Name:  Lauren Leblanc   DOB:  26-Jan-1951   MRN:  [id number]   Chief Complaint: Flu Vaccine, Gastroesophageal Reflux, and Hypertension  Gastroesophageal Reflux She complains of coughing and heartburn. She reports no abdominal pain, no belching, no chest pain, no choking, no dysphagia, no early satiety, no globus sensation, no hoarse voice, no nausea, no sore throat or no wheezing. This is a chronic problem. The current episode started more than 1 year ago. The problem occurs occasionally. The problem has been gradually improving. The heartburn is of moderate intensity. The heartburn wakes her from sleep. The heartburn limits her activity. The heartburn doesn't change with position. The symptoms are aggravated by certain foods. She has tried an antacid, a PPI and a diet change for the symptoms. The treatment provided mild relief.  Hypertension This is a chronic problem. The current episode started more than 1 year ago. Pertinent negatives include no anxiety, blurred vision, chest pain, headaches, malaise/fatigue, neck pain, orthopnea, palpitations, peripheral edema or shortness of breath. Risk factors for coronary artery disease include obesity, dyslipidemia and stress. Past treatments include ACE inhibitors and diuretics.  Cough Associated symptoms include heartburn. Pertinent negatives include no chest pain, headaches, sore throat, shortness of breath or wheezing.   Lab Results  Component Value Date   CREATININE 0.81 04/15/2020   BUN 16 04/15/2020   NA 138 04/15/2020   K 4.1 04/15/2020   CL 103 04/15/2020   CO2 27 04/15/2020   Lab Results  Component Value Date   CHOL 207 (H) 07/28/2020   HDL 49 07/28/2020   LDLCALC 129 (H) 07/28/2020   TRIG 165 (H) 07/28/2020   CHOLHDL 5.3 04/25/2017   Lab Results  Component Value Date   TSH 1.359 12/11/2018   No results found for: HGBA1C Lab Results  Component Value Date   WBC 4.6 07/28/2020   HGB 11.8 07/28/2020    HCT 36.7 07/28/2020   MCV 87 07/28/2020   PLT 324 07/28/2020   Lab Results  Component Value Date   ALT 13 04/02/2020   AST 18 04/02/2020   ALKPHOS 62 04/02/2020   BILITOT 0.7 04/02/2020     Review of Systems  Constitutional:  Negative for malaise/fatigue.  HENT:  Negative for hoarse voice and sore throat.   Eyes:  Negative for blurred vision.  Respiratory:  Positive for cough. Negative for choking, shortness of breath and wheezing.   Cardiovascular:  Negative for chest pain, palpitations and orthopnea.  Gastrointestinal:  Positive for heartburn. Negative for abdominal pain, dysphagia and nausea.  Genitourinary:  Positive for frequency and urgency.       Nocturia  Musculoskeletal:  Negative for neck pain.  Neurological:  Negative for headaches.   Patient Active Problem List   Diagnosis Date Noted   Spondylolisthesis of lumbar region 06/18/2020   Sacroiliitis (HCC) 06/18/2020   Status post total knee replacement using cement, left 04/13/2020   Esophageal dysphagia 07/04/2019   Familial hypercholesterolemia 07/04/2019   Gastroesophageal reflux disease 07/04/2019   Obesity (BMI 30.0-34.9) 03/13/2018   Primary osteoarthritis of left knee 03/13/2018   Hyperlipidemia, mixed 04/26/2017   Chest pain 04/24/2017   Essential hypertension 01/06/2016    Allergies  Allergen Reactions   Lisinopril Other (See Comments)    Chest pain, dizziness, cough   Codeine Hives    "hair crawling"    Hydrochlorothiazide Anxiety    Jittery feeling     Past Surgical History:  Procedure Laterality Date  BACK SURGERY  2014   CYST REMOVAL   BREAST BIOPSY Bilateral    neg   DILATION AND CURETTAGE OF UTERUS     TOTAL KNEE ARTHROPLASTY Left 04/13/2020   Procedure: TOTAL KNEE ARTHROPLASTY;  Surgeon: Christena Flake, MD;  Location: [facility] ORS;  Service: Orthopedics;  Laterality: Left;   TUBAL LIGATION      Social History   Tobacco Use   Smoking status: Never   Smokeless tobacco: Never   Vaping Use   Vaping Use: Never used  Substance Use Topics   Alcohol use: No    Alcohol/week: 0.0 standard drinks   Drug use: No     Medication list has been reviewed and updated.  Current Meds  Medication Sig   amLODipine (NORVASC) 2.5 MG tablet Take 1 tablet by mouth once daily   Calcium Carbonate-Vit D-Min (CALCIUM 1200 PO) Take 1 tablet by mouth daily.    Lactobacillus-Inulin (CULTURELLE DIGESTIVE HEALTH PO) Take 1 capsule by mouth daily.   Multiple Vitamins-Minerals (ONE-A-DAY WOMENS 50 PLUS PO) Take 1 tablet by mouth daily.   pantoprazole (PROTONIX) 40 MG tablet Take 1 tablet by mouth once daily   propranolol (INDERAL) 20 MG tablet Take 1 tablet (20 mg total) by mouth 2 (two) times daily.   Vitamin D, Cholecalciferol, 25 MCG (1000 UT) TABS Take 1,000 Units by mouth daily.     PHQ 2/9 Scores 07/28/2020 07/21/2020 01/27/2020 07/16/2019  PHQ - 2 Score 0 0 0 0  PHQ- 9 Score 0 - 0 -    GAD 7 : Generalized Anxiety Score 07/28/2020 01/27/2020 07/04/2019 04/04/2019  Nervous, Anxious, on Edge 0 0 0 2  Control/stop worrying 0 0 2 0  Worry too much - different things 0 0 2 0  Trouble relaxing 0 0 0 3  Restless 0 0 0 3  Easily annoyed or irritable 0 0 0 3  Afraid - awful might happen 0 0 0 0  Total GAD 7 Score 0 0 4 11  Anxiety Difficulty - - Not difficult at all Somewhat difficult    BP Readings from Last 3 Encounters:  02/02/21 140/90  07/28/20 120/80  07/21/20 132/82    Physical Exam Vitals and nursing note reviewed.  Constitutional:      Appearance: She is well-developed.  HENT:     Head: Normocephalic.     Right Ear: Tympanic membrane, ear canal and external ear normal.     Left Ear: Tympanic membrane, ear canal and external ear normal.     Nose: Nose normal. No congestion or rhinorrhea.  Eyes:     General: Lids are everted, no foreign bodies appreciated. No scleral icterus.       Left eye: No foreign body or hordeolum.     Conjunctiva/sclera: Conjunctivae normal.      Right eye: Right conjunctiva is not injected.     Left eye: Left conjunctiva is not injected.     Pupils: Pupils are equal, round, and reactive to light.  Neck:     Thyroid: No thyromegaly.     Vascular: No JVD.     Trachea: No tracheal deviation.  Cardiovascular:     Rate and Rhythm: Normal rate and regular rhythm.     Heart sounds: Normal heart sounds. No murmur heard.   No friction rub. No gallop.  Pulmonary:     Effort: Pulmonary effort is normal. No respiratory distress.     Breath sounds: Normal breath sounds. No wheezing, rhonchi or rales.  Abdominal:  General: Bowel sounds are normal.     Palpations: Abdomen is soft. There is no mass.     Tenderness: There is no abdominal tenderness. There is no guarding or rebound.  Musculoskeletal:        General: No tenderness. Normal range of motion.     Cervical back: Normal range of motion and neck supple.  Lymphadenopathy:     Cervical: No cervical adenopathy.  Skin:    General: Skin is warm.     Findings: No rash.  Neurological:     Mental Status: She is alert and oriented to person, place, and time.     Cranial Nerves: No cranial nerve deficit.     Deep Tendon Reflexes: Reflexes normal.  Psychiatric:        Mood and Affect: Mood is not anxious or depressed.    Wt Readings from Last 3 Encounters:  02/02/21 201 lb (91.2 kg)  07/28/20 200 lb (90.7 kg)  07/21/20 202 lb (91.6 kg)    BP 140/90   Pulse 60   Ht 5\' 5"  (1.651 m)   Wt 201 lb (91.2 kg)   BMI 33.45 kg/m   Assessment and Plan:  1. Essential hypertension Chronic.  Controlled.  Stable.  Blood pressure today is 140/90.  Continue amlodipine 2.5 mg once a day and metoprolol XL 25 mg once a day.  Will check renal function panel for electrolytes and GFR.  We will recheck in 6 months - amLODipine (NORVASC) 2.5 MG tablet; Take 1 tablet (2.5 mg total) by mouth daily.  Dispense: 90 tablet; Refill: 1 - Renal Function Panel - metoprolol succinate (TOPROL-XL) 25 MG 24 hr  tablet; Take 1 tablet (25 mg total) by mouth daily.  Dispense: 90 tablet; Refill: 1  2. Gastroesophageal reflux disease, unspecified whether esophagitis present Chronic.  Controlled.  Stable.  Continue pantoprazole 40 mg once a day. - pantoprazole (PROTONIX) 40 MG tablet; Take 1 tablet (40 mg total) by mouth daily.  Dispense: 90 tablet; Refill: 1  3. Urgency incontinence Chronic.  Controlled.  Stable.  Continue Ditropan 5 mg 1 every 12 hours and will refer to urology for evaluation thereof. - Ambulatory referral to Urology - oxybutynin (DITROPAN) 5 MG tablet; Take 1 tablet (5 mg total) by mouth every 12 (twelve) hours.  Dispense: 60 tablet; Refill: 0  4. Persistent cough Chronic.  Persistent episodic.  Patient continues to have a cough which may be due to postnasal drainage, or reflux, reactive airway disease.  There is some increased expiratory to inspiratory ratio we would initiate albuterol inhaler 2 puffs every 6 hours to see if this may help with the cough with anticipation of continuance of pantoprazole. - albuterol (VENTOLIN HFA) 108 (90 Base) MCG/ACT inhaler; Inhale 2 puffs into the lungs every 6 (six) hours as needed for wheezing or shortness of breath.  Dispense: 8 g; Refill: 2  5. Need for immunization against influenza Discussed and administered. - Flu Vaccine QUAD High Dose(Fluad)

## 2021-02-03 LAB — RENAL FUNCTION PANEL
Albumin: 4.3 g/dL (ref 3.8–4.8)
BUN/Creatinine Ratio: 20 (ref 12–28)
BUN: 18 mg/dL (ref 8–27)
CO2: 26 mmol/L (ref 20–29)
Calcium: 9.8 mg/dL (ref 8.7–10.3)
Chloride: 104 mmol/L (ref 96–106)
Creatinine, Ser: 0.9 mg/dL (ref 0.57–1.00)
Glucose: 96 mg/dL (ref 70–99)
Phosphorus: 3.6 mg/dL (ref 3.0–4.3)
Potassium: 4.8 mmol/L (ref 3.5–5.2)
Sodium: 144 mmol/L (ref 134–144)
eGFR: 70 mL/min/{1.73_m2} (ref >59)

## 2021-02-04 ENCOUNTER — Encounter: Payer: Self-pay | Admitting: Urology

## 2021-04-13 ENCOUNTER — Ambulatory Visit: Payer: Self-pay | Admitting: *Deleted

## 2021-04-13 NOTE — Telephone Encounter (Signed)
Reason for Disposition  [1] HIGH RISK for severe COVID complications (e.g., weak immune system, age > 64 years, obesity with BMI > 25, pregnant, chronic lung disease or other chronic medical condition) AND [2] COVID symptoms (e.g., cough, fever)  (Exceptions: Already seen by PCP and no new or worsening symptoms.)  Answer Assessment - Initial Assessment Questions 1. COVID-19 DIAGNOSIS: "Who made your COVID-19 diagnosis?" "Was it confirmed by a positive lab test or self-test?" If not diagnosed by a doctor (or NP/PA), ask "Are there lots of cases (community spread) where you live?" Note: See public health department website, if unsure.     Pt calling in.   She thinks she may have Covid her son is positive for Covid.   His whole family is sick and I was with them. 2. COVID-19 EXPOSURE: "Was there any known exposure to COVID before the symptoms began?" CDC Definition of close contact: within 6 feet (2 meters) for a total of 15 minutes or more over a 24-hour period.      Son 3. ONSET: "When did the COVID-19 symptoms start?"      My chest is hurting over the weekend.   Last night fever and chills and congestion in my chest.   I've had all my covid shots. 4. WORST SYMPTOM: "What is your worst symptom?" (e.g., cough, fever, shortness of breath, muscle aches)     Coughing up green 5. COUGH: "Do you have a cough?" If Yes, ask: "How bad is the cough?"       Yes green mucus 6. FEVER: "Do you have a fever?" If Yes, ask: "What is your temperature, how was it measured, and when did it start?"     Yes 7. RESPIRATORY STATUS: "Describe your breathing?" (e.g., shortness of breath, wheezing, unable to speak)      Chest is hurting all the time over the weekend I had a catch in the center of my chest.  Today I don't feel the catch.   When I cough it really hurts and feels raw and with a deep breath 8. BETTER-SAME-WORSE: "Are you getting better, staying the same or getting worse compared to yesterday?"  If getting worse,  ask, "In what way?"     Chest is more painful. 9. HIGH RISK DISEASE: "Do you have any chronic medical problems?" (e.g., asthma, heart or lung disease, weak immune system, obesity, etc.)     No lung problems.  I didn't smoke.   I have hypertension.   10. VACCINE: "Have you had the COVID-19 vaccine?" If Yes, ask: "Which one, how many shots, when did you get it?"       Yes 11. BOOSTER: "Have you received your COVID-19 booster?" If Yes, ask: "Which one and when did you get it?"       Had booster almost 2 wks ago 12. PREGNANCY: "Is there any chance you are pregnant?" "When was your last menstrual period?"       N/A 13. OTHER SYMPTOMS: "Do you have any other symptoms?"  (e.g., chills, fatigue, headache, loss of smell or taste, muscle pain, sore throat)       Runny nose, sore throat, my ears feel full, headache.    14. O2 SATURATION MONITOR:  "Do you use an oxygen saturation monitor (pulse oximeter) at home?" If Yes, ask "What is your reading (oxygen level) today?" "What is your usual oxygen saturation reading?" (e.g., 95%)       Not asked  Protocols used: Coronavirus (COVID-19) Diagnosed or Suspected-A-AH

## 2021-04-13 NOTE — Telephone Encounter (Signed)
°  Chief Complaint: Exposed to covid Symptoms: coughing green mucus, headache, runny nose, sore throat chest sore with coughing Frequency: constantly since yesterday (Tues) Pertinent Negatives: Patient denies shortness of breath  Disposition: [] ED /[] Urgent Care (no appt availability in office) / [x] Appointment(In office/virtual)/ []  [facility]/ [] Home Care/ [] Refused Recommended Disposition  Additional Notes: Made pt an appt with Dr. for 04/14/2021 at 11:00 in office visit.   She's not good with computers so requested in office visit.   Has not tested for covid but around son and family last weekend who are sick with Covid.   She started feeling bad yesterday (Tues).

## 2021-04-14 ENCOUNTER — Other Ambulatory Visit: Payer: Self-pay

## 2021-04-14 ENCOUNTER — Ambulatory Visit (INDEPENDENT_AMBULATORY_CARE_PROVIDER_SITE_OTHER): Payer: Medicare HMO | Admitting: Family Medicine

## 2021-04-14 ENCOUNTER — Ambulatory Visit
Admission: RE | Admit: 2021-04-14 | Discharge: 2021-04-14 | Disposition: A | Discharge status: Home / Self Care | Payer: Medicare HMO | Attending: Family Medicine | Admitting: Family Medicine

## 2021-04-14 ENCOUNTER — Telehealth: Payer: Self-pay

## 2021-04-14 ENCOUNTER — Ambulatory Visit: Payer: Self-pay

## 2021-04-14 ENCOUNTER — Encounter: Payer: Self-pay | Admitting: Family Medicine

## 2021-04-14 ENCOUNTER — Ambulatory Visit
Admission: RE | Admit: 2021-04-14 | Discharge: 2021-04-14 | Disposition: A | Discharge status: Home / Self Care | Payer: Medicare HMO | Source: Ambulatory Visit | Attending: Family Medicine | Admitting: Family Medicine

## 2021-04-14 VITALS — BP 144/80 | HR 88 | Temp 101.3°F | Ht 65.0 in | Wt 201.0 lb

## 2021-04-14 DIAGNOSIS — R059 Cough, unspecified: Secondary | ICD-10-CM | POA: Diagnosis not present

## 2021-04-14 DIAGNOSIS — U071 COVID-19: Secondary | ICD-10-CM | POA: Insufficient documentation

## 2021-04-14 DIAGNOSIS — R051 Acute cough: Secondary | ICD-10-CM

## 2021-04-14 DIAGNOSIS — J069 Acute upper respiratory infection, unspecified: Secondary | ICD-10-CM | POA: Diagnosis not present

## 2021-04-14 LAB — POCT INFLUENZA A/B
Influenza A, POC: NEGATIVE
Influenza B, POC: NEGATIVE

## 2021-04-14 LAB — POC COVID19 BINAXNOW: SARS Coronavirus 2 Ag: POSITIVE — AB

## 2021-04-14 MED ORDER — MOLNUPIRAVIR EUA 200MG CAPSULE: 4.0000 | ORAL_CAPSULE | Freq: Two times a day (BID) | ORAL | 0 refills | Status: AC

## 2021-04-14 MED ORDER — GUAIFENESIN-CODEINE 100-10 MG/5ML PO SYRP: 5.0000 mL | ORAL_SOLUTION | Freq: Three times a day (TID) | ORAL | 0 refills | Status: DC | PRN

## 2021-04-14 NOTE — Telephone Encounter (Signed)
Patient is allergic to Codeine so did not pick up Rx sent today by Dr. Yetta Barre for cough, Robtussin AC. Patient advised to ask for new Rx for cough to be sent to the pharmacy.   Summary: patient need new Rx for cough can not take the one sent to pharmacy due to allergy   Patient called in to inform Dr Yetta Barre that she went to pharmacy for medication but did not get the cough medicine guaiFENesin-codeine (ROBITUSSIN AC) 100-10 MG/5ML syrup and can not pick it up because she is allergic to codeine. Please advise patient ask for another cough medication to be sent to pharmacy call Ph# [phone]

## 2021-04-14 NOTE — Telephone Encounter (Signed)
Pt called in stating she is allergic to codeine. D/C the Robitussin and pick up cough syrup otc.

## 2021-04-14 NOTE — Patient Instructions (Signed)

## 2021-04-14 NOTE — Progress Notes (Signed)
Date:  04/14/2021   Name:  Lauren Leblanc   DOB:  03-09-1951   MRN:  [id number]   Chief Complaint: Cough (Symptoms started Saturday- cleaning son's house after they were all sick. SOB, cough, fever, chills, sore throat, green production)  Cough This is a new (positive for covid) problem. The current episode started in the past 7 days. The problem has been gradually worsening. The problem occurs every few minutes. The cough is Productive of purulent sputum (green). Associated symptoms include a fever, headaches, myalgias, nasal congestion, rhinorrhea and a sore throat. Pertinent negatives include no chest pain, chills, ear congestion, ear pain, heartburn, hemoptysis, postnasal drip, rash, shortness of breath, sweats, weight loss or wheezing. Treatments tried: mucinex/claritin. The treatment provided no relief.   Lab Results  Component Value Date   NA 144 02/02/2021   K 4.8 02/02/2021   CO2 26 02/02/2021   GLUCOSE 96 02/02/2021   BUN 18 02/02/2021   CREATININE 0.90 02/02/2021   CALCIUM 9.8 02/02/2021   EGFR 70 02/02/2021   GFRNONAA >60 04/15/2020   Lab Results  Component Value Date   CHOL 207 (H) 07/28/2020   HDL 49 07/28/2020   LDLCALC 129 (H) 07/28/2020   TRIG 165 (H) 07/28/2020   CHOLHDL 5.3 04/25/2017   Lab Results  Component Value Date   TSH 1.359 12/11/2018   No results found for: HGBA1C Lab Results  Component Value Date   WBC 4.6 07/28/2020   HGB 11.8 07/28/2020   HCT 36.7 07/28/2020   MCV 87 07/28/2020   PLT 324 07/28/2020   Lab Results  Component Value Date   ALT 13 04/02/2020   AST 18 04/02/2020   ALKPHOS 62 04/02/2020   BILITOT 0.7 04/02/2020   No results found for: 25OHVITD2, 25OHVITD3, VD25OH   Review of Systems  Constitutional:  Positive for fever. Negative for chills and weight loss.  HENT:  Positive for rhinorrhea and sore throat. Negative for ear pain and postnasal drip.   Respiratory:  Positive for cough. Negative for apnea, hemoptysis,  choking, chest tightness, shortness of breath, wheezing and stridor.   Cardiovascular:  Negative for chest pain, palpitations and leg swelling.  Gastrointestinal:  Negative for heartburn.  Musculoskeletal:  Positive for myalgias.  Skin:  Negative for rash.  Neurological:  Positive for headaches.   Patient Active Problem List   Diagnosis Date Noted   Spondylolisthesis of lumbar region 06/18/2020   Sacroiliitis ([place]) 06/18/2020   Status post total knee replacement using cement, left 04/13/2020   Esophageal dysphagia 07/04/2019   Familial hypercholesterolemia 07/04/2019   Gastroesophageal reflux disease 07/04/2019   Obesity (BMI 30.0-34.9) 03/13/2018   Primary osteoarthritis of left knee 03/13/2018   Hyperlipidemia, mixed 04/26/2017   Chest pain 04/24/2017   Essential hypertension 01/06/2016    Allergies  Allergen Reactions   Lisinopril Other (See Comments)    Chest pain, dizziness, cough   Codeine Hives    "hair crawling"    Hydrochlorothiazide Anxiety    Jittery feeling     Past Surgical History:  Procedure Laterality Date   BACK SURGERY  2014   CYST REMOVAL   BREAST BIOPSY Bilateral    neg   DILATION AND CURETTAGE OF UTERUS     TOTAL KNEE ARTHROPLASTY Left 04/13/2020   Procedure: TOTAL KNEE ARTHROPLASTY;  Surgeon: Corky Mull, MD;  Location: [facility] ORS;  Service: Orthopedics;  Laterality: Left;   TUBAL LIGATION      Social History   Tobacco Use  Smoking status: Never   Smokeless tobacco: Never  Vaping Use   Vaping Use: Never used  Substance Use Topics   Alcohol use: No    Alcohol/week: 0.0 standard drinks   Drug use: No     Medication list has been reviewed and updated.  Current Meds  Medication Sig   albuterol (VENTOLIN HFA) 108 (90 Base) MCG/ACT inhaler Inhale 2 puffs into the lungs every 6 (six) hours as needed for wheezing or shortness of breath.   amLODipine (NORVASC) 2.5 MG tablet Take 1 tablet (2.5 mg total) by mouth daily.   Calcium  Carbonate-Vit D-Min (CALCIUM 1200 PO) Take 1 tablet by mouth daily.    Lactobacillus-Inulin (CULTURELLE DIGESTIVE HEALTH PO) Take 1 capsule by mouth daily.   metoprolol succinate (TOPROL-XL) 25 MG 24 hr tablet Take 1 tablet (25 mg total) by mouth daily.   Multiple Vitamins-Minerals (ONE-A-DAY WOMENS 50 PLUS PO) Take 1 tablet by mouth daily.   OVER THE COUNTER MEDICATION Take 2 each by mouth daily. Neuriva   oxybutynin (DITROPAN) 5 MG tablet Take 1 tablet (5 mg total) by mouth every 12 (twelve) hours.   pantoprazole (PROTONIX) 40 MG tablet Take 1 tablet (40 mg total) by mouth daily.   Vitamin D, Cholecalciferol, 25 MCG (1000 UT) TABS Take 1,000 Units by mouth daily.     PHQ 2/9 Scores 07/28/2020 07/21/2020 01/27/2020 07/16/2019  PHQ - 2 Score 0 0 0 0  PHQ- 9 Score 0 - 0 -    GAD 7 : Generalized Anxiety Score 07/28/2020 01/27/2020 07/04/2019 04/04/2019  Nervous, Anxious, on Edge 0 0 0 2  Control/stop worrying 0 0 2 0  Worry too much - different things 0 0 2 0  Trouble relaxing 0 0 0 3  Restless 0 0 0 3  Easily annoyed or irritable 0 0 0 3  Afraid - awful might happen 0 0 0 0  Total GAD 7 Score 0 0 4 11  Anxiety Difficulty - - Not difficult at all Somewhat difficult    BP Readings from Last 3 Encounters:  04/14/21 (!) 144/80  02/02/21 140/90  07/28/20 120/80    Physical Exam Vitals and nursing note reviewed.  Constitutional:      General: She is not in acute distress.    Appearance: She is not diaphoretic.  HENT:     Head: Normocephalic and atraumatic.     Right Ear: Tympanic membrane, ear canal and external ear normal. There is no impacted cerumen.     Left Ear: Tympanic membrane, ear canal and external ear normal. There is no impacted cerumen.     Nose: Nose normal. No congestion or rhinorrhea.     Right Turbinates: Swollen.     Left Turbinates: Swollen.     Mouth/Throat:     Mouth: Mucous membranes are moist.  Eyes:     General:        Right eye: No discharge.        Left  eye: No discharge.     Conjunctiva/sclera: Conjunctivae normal.     Pupils: Pupils are equal, round, and reactive to light.  Neck:     Thyroid: No thyromegaly.     Vascular: No JVD.  Cardiovascular:     Rate and Rhythm: Normal rate and regular rhythm.     Heart sounds: Normal heart sounds. No murmur heard.   No friction rub. No gallop.  Pulmonary:     Effort: Pulmonary effort is normal.     Breath sounds:  Normal breath sounds. No wheezing, rhonchi or rales.  Chest:     Chest wall: No tenderness.  Abdominal:     General: Bowel sounds are normal.     Palpations: Abdomen is soft. There is no mass.     Tenderness: There is no abdominal tenderness. There is no guarding.  Musculoskeletal:        General: Normal range of motion.     Cervical back: Normal range of motion and neck supple.  Lymphadenopathy:     Cervical: No cervical adenopathy.  Skin:    General: Skin is warm and dry.  Neurological:     Mental Status: She is alert.     Deep Tendon Reflexes: Reflexes are normal and symmetric.    Wt Readings from Last 3 Encounters:  04/14/21 201 lb (91.2 kg)  02/02/21 201 lb (91.2 kg)  07/28/20 200 lb (90.7 kg)    BP (!) 144/80    Pulse 88    Temp (!) 101.3 F (38.5 C) (Oral)    Ht _0  (1.651 m)    Wt 201 lb (91.2 kg)    SpO2 96%    BMI 33.45 kg/m   Assessment and Plan:  1. Viral upper respiratory tract infection New onset.  Persistent.  Patient with fever and cough.  This is status post exposure to COVID of multiple family members that she was in the same household.  Influenza was negative POC COVID-19 was positive.  Patient's risk score is 3 and we will initiate molnupiravir 800 mg twice a day for 5 days. - POCT Influenza A/B - POC COVID-19 - molnupiravir EUA (LAGEVRIO) 200 mg CAPS capsule; Take 4 capsules (800 mg total) by mouth 2 (two) times daily for 5 days.  Dispense: 40 capsule; Refill: 0  2. COVID-19 Patient has fever with a pulse ox that is decreased to 96.  Given the  persistence of the cough and other medical concerns we will do a chest x-ray for evaluation of pulmonary status. - molnupiravir EUA (LAGEVRIO) 200 mg CAPS capsule; Take 4 capsules (800 mg total) by mouth 2 (two) times daily for 5 days.  Dispense: 40 capsule; Refill: 0 - guaiFENesin-codeine (ROBITUSSIN AC) 100-10 MG/5ML syrup; Take 5 mLs by mouth 3 (three) times daily as needed for cough.  Dispense: 118 mL; Refill: 0 - DG Chest 2 View; Future  3. Acute cough New onset.  Persistent.  Episodic.  This is bothersome particularly at night.  We will treat with Robitussin-AC teaspoon every 8-12 hours as needed for cough. - guaiFENesin-codeine (ROBITUSSIN AC) 100-10 MG/5ML syrup; Take 5 mLs by mouth 3 (three) times daily as needed for cough.  Dispense: 118 mL; Refill: 0 - DG Chest 2 View; Future

## 2021-07-25 ENCOUNTER — Ambulatory Visit (INDEPENDENT_AMBULATORY_CARE_PROVIDER_SITE_OTHER): Payer: Medicare HMO

## 2021-07-25 VITALS — BP 122/82 | HR 70 | Temp 98.0°F | Resp 16 | Ht 65.0 in | Wt 206.8 lb

## 2021-07-25 DIAGNOSIS — L989 Disorder of the skin and subcutaneous tissue, unspecified: Secondary | ICD-10-CM | POA: Diagnosis not present

## 2021-07-25 DIAGNOSIS — Z780 Asymptomatic menopausal state: Secondary | ICD-10-CM

## 2021-07-25 DIAGNOSIS — Z0000 Encounter for general adult medical examination without abnormal findings: Secondary | ICD-10-CM

## 2021-07-25 NOTE — Progress Notes (Addendum)
? ?Subjective:  ? Lauren Leblanc is a 71 y.o. female who presents for Medicare Annual (Subsequent) preventive examination. ? ?Review of Systems    ? ?Cardiac Risk Factors include: advanced age (>65men, >27 women);hypertension ? ?   ?Objective:  ?  ?Today's Vitals  ? 07/25/21 0927  ?BP: 122/82  ?Pulse: 70  ?Resp: 16  ?Temp: 98 ?F (36.7 ?C)  ?SpO2: 98%  ?Weight: 206 lb 12.8 oz (93.8 kg)  ?Height: 5\' 5"  (1.651 m)  ? ?Body mass index is 34.41 kg/m?. ? ? ?  07/25/2021  ?  9:41 AM 07/21/2020  ?  9:33 AM 04/13/2020  ?  6:10 AM 04/02/2020  ?  1:54 PM 07/16/2019  ?  9:37 AM 12/11/2018  ?  6:44 AM 06/27/2017  ?  2:27 PM  ?Advanced Directives  ?Does Patient Have a Medical Advance Directive? No No No No No No No  ?Would patient like information on creating a medical advance directive? Yes (MAU/Ambulatory/Procedural Areas - Information given) Yes (MAU/Ambulatory/Procedural Areas - Information given) No - Patient declined  Yes (MAU/Ambulatory/Procedural Areas - Information given) No - Patient declined   ? ? ?Current Medications (verified) ?Outpatient Encounter Medications as of 07/25/2021  ?Medication Sig  ? amLODipine (NORVASC) 2.5 MG tablet Take 1 tablet (2.5 mg total) by mouth daily.  ? Calcium Carbonate-Vit D-Min (CALCIUM 1200 PO) Take 1 tablet by mouth daily.   ? Lactobacillus-Inulin (CULTURELLE DIGESTIVE HEALTH PO) Take 1 capsule by mouth daily.  ? metoprolol succinate (TOPROL-XL) 25 MG 24 hr tablet Take 1 tablet (25 mg total) by mouth daily.  ? Multiple Vitamins-Minerals (ONE-A-DAY WOMENS 50 PLUS PO) Take 1 tablet by mouth daily.  ? OVER THE COUNTER MEDICATION Take 2 each by mouth daily. Neuriva  ? pantoprazole (PROTONIX) 40 MG tablet Take 1 tablet (40 mg total) by mouth daily.  ? Vitamin D, Cholecalciferol, 25 MCG (1000 UT) TABS Take 1,000 Units by mouth daily.   ? oxybutynin (DITROPAN) 5 MG tablet Take 1 tablet (5 mg total) by mouth every 12 (twelve) hours. (Patient not taking: Reported on 07/25/2021)  ? [DISCONTINUED]  albuterol (VENTOLIN HFA) 108 (90 Base) MCG/ACT inhaler Inhale 2 puffs into the lungs every 6 (six) hours as needed for wheezing or shortness of breath.  ? ?No facility-administered encounter medications on file as of 07/25/2021.  ? ? ?Allergies (verified) ?Lisinopril, Codeine, and Hydrochlorothiazide  ? ?History: ?Past Medical History:  ?Diagnosis Date  ? 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 Arthritis   ? GERD (gastroesophageal reflux disease)   ? Hypertension   ? ?Past Surgical History:  ?Procedure Laterality Date  ? BACK SURGERY  2014  ? CYST REMOVAL  ? BREAST BIOPSY Bilateral   ? neg  ? DILATION AND CURETTAGE OF UTERUS    ? TOTAL KNEE ARTHROPLASTY Left 04/13/2020  ? Procedure: TOTAL KNEE ARTHROPLASTY;  Surgeon: 04/15/2020, MD;  Location: [facility] ORS;  Service: Orthopedics;  Laterality: Left;  ? TUBAL LIGATION    ? ?Family History  ?Problem Relation Age of Onset  ? Coronary artery disease Mother 59  ? Hypertension Mother   ? Stroke Father   ? Diabetes Father   ? Hypertension Sister   ? Allergies Sister   ? Breast cancer Maternal Aunt   ?     3 mat aunts  ? Breast cancer Maternal Aunt   ? Breast cancer Maternal Aunt   ? ?Social History  ? ?Socioeconomic History  ? Marital status: Married  ?  Spouse  name: Not on file  ? Number of children: Not on file  ? Years of education: Not on file  ? Highest education level: Not on file  ?Occupational History  ? Occupation: working - Psychologist, sport and exercise  ?  Comment: [employer]  ?Tobacco Use  ? Smoking status: Never  ? Smokeless tobacco: Never  ?Vaping Use  ? Vaping Use: Never used  ?Substance and Sexual Activity  ? Alcohol use: No  ?  Alcohol/week: 0.0 standard drinks  ? Drug use: No  ? Sexual activity: Not Currently  ?Other Topics Concern  ? Not on file  ?Social History Narrative  ? Not on file  ? ?Social Determinants of Health  ? ?Financial Resource Strain: Low Risk   ? Difficulty of Paying Living Expenses: Not hard at all  ?Food Insecurity: No Food Insecurity  ? Worried About Patent examiner in the Last Year: Never true  ? Ran Out of Food in the Last Year: Never true  ?Transportation Needs: No Transportation Needs  ? Lack of Transportation (Medical): No  ? Lack of Transportation (Non-Medical): No  ?Physical Activity: Sufficiently Active  ? Days of Exercise per Week: 7 days  ? Minutes of Exercise per Session: 60 min  ?Stress: Stress Concern Present  ? Feeling of Stress : Very much  ?Social Connections: Moderately Isolated  ? Frequency of Communication with Friends and Family: More than three times a week  ? Frequency of Social Gatherings with Friends and Family: Once a week  ? Attends Religious Services: Never  ? Active Member of Clubs or Organizations: No  ? Attends Banker Meetings: Never  ? Marital Status: Married  ? ? ?Tobacco Counseling ?Counseling given: Not Answered ? ? ?Clinical Intake: ? ?Pre-visit preparation completed: Yes ? ?Pain : No/denies pain ? ?  ? ?BMI - recorded: 34.41 ?Nutritional Status: BMI > 30  Obese ?Nutritional Risks: None ?Diabetes: No ? ?How often do you need to have someone help you when you read instructions, pamphlets, or other written materials from your doctor or pharmacy?: 1 - Never ? ? ? ?Interpreter Needed?: No ? ?Information entered by :: Reather Littler LPN ? ? ?Activities of Daily Living ? ?  07/25/2021  ?  9:43 AM  ?In your present state of health, do you have any difficulty performing the following activities:  ?Hearing? 0  ?Vision? 1  ?Difficulty concentrating or making decisions? 0  ?Walking or climbing stairs? 0  ?Dressing or bathing? 0  ?Doing errands, shopping? 0  ?Preparing Food and eating ? N  ?Using the Toilet? N  ?In the past six months, have you accidently leaked urine? Y  ?Comment wears depends for protection  ?Do you have problems with loss of bowel control? Y  ?Managing your Medications? N  ?Managing your Finances? N  ?Housekeeping or managing your Housekeeping? N  ? ? ?Patient Care Team: ?Duanne Limerick, MD as PCP - General (Family  Medicine) ? ?Indicate any recent Medical Services you may have received from other than [facility] providers in the past year (date may be approximate). ? ?   ?Assessment:  ? This is a routine wellness examination for [place]. ? ?Hearing/Vision screen ?Hearing Screening - Comments:: Pt denies hearing difficulty ?Vision Screening - Comments:: Vision screenings done with Dr. Clearance Coots, past due for exam ? ?Dietary issues and exercise activities discussed: ?Current Exercise Habits: The patient has a physically strenuous job, but has no regular exercise apart from work., Time (Minutes): 60, Frequency (  Times/Week): 7, Weekly Exercise (Minutes/Week): 420, Intensity: Mild ? ? Goals Addressed   ?None ?  ? ?Depression Screen ? ?  07/25/2021  ?  9:38 AM 07/28/2020  ?  8:30 AM 07/21/2020  ?  9:31 AM 01/27/2020  ?  8:35 AM 07/16/2019  ?  9:34 AM 07/04/2019  ?  8:11 AM 04/04/2019  ?  8:26 AM  ?PHQ 2/9 Scores  ?PHQ - 2 Score 0 0 0 0 0 0 4  ?PHQ- 9 Score 3 0  0  2 16  ?  ?Fall Risk ? ?  07/25/2021  ?  9:43 AM 07/21/2020  ?  9:33 AM 01/27/2020  ?  8:35 AM 07/16/2019  ?  9:40 AM 02/20/2018  ? 11:08 AM  ?Fall Risk   ?Falls in the past year? 1 1 0 1 No  ?Comment  once before knee surgery - tripped over water hose     ?Number falls in past yr: 1 0  0   ?Comment    tripped over water hose   ?Injury with Fall? 0 0  0   ?Risk for fall due to : History of fall(s) No Fall Risks  Orthopedic patient   ?Follow up Falls prevention discussed Falls prevention discussed Falls evaluation completed Falls prevention discussed   ? ? ?FALL RISK PREVENTION PERTAINING TO THE HOME: ? ?Any stairs in or around the home? Yes  ?If so, are there any without handrails? No  ?Home free of loose throw rugs in walkways, pet beds, electrical cords, etc? Yes  ?Adequate lighting in your home to reduce risk of falls? Yes  ? ?ASSISTIVE DEVICES UTILIZED TO PREVENT FALLS: ? ?Life alert? No  ?Use of a cane, walker or w/c? No  ?Grab bars in the bathroom? No  ?Shower chair or bench in shower? No   ?Elevated toilet seat or a handicapped toilet? Yes  ? ?TIMED UP AND GO: ? ?Was the test performed? Yes .  ?Length of time to ambulate 10 feet: 5 sec.  ? ?Gait steady and fast without use of assistive device ?

## 2021-07-25 NOTE — Patient Instructions (Signed)
Lauren Leblanc , ?Thank you for taking time to come for your Medicare Wellness Visit. I appreciate your ongoing commitment to your health goals. Please review the following plan we discussed and let me know if I can assist you in the future.  ? ?Screening recommendations/referrals: ?Colonoscopy: done 07/29/17 ?Mammogram: done 02/02/21 ?Bone Density: done 09/02/19. Please call [phone] to schedule your bone density screening.  ?Recommended yearly ophthalmology/optometry visit for glaucoma screening and checkup ?Recommended yearly dental visit for hygiene and checkup ? ?Vaccinations: ?Influenza vaccine: done 02/02/21 ?Pneumococcal vaccine: done 07/21/20 ?Tdap vaccine: done 12/06/18 ?Shingles vaccine: Shingrix discussed. Please contact your pharmacy for coverage information.  ?Covid-19:done 06/15/19, 07/09/19, 01/22/20, 10/06/20 & 04/04/21 ? ?Advanced directives: Advance directive discussed with you today. I have provided a copy for you to complete at home and have notarized. Once this is complete please bring a copy in to our office so we can scan it into your chart.  ? ?Conditions/risks identified: Keep up the great work! ? ?Next appointment: Follow up in one year for your annual wellness visit  ? ? ?Preventive Care 46 Years and Older, Female ?Preventive care refers to lifestyle choices and visits with your health care provider that can promote health and wellness. ?What does preventive care include? ?A yearly physical exam. This is also called an annual well check. ?Dental exams once or twice a year. ?Routine eye exams. Ask your health care provider how often you should have your eyes checked. ?Personal lifestyle choices, including: ?Daily care of your teeth and gums. ?Regular physical activity. ?Eating a healthy diet. ?Avoiding tobacco and drug use. ?Limiting alcohol use. ?Practicing safe sex. ?Taking low-dose aspirin every day. ?Taking vitamin and mineral supplements as recommended by your health care provider. ?What  happens during an annual well check? ?The services and screenings done by your health care provider during your annual well check will depend on your age, overall health, lifestyle risk factors, and family history of disease. ?Counseling  ?Your health care provider may ask you questions about your: ?Alcohol use. ?Tobacco use. ?Drug use. ?Emotional well-being. ?Home and relationship well-being. ?Sexual activity. ?Eating habits. ?History of falls. ?Memory and ability to understand (cognition). ?Work and work Statistician. ?Reproductive health. ?Screening  ?You may have the following tests or measurements: ?Height, weight, and BMI. ?Blood pressure. ?Lipid and cholesterol levels. These may be checked every 5 years, or more frequently if you are over 79 years old. ?Skin check. ?Lung cancer screening. You may have this screening every year starting at age 69 if you have a 30-pack-year history of smoking and currently smoke or have quit within the past 15 years. ?Fecal occult blood test (FOBT) of the stool. You may have this test every year starting at age 69. ?Flexible sigmoidoscopy or colonoscopy. You may have a sigmoidoscopy every 5 years or a colonoscopy every 10 years starting at age 69. ?Hepatitis C blood test. ?Hepatitis B blood test. ?Sexually transmitted disease (STD) testing. ?Diabetes screening. This is done by checking your blood sugar (glucose) after you have not eaten for a while (fasting). You may have this done every 1-3 years. ?Bone density scan. This is done to screen for osteoporosis. You may have this done starting at age 69. ?Mammogram. This may be done every 1-2 years. Talk to your health care provider about how often you should have regular mammograms. ?Talk with your health care provider about your test results, treatment options, and if necessary, the need for more tests. ?Vaccines  ?Your health care provider may  recommend certain vaccines, such as: ?Influenza vaccine. This is recommended every  year. ?Tetanus, diphtheria, and acellular pertussis (Tdap, Td) vaccine. You may need a Td booster every 10 years. ?Zoster vaccine. You may need this after age 69. ?Pneumococcal 13-valent conjugate (PCV13) vaccine. One dose is recommended after age 69. ?Pneumococcal polysaccharide (PPSV23) vaccine. One dose is recommended after age 55. ?Talk to your health care provider about which screenings and vaccines you need and how often you need them. ?This information is not intended to replace advice given to you by your health care provider. Make sure you discuss any questions you have with your health care provider. ?Document Released: 05/07/2015 Document Revised: 12/29/2015 Document Reviewed: 02/09/2015 ?Elsevier Interactive Patient Education ? 2017 [employer]. ? ?Fall Prevention in the Home ?Falls can cause injuries. They can happen to people of all ages. There are many things you can do to make your home safe and to help prevent falls. ?What can I do on the outside of my home? ?Regularly fix the edges of walkways and driveways and fix any cracks. ?Remove anything that might make you trip as you walk through a door, such as a raised step or threshold. ?Trim any bushes or trees on the path to your home. ?Use bright outdoor lighting. ?Clear any walking paths of anything that might make someone trip, such as rocks or tools. ?Regularly check to see if handrails are loose or broken. Make sure that both sides of any steps have handrails. ?Any raised decks and porches should have guardrails on the edges. ?Have any leaves, snow, or ice cleared regularly. ?Use sand or salt on walking paths during winter. ?Clean up any spills in your garage right away. This includes oil or grease spills. ?What can I do in the bathroom? ?Use night lights. ?Install grab bars by the toilet and in the tub and shower. Do not use towel bars as grab bars. ?Use non-skid mats or decals in the tub or shower. ?If you need to sit down in the shower, use a  plastic, non-slip stool. ?Keep the floor dry. Clean up any water that spills on the floor as soon as it happens. ?Remove soap buildup in the tub or shower regularly. ?Attach bath mats securely with double-sided non-slip rug tape. ?Do not have throw rugs and other things on the floor that can make you trip. ?What can I do in the bedroom? ?Use night lights. ?Make sure that you have a light by your bed that is easy to reach. ?Do not use any sheets or blankets that are too big for your bed. They should not hang down onto the floor. ?Have a firm chair that has side arms. You can use this for support while you get dressed. ?Do not have throw rugs and other things on the floor that can make you trip. ?What can I do in the kitchen? ?Clean up any spills right away. ?Avoid walking on wet floors. ?Keep items that you use a lot in easy-to-reach places. ?If you need to reach something above you, use a strong step stool that has a grab bar. ?Keep electrical cords out of the way. ?Do not use floor polish or wax that makes floors slippery. If you must use wax, use non-skid floor wax. ?Do not have throw rugs and other things on the floor that can make you trip. ?What can I do with my stairs? ?Do not leave any items on the stairs. ?Make sure that there are handrails on both sides of  the stairs and use them. Fix handrails that are broken or loose. Make sure that handrails are as long as the stairways. ?Check any carpeting to make sure that it is firmly attached to the stairs. Fix any carpet that is loose or worn. ?Avoid having throw rugs at the top or bottom of the stairs. If you do have throw rugs, attach them to the floor with carpet tape. ?Make sure that you have a light switch at the top of the stairs and the bottom of the stairs. If you do not have them, ask someone to add them for you. ?What else can I do to help prevent falls? ?Wear shoes that: ?Do not have high heels. ?Have rubber bottoms. ?Are comfortable and fit you  well. ?Are closed at the toe. Do not wear sandals. ?If you use a stepladder: ?Make sure that it is fully opened. Do not climb a closed stepladder. ?Make sure that both sides of the stepladder are locked into pla

## 2021-08-01 ENCOUNTER — Telehealth: Payer: Self-pay

## 2021-08-01 NOTE — Telephone Encounter (Signed)
Called pt- bone density in [place] 09/12/21 @ 11:00- can't leave message. I sent a mychart ?

## 2021-08-03 ENCOUNTER — Ambulatory Visit (INDEPENDENT_AMBULATORY_CARE_PROVIDER_SITE_OTHER): Payer: Medicare HMO | Admitting: Family Medicine

## 2021-08-03 ENCOUNTER — Encounter: Payer: Self-pay | Admitting: Family Medicine

## 2021-08-03 VITALS — BP 120/88 | HR 76 | Ht 65.0 in | Wt 207.0 lb

## 2021-08-03 DIAGNOSIS — E7801 Familial hypercholesterolemia: Secondary | ICD-10-CM | POA: Diagnosis not present

## 2021-08-03 DIAGNOSIS — K219 Gastro-esophageal reflux disease without esophagitis: Secondary | ICD-10-CM

## 2021-08-03 DIAGNOSIS — I10 Essential (primary) hypertension: Secondary | ICD-10-CM | POA: Diagnosis not present

## 2021-08-03 DIAGNOSIS — R079 Chest pain, unspecified: Secondary | ICD-10-CM | POA: Diagnosis not present

## 2021-08-03 MED ORDER — NITROGLYCERIN 0.4 MG SL SUBL: 0.4000 mg | SUBLINGUAL_TABLET | SUBLINGUAL | 3 refills | Status: DC | PRN

## 2021-08-03 MED ORDER — PANTOPRAZOLE SODIUM 40 MG PO TBEC: 40.0000 mg | DELAYED_RELEASE_TABLET | Freq: Every day | ORAL | 1 refills | Status: DC

## 2021-08-03 MED ORDER — AMLODIPINE BESYLATE 2.5 MG PO TABS: 2.5000 mg | ORAL_TABLET | Freq: Every day | ORAL | 1 refills | Status: DC

## 2021-08-03 MED ORDER — METOPROLOL SUCCINATE ER 25 MG PO TB24: 25.0000 mg | ORAL_TABLET | Freq: Every day | ORAL | 1 refills | Status: DC

## 2021-08-03 NOTE — Progress Notes (Signed)
? ? ?Date:  08/03/2021  ? ?Name:  Lauren Leblanc   DOB:  06/03/1950   MRN:  [id number] ? ? ?Chief Complaint: Hypertension and Gastroesophageal Reflux ? ?Hypertension ?This is a chronic problem. The current episode started more than 1 year ago. The problem has been gradually improving since onset. The problem is controlled. Associated symptoms include chest pain, palpitations and shortness of breath. Pertinent negatives include no anxiety, blurred vision, headaches, malaise/fatigue, neck pain, orthopnea, peripheral edema, PND or sweats. There are no associated agents to hypertension. There are no known risk factors for coronary artery disease. Past treatments include calcium channel blockers and beta blockers. The current treatment provides moderate improvement. There are no compliance problems.  There is no history of angina, kidney disease, CAD/MI, CVA, heart failure, left ventricular hypertrophy, PVD or retinopathy. There is no history of chronic renal disease, a hypertension causing med or renovascular disease.  ?Gastroesophageal Reflux ?She complains of chest pain. She reports no abdominal pain, no coughing, no dysphagia, no nausea, no sore throat or no wheezing. This is a chronic problem. The current episode started more than 1 year ago. The problem occurs frequently. The problem has been gradually improving. The symptoms are aggravated by certain foods. Pertinent negatives include no anemia, fatigue, melena, muscle weakness, orthopnea or weight loss. She has tried a PPI for the symptoms. The treatment provided moderate relief.  ?Chest Pain  ?This is a recurrent problem. The current episode started 1 to 4 weeks ago. Progression since onset: intermitant. The pain is at a severity of 8/10. The pain is moderate. The quality of the pain is described as tightness and squeezing. The pain radiates to the mid back. Associated symptoms include exertional chest pressure, palpitations and shortness of breath.  Pertinent negatives include no abdominal pain, back pain, cough, dizziness, fever, headaches, malaise/fatigue, nausea, orthopnea, PND or weakness. The pain is aggravated by emotional upset. Treatments tried: asa.  ?Her past medical history is significant for hypertension.  ?Pertinent negatives for past medical history include no muscle weakness and no PVD.  ? ?Lab Results  ?Component Value Date  ? NA 144 02/02/2021  ? K 4.8 02/02/2021  ? CO2 26 02/02/2021  ? GLUCOSE 96 02/02/2021  ? BUN 18 02/02/2021  ? CREATININE 0.90 02/02/2021  ? CALCIUM 9.8 02/02/2021  ? EGFR 70 02/02/2021  ? GFRNONAA >60 04/15/2020  ? ?Lab Results  ?Component Value Date  ? CHOL 207 (H) 07/28/2020  ? HDL 49 07/28/2020  ? LDLCALC 129 (H) 07/28/2020  ? TRIG 165 (H) 07/28/2020  ? CHOLHDL 5.3 04/25/2017  ? ?Lab Results  ?Component Value Date  ? TSH 1.359 12/11/2018  ? ?No results found for: HGBA1C ?Lab Results  ?Component Value Date  ? WBC 4.6 07/28/2020  ? HGB 11.8 07/28/2020  ? HCT 36.7 07/28/2020  ? MCV 87 07/28/2020  ? PLT 324 07/28/2020  ? ?Lab Results  ?Component Value Date  ? ALT 13 04/02/2020  ? AST 18 04/02/2020  ? ALKPHOS 62 04/02/2020  ? BILITOT 0.7 04/02/2020  ? ?No results found for: 25OHVITD2, [place], VD25OH  ? ?Review of Systems  ?Constitutional: Negative.  Negative for chills, fatigue, fever, malaise/fatigue, unexpected weight change and weight loss.  ?HENT:  Negative for congestion, ear discharge, ear pain, rhinorrhea, sinus pressure, sneezing and sore throat.   ?Eyes:  Negative for blurred vision.  ?Respiratory:  Positive for shortness of breath. Negative for cough, wheezing and stridor.   ?Cardiovascular:  Positive for chest pain and palpitations.  Negative for orthopnea and PND.  ?Gastrointestinal:  Negative for abdominal pain, blood in stool, constipation, diarrhea, dysphagia, melena and nausea.  ?Genitourinary:  Negative for dysuria, flank pain, frequency, hematuria, urgency and vaginal discharge.  ?Musculoskeletal:  Negative  for arthralgias, back pain, myalgias, muscle weakness and neck pain.  ?Skin:  Negative for rash.  ?Neurological:  Negative for dizziness, weakness and headaches.  ?Hematological:  Negative for adenopathy. Does not bruise/bleed easily.  ?Psychiatric/Behavioral:  Negative for dysphoric mood. The patient is not nervous/anxious.   ? ?Patient Active Problem List  ? Diagnosis Date Noted  ? Spondylolisthesis of lumbar region 06/18/2020  ? Sacroiliitis ([place]) 06/18/2020  ? Status post total knee replacement using cement, left 04/13/2020  ? Esophageal dysphagia 07/04/2019  ? Familial hypercholesterolemia 07/04/2019  ? Gastroesophageal reflux disease 07/04/2019  ? Obesity (BMI 30.0-34.9) 03/13/2018  ? Primary osteoarthritis of left knee 03/13/2018  ? Hyperlipidemia, mixed 04/26/2017  ? Chest pain 04/24/2017  ? Essential hypertension 01/06/2016  ? ? ?Allergies  ?Allergen Reactions  ? Lisinopril Other (See Comments)  ?  Chest pain, dizziness, cough  ? Codeine Hives  ?  "hair crawling" ?  ? Hydrochlorothiazide Anxiety  ?  Jittery feeling ?  ? ? ?Past Surgical History:  ?Procedure Laterality Date  ? BACK SURGERY  2014  ? CYST REMOVAL  ? BREAST BIOPSY Bilateral   ? neg  ? DILATION AND CURETTAGE OF UTERUS    ? TOTAL KNEE ARTHROPLASTY Left 04/13/2020  ? Procedure: TOTAL KNEE ARTHROPLASTY;  Surgeon: Corky Mull, MD;  Location: [facility] ORS;  Service: Orthopedics;  Laterality: Left;  ? TUBAL LIGATION    ? ? ?Social History  ? ?Tobacco Use  ? Smoking status: Never  ? Smokeless tobacco: Never  ?Vaping Use  ? Vaping Use: Never used  ?Substance Use Topics  ? Alcohol use: No  ?  Alcohol/week: 0.0 standard drinks  ? Drug use: No  ? ? ? ?Medication list has been reviewed and updated. ? ?Current Meds  ?Medication Sig  ? amLODipine (NORVASC) 2.5 MG tablet Take 1 tablet (2.5 mg total) by mouth daily.  ? Calcium Carbonate-Vit D-Min (CALCIUM 1200 PO) Take 1 tablet by mouth daily.   ? Lactobacillus-Inulin (CULTURELLE DIGESTIVE HEALTH PO) Take 1  capsule by mouth daily.  ? metoprolol succinate (TOPROL-XL) 25 MG 24 hr tablet Take 1 tablet (25 mg total) by mouth daily.  ? Multiple Vitamins-Minerals (ONE-A-DAY WOMENS 50 PLUS PO) Take 1 tablet by mouth daily.  ? OVER THE COUNTER MEDICATION Take 2 each by mouth daily. Neuriva  ? pantoprazole (PROTONIX) 40 MG tablet Take 1 tablet (40 mg total) by mouth daily.  ? Vitamin D, Cholecalciferol, 25 MCG (1000 UT) TABS Take 1,000 Units by mouth daily.   ? ? ? ?  08/03/2021  ?  7:57 AM 07/28/2020  ?  8:30 AM 01/27/2020  ?  8:36 AM 07/04/2019  ?  8:12 AM  ?GAD 7 : Generalized Anxiety Score  ?Nervous, Anxious, on Edge 1 0 0 0  ?Control/stop worrying 1 0 0 2  ?Worry too much - different things 1 0 0 2  ?Trouble relaxing 2 0 0 0  ?Restless 0 0 0 0  ?Easily annoyed or irritable 3 0 0 0  ?Afraid - awful might happen 1 0 0 0  ?Total GAD 7 Score 9 0 0 4  ?Anxiety Difficulty Somewhat difficult   Not difficult at all  ? ? ? ?  08/03/2021  ?  7:57 AM  ?  Depression screen PHQ 2/9  ?Decreased Interest 0  ?Down, Depressed, Hopeless 0  ?PHQ - 2 Score 0  ?Altered sleeping 0  ?Tired, decreased energy 0  ?Change in appetite 0  ?Feeling bad or failure about yourself  0  ?Trouble concentrating 0  ?Moving slowly or fidgety/restless 0  ?Suicidal thoughts 0  ?PHQ-9 Score 0  ?Difficult doing work/chores Not difficult at all  ? ? ?BP Readings from Last 3 Encounters:  ?08/03/21 120/88  ?07/25/21 122/82  ?04/14/21 (!) 144/80  ? ? ?Physical Exam ?Vitals and nursing note reviewed.  ?Constitutional:   ?   Appearance: She is well-developed.  ?HENT:  ?   Head: Normocephalic.  ?   Right Ear: Tympanic membrane and external ear normal. There is no impacted cerumen.  ?   Left Ear: Tympanic membrane and external ear normal. There is no impacted cerumen.  ?   Nose: Nose normal. No congestion or rhinorrhea.  ?Eyes:  ?   General: Lids are everted, no foreign bodies appreciated. No scleral icterus.    ?   Left eye: No foreign body or hordeolum.  ?   Conjunctiva/sclera:  Conjunctivae normal.  ?   Right eye: Right conjunctiva is not injected.  ?   Left eye: Left conjunctiva is not injected.  ?   Pupils: Pupils are equal, round, and reactive to light.  ?Neck:  ?   Thyroid: No thyro

## 2021-08-04 LAB — LIPID PANEL WITH LDL/HDL RATIO
Cholesterol, Total: 219 mg/dL — ABNORMAL HIGH (ref 100–199)
HDL: 54 mg/dL (ref >39)
LDL Chol Calc (NIH): 142 mg/dL — ABNORMAL HIGH (ref 0–99)
LDL/HDL Ratio: 2.6 ratio (ref 0.0–3.2)
Triglycerides: 131 mg/dL (ref 0–149)
VLDL Cholesterol Cal: 23 mg/dL (ref 5–40)

## 2021-08-04 LAB — RENAL FUNCTION PANEL
Albumin: 4.6 g/dL (ref 3.8–4.8)
BUN/Creatinine Ratio: 25 (ref 12–28)
BUN: 23 mg/dL (ref 8–27)
CO2: 25 mmol/L (ref 20–29)
Calcium: 9.5 mg/dL (ref 8.7–10.3)
Chloride: 106 mmol/L (ref 96–106)
Creatinine, Ser: 0.91 mg/dL (ref 0.57–1.00)
Glucose: 92 mg/dL (ref 70–99)
Phosphorus: 3.9 mg/dL (ref 3.0–4.3)
Potassium: 5.2 mmol/L (ref 3.5–5.2)
Sodium: 144 mmol/L (ref 134–144)
eGFR: 68 mL/min/{1.73_m2} (ref >59)

## 2021-08-13 ENCOUNTER — Emergency Department: Payer: Medicare HMO

## 2021-08-13 ENCOUNTER — Other Ambulatory Visit: Payer: Self-pay

## 2021-08-13 ENCOUNTER — Emergency Department
Admission: EM | Admit: 2021-08-13 | Discharge: 2021-08-13 | Disposition: A | Discharge status: Home / Self Care | Payer: Medicare HMO | Attending: Emergency Medicine | Admitting: Emergency Medicine

## 2021-08-13 DIAGNOSIS — R079 Chest pain, unspecified: Secondary | ICD-10-CM | POA: Diagnosis not present

## 2021-08-13 DIAGNOSIS — I10 Essential (primary) hypertension: Secondary | ICD-10-CM | POA: Insufficient documentation

## 2021-08-13 DIAGNOSIS — R0789 Other chest pain: Secondary | ICD-10-CM | POA: Diagnosis not present

## 2021-08-13 LAB — CBC
HCT: 37.1 % (ref 36.0–46.0)
Hemoglobin: 12.1 g/dL (ref 12.0–15.0)
MCH: 28.5 pg (ref 26.0–34.0)
MCHC: 32.6 g/dL (ref 30.0–36.0)
MCV: 87.3 fL (ref 80.0–100.0)
Platelets: 259 10*3/uL (ref 150–400)
RBC: 4.25 MIL/uL (ref 3.87–5.11)
RDW: 13.2 % (ref 11.5–15.5)
WBC: 6 10*3/uL (ref 4.0–10.5)
nRBC: 0 % (ref 0.0–0.2)

## 2021-08-13 LAB — BASIC METABOLIC PANEL
Anion gap: 8 (ref 5–15)
BUN: 19 mg/dL (ref 8–23)
CO2: 26 mmol/L (ref 22–32)
Calcium: 9.6 mg/dL (ref 8.9–10.3)
Chloride: 107 mmol/L (ref 98–111)
Creatinine, Ser: 0.97 mg/dL (ref 0.44–1.00)
GFR, Estimated: >60 mL/min (ref >60)
Glucose, Bld: 98 mg/dL (ref 70–99)
Potassium: 3.5 mmol/L (ref 3.5–5.1)
Sodium: 141 mmol/L (ref 135–145)

## 2021-08-13 LAB — PROTIME-INR
INR: 0.9 (ref 0.8–1.2)
Prothrombin Time: 12.2 seconds (ref 11.4–15.2)

## 2021-08-13 LAB — TROPONIN I (HIGH SENSITIVITY): Troponin I (High Sensitivity): 5 ng/L (ref <18)

## 2021-08-13 NOTE — ED Provider Notes (Signed)
? ?[facility] ?Provider Note ? ? ? Event Date/Time  ? First MD Initiated Contact with Patient 08/13/21 1839   ?  (approximate) ? ? ?History  ? ?Chief Complaint ?Chest Pain ? ? ?HPI ? ?Lauren Leblanc is a 71 y.o. female with past medical history of hypertension, hyperlipidemia, and GERD who presents to the ED complaining of chest pain.  Patient reports that she has been dealing with intermittent pain in the center of her chest for at least the past few months.  She describes it as a squeezing and pressure that seems to be worse whenever she is under stress.  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  She has some difficulty breathing with the onset of chest pain but denies any recent fevers, cough, pain or swelling in her legs.  She had another episode of pain that was present for much of the day today, eventually decided to take 2 nitroglycerin shortly prior to arrival.  She states that they did not initially seem to help, but pain has since resolved by the time of her arrival to the ED. ?  ? ? ?Physical Exam  ? ?Triage Vital Signs: ?ED Triage Vitals  ?Enc Vitals Group  ?   BP 08/13/21 1843 (!) 178/103  ?   Pulse Rate 08/13/21 1843 60  ?   Resp 08/13/21 1843 18  ?   Temp 08/13/21 1843 98.1 ?F (36.7 ?C)  ?   Temp Source 08/13/21 1843 Oral  ?   SpO2 08/13/21 1843 100 %  ?   Weight 08/13/21 1841 204 lb (92.5 kg)  ?   Height 08/13/21 1841 5\' 5"  (1.651 m)  ?   Head Circumference --   ?   Peak Flow --   ?   Pain Score 08/13/21 1840 7  ?   Pain Loc --   ?   Pain Edu? --   ?   Excl. in GC? --   ? ? ?Most recent vital signs: ?Vitals:  ? 08/13/21 1915 08/13/21 2008  ?BP: (!) 172/77 (!) 164/77  ?Pulse: 60 63  ?Resp:  20  ?Temp:    ?SpO2: 100% 98%  ? ? ?Constitutional: Alert and oriented. ?Eyes: Conjunctivae are normal. ?Head: Atraumatic. ?Nose: No congestion/rhinnorhea. ?Mouth/Throat: Mucous membranes are moist.  ?Cardiovascular: Normal  rate, regular rhythm. Grossly normal heart sounds.  2+ radial pulses bilaterally. ?Respiratory: Normal respiratory effort.  No retractions. Lungs CTAB.  No chest wall tenderness to palpation. ?Gastrointestinal: Soft and nontender. No distention. ?Musculoskeletal: No lower extremity tenderness nor edema.  ?Neurologic:  Normal speech and language. No gross focal neurologic deficits are appreciated. ? ? ? ?ED Results / Procedures / Treatments  ? ?Labs ?(all labs ordered are listed, but only abnormal results are displayed) ?Labs Reviewed  ?BASIC METABOLIC PANEL  ?CBC  ?PROTIME-INR  ?TROPONIN I (HIGH SENSITIVITY)  ? ? ? ?EKG ? ?ED ECG REPORT ?2009, the attending physician, personally viewed and interpreted this ECG. ? ? Date: 08/13/2021 ? EKG Time: 18:49 ? Rate: 62 ? Rhythm: normal sinus rhythm ? Axis: Normal ? Intervals:none ? ST&T Change: None ? ?RADIOLOGY ?Chest x-ray reviewed by me with no infiltrate, edema, or effusion. ? ?PROCEDURES: ? ?Critical Care performed: No ? ?.1-3 Lead EKG Interpretation ?Performed by: 08/15/2021, MD ?Authorized by: Chesley Noon, MD  ? ?  Interpretation: normal   ?  ECG rate:  60-75 ?  ECG rate assessment: normal   ?  Rhythm: sinus rhythm   ?  Ectopy: none   ?  Conduction: normal   ? ? ?MEDICATIONS ORDERED IN ED: ?Medications - No data to display ? ? ?IMPRESSION / MDM / ASSESSMENT AND PLAN / ED COURSE  ?I reviewed the triage vital signs and the nursing notes. ?             ?               ? ?71 y.o. female with past medical history of hypertension, hyperlipidemia, and GERD who presents to the ED complaining of intermittent squeezing and pressure in the center of her chest for the past couple of months, associated with stress. ? ?Differential diagnosis includes, but is not limited to, ACS, PE, pneumonia, pneumothorax, musculoskeletal pain, GERD, anxiety. ? ?Patient is nontoxic-appearing and in no acute distress, vital signs are unremarkable and EKG shows no evidence of  arrhythmia or ischemia.  Symptoms are atypical for ACS and given they have resolved, I doubt PE.  Chest x-ray is unremarkable and labs are also reassuring, troponin within normal limits.  CBC shows no anemia or leukocytosis, BMP without AKI or electrolyte abnormality.  There does appear to be a significant component of stress with her symptoms and given reassuring work-up with chronic symptoms, patient is appropriate for outpatient management.  She has a cardiology appointment coming up next week and was counseled to return to the ED for new worsening symptoms.  Patient agrees with plan. ? ?The patient is on the cardiac monitor to evaluate for evidence of arrhythmia and/or significant heart rate changes. ? ?  ? ? ?FINAL CLINICAL IMPRESSION(S) / ED DIAGNOSES  ? ?Final diagnoses:  ?Nonspecific chest pain  ? ? ? ?Rx / DC Orders  ? ?ED Discharge Orders   ? ? None  ? ?  ? ? ? ?Note:  This document was prepared using Dragon voice recognition software and may include unintentional dictation errors. ?  ?Chesley Noon, MD ?08/13/21 2011 ? ?

## 2021-08-13 NOTE — ED Triage Notes (Signed)
Pt states she has been having cp on and off for the past month- pt states that she took 2 nitros today with little relief- pt states that today was the first time she has had to use the nitro- t states pian goes through to her back ?

## 2021-08-13 NOTE — ED Notes (Signed)
IT ticket placed for phillips monitor that ws unable to pick up on 12 lead or 5 lead. ?

## 2021-08-16 DIAGNOSIS — I208 Other forms of angina pectoris: Secondary | ICD-10-CM | POA: Diagnosis not present

## 2021-08-16 DIAGNOSIS — R0789 Other chest pain: Secondary | ICD-10-CM | POA: Diagnosis not present

## 2021-08-16 DIAGNOSIS — E782 Mixed hyperlipidemia: Secondary | ICD-10-CM | POA: Diagnosis not present

## 2021-08-16 DIAGNOSIS — R9431 Abnormal electrocardiogram [ECG] [EKG]: Secondary | ICD-10-CM | POA: Diagnosis not present

## 2021-08-16 DIAGNOSIS — E7801 Familial hypercholesterolemia: Secondary | ICD-10-CM | POA: Diagnosis not present

## 2021-08-16 DIAGNOSIS — I10 Essential (primary) hypertension: Secondary | ICD-10-CM | POA: Diagnosis not present

## 2021-09-12 ENCOUNTER — Other Ambulatory Visit: Payer: Medicare HMO

## 2021-12-26 ENCOUNTER — Ambulatory Visit
Admission: EM | Admit: 2021-12-26 | Discharge: 2021-12-26 | Disposition: A | Discharge status: Home / Self Care | Payer: Medicare HMO | Attending: Emergency Medicine | Admitting: Emergency Medicine

## 2021-12-26 ENCOUNTER — Ambulatory Visit (INDEPENDENT_AMBULATORY_CARE_PROVIDER_SITE_OTHER): Payer: Medicare HMO

## 2021-12-26 DIAGNOSIS — S060X0A Concussion without loss of consciousness, initial encounter: Secondary | ICD-10-CM

## 2021-12-26 DIAGNOSIS — S0990XA Unspecified injury of head, initial encounter: Secondary | ICD-10-CM | POA: Diagnosis not present

## 2021-12-26 DIAGNOSIS — S20219A Contusion of unspecified front wall of thorax, initial encounter: Secondary | ICD-10-CM

## 2021-12-26 DIAGNOSIS — S299XXA Unspecified injury of thorax, initial encounter: Secondary | ICD-10-CM | POA: Diagnosis not present

## 2021-12-26 DIAGNOSIS — R0689 Other abnormalities of breathing: Secondary | ICD-10-CM | POA: Diagnosis not present

## 2021-12-26 DIAGNOSIS — R079 Chest pain, unspecified: Secondary | ICD-10-CM | POA: Diagnosis not present

## 2021-12-26 DIAGNOSIS — R0600 Dyspnea, unspecified: Secondary | ICD-10-CM | POA: Diagnosis not present

## 2021-12-26 MED ORDER — IBUPROFEN 600 MG PO TABS: 600.0000 mg | ORAL_TABLET | Freq: Four times a day (QID) | ORAL | 0 refills | Status: DC | PRN

## 2021-12-26 MED ORDER — BACLOFEN 10 MG PO TABS: 10.0000 mg | ORAL_TABLET | Freq: Three times a day (TID) | ORAL | 0 refills | Status: DC

## 2021-12-26 NOTE — ED Notes (Signed)
Patient reports a metal farm fence (maybe a 100 lbs per patient) was on [place]cat being moved by spouse.  Fence fell off equipment and toppled towards patient.  Reports fence landed on chest knocking patient backwards on ground. No markings on shirt or chest.  Patient has grass clippings on clothing and in hair.   Patient reports she is breathing shallow due to pain with inspirations.  Skin is warm, clammy.  Denies loc.  Patient has a headache.  Denies neck, back or extremity pain.

## 2021-12-26 NOTE — Discharge Instructions (Addendum)
Your physical exam and x-rays today were all very reassuring.  There is no evidence of broken bones in either your sternum or your ribs.  Also no evidence of bruising to your lung.  Take the ibuprofen, 6 and milligrams every 6 hours with food, as needed for pain in your chest.  You may also use the baclofen, 10 mg every 8 hours, as needed for muscle pain.  You may apply moist heat to your chest wall for 20 minutes at a time 2-3 times a day to help improve blood flow and aid in pain relief.  He needs to take 10 deep breaths every hour to help keep your lungs well inflated and prevent pneumonia formation.  Return for reevaluation if you have any increasing pain, chest pain, or shortness of breath.  Or seek care in the emergency department.
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
## 2021-12-27 ENCOUNTER — Emergency Department: Payer: Medicare HMO

## 2021-12-27 ENCOUNTER — Emergency Department
Admission: EM | Admit: 2021-12-27 | Discharge: 2021-12-28 | Disposition: A | Discharge status: Home / Self Care | Payer: Medicare HMO | Attending: Emergency Medicine | Admitting: Emergency Medicine

## 2021-12-27 DIAGNOSIS — Z96652 Presence of left artificial knee joint: Secondary | ICD-10-CM | POA: Diagnosis not present

## 2021-12-27 DIAGNOSIS — S199XXA Unspecified injury of neck, initial encounter: Secondary | ICD-10-CM | POA: Diagnosis not present

## 2021-12-27 DIAGNOSIS — S0990XA Unspecified injury of head, initial encounter: Secondary | ICD-10-CM | POA: Diagnosis not present

## 2021-12-27 DIAGNOSIS — R519 Headache, unspecified: Secondary | ICD-10-CM | POA: Insufficient documentation

## 2021-12-27 DIAGNOSIS — R112 Nausea with vomiting, unspecified: Secondary | ICD-10-CM | POA: Diagnosis not present

## 2021-12-27 DIAGNOSIS — R42 Dizziness and giddiness: Secondary | ICD-10-CM | POA: Diagnosis not present

## 2021-12-27 DIAGNOSIS — I10 Essential (primary) hypertension: Secondary | ICD-10-CM | POA: Insufficient documentation

## 2021-12-27 LAB — BASIC METABOLIC PANEL
Anion gap: 10 (ref 5–15)
BUN: 21 mg/dL (ref 8–23)
CO2: 24 mmol/L (ref 22–32)
Calcium: 9.4 mg/dL (ref 8.9–10.3)
Chloride: 109 mmol/L (ref 98–111)
Creatinine, Ser: 0.85 mg/dL (ref 0.44–1.00)
GFR, Estimated: >60 mL/min (ref >60)
Glucose, Bld: 95 mg/dL (ref 70–99)
Potassium: 3.8 mmol/L (ref 3.5–5.1)
Sodium: 143 mmol/L (ref 135–145)

## 2021-12-27 LAB — CBC WITH DIFFERENTIAL/PLATELET
Abs Immature Granulocytes: 0.01 10*3/uL (ref 0.00–0.07)
Basophils Absolute: 0 10*3/uL (ref 0.0–0.1)
Basophils Relative: 0 %
Eosinophils Absolute: 0.1 10*3/uL (ref 0.0–0.5)
Eosinophils Relative: 2 %
HCT: 38.7 % (ref 36.0–46.0)
Hemoglobin: 12.6 g/dL (ref 12.0–15.0)
Immature Granulocytes: 0 %
Lymphocytes Relative: 27 %
Lymphs Abs: 1.5 10*3/uL (ref 0.7–4.0)
MCH: 27.9 pg (ref 26.0–34.0)
MCHC: 32.6 g/dL (ref 30.0–36.0)
MCV: 85.8 fL (ref 80.0–100.0)
Monocytes Absolute: 0.2 10*3/uL (ref 0.1–1.0)
Monocytes Relative: 4 %
Neutro Abs: 3.6 10*3/uL (ref 1.7–7.7)
Neutrophils Relative %: 67 %
Platelets: 268 10*3/uL (ref 150–400)
RBC: 4.51 MIL/uL (ref 3.87–5.11)
RDW: 13.4 % (ref 11.5–15.5)
WBC: 5.5 10*3/uL (ref 4.0–10.5)
nRBC: 0 % (ref 0.0–0.2)

## 2021-12-27 LAB — TROPONIN I (HIGH SENSITIVITY): Troponin I (High Sensitivity): 4 ng/L (ref <18)

## 2021-12-27 MED ORDER — IOHEXOL 350 MG/ML SOLN
75.0000 mL | Freq: Once | INTRAVENOUS | Status: AC | PRN
  Administered 2021-12-27: 75 mL via INTRAVENOUS

## 2021-12-27 NOTE — ED Notes (Signed)
Pt back from CT and vitals taken.
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
## 2021-12-27 NOTE — ED Notes (Signed)
Pt brought back to room 12.  This RN assumed care.

## 2021-12-27 NOTE — Discharge Instructions (Signed)
Your CAT scans of your head and neck did not show any injury to your arteries or to your brain.  You can take Tylenol and Motrin for any neck pain.  Please follow-up with your primary care provider.
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
## 2021-12-28 NOTE — ED Notes (Signed)
DC instructions given verbally and in writing...understanding voiced, signature obtained and pt left with spouse-stable condition.

## 2021-12-29 ENCOUNTER — Ambulatory Visit (INDEPENDENT_AMBULATORY_CARE_PROVIDER_SITE_OTHER): Payer: Medicare HMO | Admitting: Family Medicine

## 2021-12-29 ENCOUNTER — Encounter: Payer: Self-pay | Admitting: Family Medicine

## 2021-12-29 VITALS — BP 138/80 | HR 60 | Ht 65.0 in | Wt 206.0 lb

## 2021-12-29 DIAGNOSIS — R69 Illness, unspecified: Secondary | ICD-10-CM | POA: Diagnosis not present

## 2021-12-29 DIAGNOSIS — Z23 Encounter for immunization: Secondary | ICD-10-CM | POA: Diagnosis not present

## 2021-12-29 DIAGNOSIS — F0781 Postconcussional syndrome: Secondary | ICD-10-CM | POA: Diagnosis not present

## 2021-12-29 NOTE — Patient Instructions (Signed)
Post-Concussion Syndrome  A concussion is a brain injury from a direct hit to the head or body. This hit causes the brain to shake quickly back and forth inside the skull. This can damage brain cells and cause chemical changes in the brain. Concussions are usually not life-threatening but can cause serious symptoms. Post-concussion syndrome is when symptoms that occur after a concussion last longer than normal. These symptoms can last from weeks to months. What are the causes? The cause of this condition is not known. It can happen whether your head injury was mild or severe. What increases the risk? You are more likely to develop this condition if: You are female. You are a child, teen, or young adult. You have had a past head injury. You have a history of headaches. You have depression or anxiety. You have loss of consciousness or cannot remember the event (have amnesia of the event). You have multiple symptoms or severe symptoms at the time of your concussion. What are the signs or symptoms? Symptoms of this condition include: Physical symptoms. You may have: Headaches. Tiredness. Dizziness and weakness. Blurry vision and sensitivity to light. Hearing difficulties. Problems with balance. Mental and emotional symptoms. You may have: Memory problems and trouble concentrating. Difficulty sleeping or staying asleep. Feelings of irritability. Anxiety or depression. Difficulty learning new things. How is this diagnosed? This condition may be diagnosed based on: Your symptoms. A description of your injury. Your medical history. Testing your strength, balance, and nerve function (neurological examination). Your health care provider may order other tests, including brain imaging such as a CT scan or an MRI, and memory testing (neuropsychological testing). How is this treated? Treatment for this condition may depend on your symptoms. Symptoms usually go away on their own over time.  Treatments may include: Medicines for headaches, anxiety, depression, and trouble sleeping (insomnia). Resting your brain and body for a few days after your injury. Rehabilitation therapy, such as: Physical or occupational therapy. This may include exercises to help with balance and dizziness. Mental health counseling. A form of talk therapy called cognitive behavioral therapy (CBT) can be especially helpful. This therapy helps you set goals and follow up on the changes that you make. Speech therapy. Vision therapy. A brain and eye specialist can recommend treatments for vision problems. Follow these instructions at home: Medicines Take over-the-counter and prescription medicines only as told by your health care provider. Avoid opioid prescription pain medicines when recovering from a concussion. Activity Limit your mental activities for the first few days after your injury. This may include not doing the following: Homework or job-related work. Complex thinking. Watching TV, and using a computer or phone. Playing memory games and puzzles. Gradually return to your normal activity level. If a certain activity brings on your symptoms, stop or slow down until you can do the activity without it triggering your symptoms. Limit physical activity, such as exercise or sports, for the first few days after a concussion. Gradually return to normal activity as told by your health care provider. Rest. Rest helps your brain heal. Make sure you: Get plenty of sleep at night. Most adults should get at least 7-9 hours of sleep each night. Rest during the day. Take naps or rest breaks when you feel tired. Do not do high-risk activities that could cause a second concussion, such as riding a bike or playing sports. Having another concussion before the first one has healed can be dangerous. General instructions  Do not drink alcohol until your  health care provider says that you can. Keep track of the frequency  and the severity of your symptoms. Give this information to your health care provider. Keep all follow-up visits as directed by your health care provider. This is important. This includes visits with specialists. Contact a health care provider if: Your symptoms do not improve. You have another injury. Get help right away if you: Have a severe or worsening headache. Are confused. Have trouble staying awake. Faint. Vomit. Have weakness or numbness in any part of your body. Have a seizure. Have trouble speaking. Summary Post-concussion syndrome is when symptoms that occur after a concussion last longer than normal. Symptoms usually go away on their own over time. Depending on your symptoms, you may need treatment, such as medicines or rehabilitation therapy. Rest your brain and body for a few days after your injury. Gradually return to normal activities as told by your health care provider. Get plenty of sleep, and avoid alcohol and opioid pain medicines while recovering from a concussion. This information is not intended to replace advice given to you by your health care provider. Make sure you discuss any questions you have with your health care provider. Document Revised: 06/24/2020 Document Reviewed: 06/24/2020 Elsevier Patient Education  2023 [employer] Concussion, Adult  A concussion is a brain injury from a hard, direct hit (trauma) to the head or body. This direct hit causes the brain to shake quickly back and forth inside the skull. This can damage brain cells and cause chemical changes in the brain. A concussion may also be known as a mild traumatic brain injury (TBI). Concussions are usually not life-threatening, but the effects of a concussion can be serious. If you have a concussion, you should be very careful to avoid having a second concussion. What are the causes? This condition is caused by: A direct hit to your head, such as: Running into another player during a  game. Being hit in a fight. Hitting your head on a hard surface. Sudden movement of your body that causes your brain to move back and forth inside the skull, such as in a car crash. What are the signs or symptoms? The signs of a concussion can be hard to notice. Early on, they may be missed by you, family members, and health care providers. You may look fine on the outside but may act or feel differently. Every head injury is different. Symptoms are usually temporary but may last for days, weeks, or even months. Some symptoms appear right away, but other symptoms may not show up for hours or days. If your symptoms last longer than normal, you may have post-concussion syndrome. Physical symptoms Headaches. Dizziness and problems with coordination or balance. Sensitivity to light or noise. Nausea or vomiting. Tiredness (fatigue). Vision or hearing problems. Changes in eating or sleeping patterns. Seizure. Mental and emotional symptoms Irritability or mood changes. Memory problems. Trouble concentrating, organizing, or making decisions. Slowness in thinking, acting or reacting, speaking, or reading. Anxiety or depression. How is this diagnosed? This condition is diagnosed based on: Your symptoms. A description of your injury. You may also have tests, including: Imaging tests, such as a CT scan or an MRI. Neuropsychological tests. These measure your thinking, understanding, learning, and remembering abilities. How is this treated? Treatment for this condition includes: Stopping sports or activity if you are injured. If you hit your head or show signs of concussion: Do not return to sports or activities the same day. Get checked by a  health care provider before you return to your activities. Physical and mental rest and careful observation, usually at home. Gradually return to your normal activities. Medicines to help with symptoms such as headaches, nausea, or difficulty  sleeping. Avoid taking opioid pain medicine while recovering from a concussion. Avoiding alcohol and drugs. These may slow your recovery and can put you at risk of further injury. Referral to a concussion clinic or rehabilitation center. Recovery from a concussion can take time. How fast you recover depends on many factors. Return to activities only when: Your symptoms are completely gone. Your health care provider says that it is safe. Follow these instructions at home: Activity Limit activities that require a lot of thought or concentration, such as: Doing homework or job-related work. Watching TV. Working on the computer or phone. Playing memory games and puzzles. Rest. Rest helps your brain heal. Make sure you: Get plenty of sleep. Most adults should get 7-9 hours of sleep each night. Rest during the day. Take naps or rest breaks when you feel tired. Avoid physical activity like exercise until your health care provider says it is safe. Stop any activity that worsens symptoms. Do not do high-risk activities that could cause a second concussion, such as riding a bike or playing sports. Ask your health care provider when you can return to your normal activities, such as school, work, athletics, and driving. Your ability to react may be slower after a brain injury. Never do these activities if you are dizzy. Your health care provider will likely give you a plan for gradually returning to activities. General instructions  Take over-the-counter and prescription medicines only as told by your health care provider. Some medicines, such as blood thinners (anticoagulants) and aspirin, may increase the risk for complications, such as bleeding. Do not drink alcohol until your health care provider says you can. Watch your symptoms and tell others around you to do the same. Complications sometimes occur after a concussion. Older adults with a brain injury may have a higher risk of serious  complications. Tell your work Freight forwarder, teachers, Government social research officer, school counselor, coach, or Product/process development scientist about your injury, symptoms, and restrictions. Keep all follow-up visits as told by your health care provider. This is important. How is this prevented? Avoiding another brain injury is very important. In rare cases, another injury can lead to permanent brain damage, brain swelling, or death. The risk of this is greatest during the first 7-10 days after a head injury. Avoid injuries by: Stopping activities that could lead to a second concussion, such as contact or recreational sports, until your health care provider says it is okay. Taking these actions once you have returned to sports or activities: Avoiding plays or moves that can cause you to crash into another person. This is how most concussions occur. Following the rules and being respectful of other players. Do not engage in violent or illegal plays. Getting regular exercise that includes strength and balance training. Wearing a properly fitting helmet during sports, biking, or other activities. Helmets can help protect you from serious skull and brain injuries, but they may not protect you from a concussion. Even when wearing a helmet, you should avoid being hit in the head. Contact a health care provider if: Your symptoms do not improve. You have new symptoms. You have another injury. Get help right away if: You have new or worsening physical symptoms, such as: A severe or worsening headache. Weakness or numbness in any part of your body,  slurred speech, vision changes, or confusion. Your coordination gets worse. Vomiting repeatedly. You have a seizure. You have unusual behavior changes. You lose consciousness, are sleepier than normal, or are difficult to wake up. These symptoms may represent a serious problem that is an emergency. Do not wait to see if the symptoms will go away. Get medical help right away. Call your local  emergency services (911 in the [place] Do not drive yourself to the hospital. Summary A concussion is a brain injury that results from a hard, direct hit (trauma) to your head or body. You may have imaging tests and neuropsychological tests to diagnose a concussion. Treatment for this condition includes physical and mental rest and careful observation. Ask your health care provider when you can return to your normal activities, such as school, work, athletics, and driving. Get help right away if you have a severe headache, weakness in any part of the body, seizures, behavior changes, changes in vision, or if you are confused or sleepier than normal. This information is not intended to replace advice given to you by your health care provider. Make sure you discuss any questions you have with your health care provider. Document Revised: 06/24/2020 Document Reviewed: 06/24/2020 Elsevier Patient Education  [place].

## 2021-12-29 NOTE — Progress Notes (Signed)
Date:  12/29/2021   Name:  Lauren Leblanc   DOB:  12/23/1950   MRN:  [id number]   Chief Complaint: Follow-up (ED- not having dizziness or chest pain, but is sore when bends over with movement. All tests were normal including CT of head and CT angio)  HPI  Lab Results  Component Value Date   NA 143 12/27/2021   K 3.8 12/27/2021   CO2 24 12/27/2021   GLUCOSE 95 12/27/2021   BUN 21 12/27/2021   CREATININE 0.85 12/27/2021   CALCIUM 9.4 12/27/2021   EGFR 68 08/03/2021   GFRNONAA >60 12/27/2021   Lab Results  Component Value Date   CHOL 219 (H) 08/03/2021   HDL 54 08/03/2021   LDLCALC 142 (H) 08/03/2021   TRIG 131 08/03/2021   CHOLHDL 5.3 04/25/2017   Lab Results  Component Value Date   TSH 1.359 12/11/2018   No results found for: "HGBA1C" Lab Results  Component Value Date   WBC 5.5 12/27/2021   HGB 12.6 12/27/2021   HCT 38.7 12/27/2021   MCV 85.8 12/27/2021   PLT 268 12/27/2021   Lab Results  Component Value Date   ALT 13 04/02/2020   AST 18 04/02/2020   ALKPHOS 62 04/02/2020   BILITOT 0.7 04/02/2020   No results found for: "25OHVITD2", "25OHVITD3", "VD25OH"   Review of Systems  Patient Active Problem List   Diagnosis Date Noted   Spondylolisthesis of lumbar region 06/18/2020   Sacroiliitis ([place]) 06/18/2020   Status post total knee replacement using cement, left 04/13/2020   Esophageal dysphagia 07/04/2019   Familial hypercholesterolemia 07/04/2019   Gastroesophageal reflux disease 07/04/2019   Obesity (BMI 30.0-34.9) 03/13/2018   Primary osteoarthritis of left knee 03/13/2018   Hyperlipidemia, mixed 04/26/2017   Chest pain 04/24/2017   Essential hypertension 01/06/2016    Allergies  Allergen Reactions   Lisinopril Other (See Comments)    Chest pain, dizziness, cough   Codeine Hives    "hair crawling"    Hydrochlorothiazide Anxiety    Jittery feeling     Past Surgical History:  Procedure Laterality Date   BACK SURGERY  2014   CYST  REMOVAL   BREAST BIOPSY Bilateral    neg   DILATION AND CURETTAGE OF UTERUS     TOTAL KNEE ARTHROPLASTY Left 04/13/2020   Procedure: TOTAL KNEE ARTHROPLASTY;  Surgeon: Corky Mull, MD;  Location: [facility] ORS;  Service: Orthopedics;  Laterality: Left;   TUBAL LIGATION      Social History   Tobacco Use   Smoking status: Never   Smokeless tobacco: Never  Vaping Use   Vaping Use: Never used  Substance Use Topics   Alcohol use: No    Alcohol/week: 0.0 standard drinks of alcohol   Drug use: No     Medication list has been reviewed and updated.  Current Meds  Medication Sig   amLODipine (NORVASC) 2.5 MG tablet Take 1 tablet (2.5 mg total) by mouth daily.   baclofen (LIORESAL) 10 MG tablet Take 1 tablet (10 mg total) by mouth 3 (three) times daily.   Calcium Carbonate-Vit D-Min (CALCIUM 1200 PO) Take 1 tablet by mouth daily.    ibuprofen (ADVIL) 600 MG tablet Take 1 tablet (600 mg total) by mouth every 6 (six) hours as needed.   Lactobacillus-Inulin (CULTURELLE DIGESTIVE HEALTH PO) Take 1 capsule by mouth daily.   metoprolol succinate (TOPROL-XL) 25 MG 24 hr tablet Take 1 tablet (25 mg total) by mouth daily.  Multiple Vitamins-Minerals (ONE-A-DAY WOMENS 50 PLUS PO) Take 1 tablet by mouth daily.   nitroGLYCERIN (NITROSTAT) 0.4 MG SL tablet Place 1 tablet (0.4 mg total) under the tongue every 5 (five) minutes as needed for chest pain.   OVER THE COUNTER MEDICATION Take 2 each by mouth daily. Neuriva   pantoprazole (PROTONIX) 40 MG tablet Take 1 tablet (40 mg total) by mouth daily.   Simethicone (GAS RELIEF 80 PO) Take 2 capsules by mouth daily. otc   Vitamin D, Cholecalciferol, 25 MCG (1000 UT) TABS Take 1,000 Units by mouth daily.        08/03/2021    7:57 AM 07/28/2020    8:30 AM 01/27/2020    8:36 AM 07/04/2019    8:12 AM  GAD 7 : Generalized Anxiety Score  Nervous, Anxious, on Edge 1 0 0 0  Control/stop worrying 1 0 0 2  Worry too much - different things 1 0 0 2  Trouble  relaxing 2 0 0 0  Restless 0 0 0 0  Easily annoyed or irritable 3 0 0 0  Afraid - awful might happen 1 0 0 0  Total GAD 7 Score 9 0 0 4  Anxiety Difficulty Somewhat difficult   Not difficult at all       08/03/2021    7:57 AM 07/25/2021    9:38 AM 07/28/2020    8:30 AM  Depression screen PHQ 2/9  Decreased Interest 0 0 0  Down, Depressed, Hopeless 0 0 0  PHQ - 2 Score 0 0 0  Altered sleeping 0 3 0  Tired, decreased energy 0 0 0  Change in appetite 0 0 0  Feeling bad or failure about yourself  0 0 0  Trouble concentrating 0 0 0  Moving slowly or fidgety/restless 0 0 0  Suicidal thoughts 0 0 0  PHQ-9 Score 0 3 0  Difficult doing work/chores Not difficult at all Not difficult at all     BP Readings from Last 3 Encounters:  12/29/21 138/80  12/28/21 (!) 148/62  12/26/21 (!) 177/82    Physical Exam Vitals and nursing note reviewed. Exam conducted with a chaperone present.  Constitutional:      General: She is not in acute distress.    Appearance: She is not diaphoretic.  HENT:     Head: Normocephalic and atraumatic.     Right Ear: Tympanic membrane and external ear normal.     Left Ear: Tympanic membrane and external ear normal.     Nose: Nose normal.     Mouth/Throat:     Mouth: Mucous membranes are moist.  Eyes:     General:        Right eye: No discharge.        Left eye: No discharge.     Conjunctiva/sclera: Conjunctivae normal.     Pupils: Pupils are equal, round, and reactive to light.  Neck:     Thyroid: No thyromegaly.     Vascular: No JVD.  Cardiovascular:     Rate and Rhythm: Normal rate and regular rhythm.     Heart sounds: Normal heart sounds. No murmur heard.    No friction rub. No gallop.  Pulmonary:     Effort: Pulmonary effort is normal.     Breath sounds: Normal breath sounds. No wheezing or rhonchi.  Abdominal:     General: Bowel sounds are normal.     Palpations: Abdomen is soft. There is no mass.     Tenderness:  There is no abdominal  tenderness. There is no guarding.  Musculoskeletal:        General: Normal range of motion.     Cervical back: Normal range of motion and neck supple.  Lymphadenopathy:     Cervical: No cervical adenopathy.  Skin:    General: Skin is warm and dry.  Neurological:     Mental Status: She is alert.     Cranial Nerves: Cranial nerves 2-12 are intact.     Sensory: Sensation is intact.     Motor: Motor function is intact.     Deep Tendon Reflexes: Reflexes are normal and symmetric.     Reflex Scores:      Bicep reflexes are 2+ on the right side and 2+ on the left side.      Patellar reflexes are 2+ on the right side and 2+ on the left side.    Wt Readings from Last 3 Encounters:  12/29/21 206 lb (93.4 kg)  12/27/21 204 lb (92.5 kg)  12/26/21 209 lb 14.1 oz (95.2 kg)    BP 138/80   Pulse 60   Ht _0  (1.651 m)   Wt 206 lb (93.4 kg)   BMI 34.28 kg/m   Assessment and Plan:  1. Need for immunization against influenza Discussed and administered - Flu Vaccine QUAD High Dose(Fluad)  2. Post concussion syndrome Patient is follow-up from a concussion and is still in postconcussion syndrome with dizziness headache malaise fatigue with 1 episodes of a visual disturbance.  Patient is gradually improving except for occasional headache and we have discussed that it is a gradual process of resolution of her 2 days forward 1 step back and not overdoing it in particular looking at computer screens for hours at a time.  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  We will   Otilio Miu, MD

## 2022-01-23 ENCOUNTER — Ambulatory Visit: Payer: Medicare HMO | Admitting: Dermatology

## 2022-01-23 DIAGNOSIS — L570 Actinic keratosis: Secondary | ICD-10-CM

## 2022-01-23 DIAGNOSIS — L578 Other skin changes due to chronic exposure to nonionizing radiation: Secondary | ICD-10-CM

## 2022-01-23 NOTE — Patient Instructions (Addendum)
Cryotherapy Aftercare  Wash gently with soap and water everyday.   Apply Vaseline and Band-Aid daily until healed.     Due to recent changes in healthcare laws, you may see results of your pathology and/or laboratory studies on MyChart before the doctors have had a chance to review them. We understand that in some cases there may be results that are confusing or concerning to you. Please understand that not all results are received at the same time and often the doctors may need to interpret multiple results in order to provide you with the best plan of care or course of treatment. Therefore, we ask that you please give us 2 business days to thoroughly review all your results before contacting the office for clarification. Should we see a critical lab result, you will be contacted sooner.   If You Need Anything After Your Visit  If you have any questions or concerns for your doctor, please call our main line at [phone] and press option 4 to reach your doctor's medical assistant. If no one answers, please leave a voicemail as directed and we will return your call as soon as possible. Messages left after 4 pm will be answered the following business day.   You may also send us a message via MyChart. We typically respond to MyChart messages within 1-2 business days.  For prescription refills, please ask your pharmacy to contact our office. Our fax number is [phone].  If you have an urgent issue when the clinic is closed that cannot wait until the next business day, you can page your doctor at the number below.    Please note that while we do our best to be available for urgent issues outside of office hours, we are not available 24/7.   If you have an urgent issue and are unable to reach us, you may choose to seek medical care at your doctor's office, retail clinic, urgent care center, or emergency room.  If you have a medical emergency, please immediately call 911 or go to the  emergency department.  Pager Numbers  - Dr. Kowalski: [phone]  - Dr. Moye: [phone]  - Dr. Stewart: [phone]  In the event of inclement weather, please call our main line at [phone] for an update on the status of any delays or closures.  Dermatology Medication Tips: Please keep the boxes that topical medications come in in order to help keep track of the instructions about where and how to use these. Pharmacies typically print the medication instructions only on the boxes and not directly on the medication tubes.   If your medication is too expensive, please contact our office at [phone] option 4 or send us a message through MyChart.   We are unable to tell what your co-pay for medications will be in advance as this is different depending on your insurance coverage. However, we may be able to find a substitute medication at lower cost or fill out paperwork to get insurance to cover a needed medication.   If a prior authorization is required to get your medication covered by your insurance company, please allow us 1-2 business days to complete this process.  Drug prices often vary depending on where the prescription is filled and some pharmacies may offer cheaper prices.  The website www.goodrx.com contains coupons for medications through different pharmacies. The prices here do not account for what the cost may be with help from insurance (it may be cheaper with your insurance), but the website can   give you the price if you did not use any insurance.  - You can print the associated coupon and take it with your prescription to the pharmacy.  - You may also stop by our office during regular business hours and pick up a GoodRx coupon card.  - If you need your prescription sent electronically to a different pharmacy, notify our office through  MyChart or by phone at [phone] option 4.     Si Usted Necesita Algo Despus de Su Visita  Tambin puede  enviarnos un mensaje a travs de MyChart. Por lo general respondemos a los mensajes de MyChart en el transcurso de 1 a 2 das hbiles.  Para renovar recetas, por favor pida a su farmacia que se ponga en contacto con nuestra oficina. Nuestro nmero de fax es el [phone].  Si tiene un asunto urgente cuando la clnica est cerrada y que no puede esperar hasta el siguiente da hbil, puede llamar/localizar a su doctor(a) al nmero que aparece a continuacin.   Por favor, tenga en cuenta que aunque hacemos todo lo posible para estar disponibles para asuntos urgentes fuera del horario de oficina, no estamos disponibles las 24 horas del da, los 7 das de la semana.   Si tiene un problema urgente y no puede comunicarse con nosotros, puede optar por buscar atencin mdica  en el consultorio de su doctor(a), en una clnica privada, en un centro de atencin urgente o en una sala de emergencias.  Si tiene una emergencia mdica, por favor llame inmediatamente al 911 o vaya a la sala de emergencias.  Nmeros de bper  - Dr. Kowalski: [phone]  - Dra. Moye: [phone]  - Dra. Stewart: [phone]  En caso de inclemencias del tiempo, por favor llame a nuestra lnea principal al [phone] para una actualizacin sobre el estado de cualquier retraso o cierre.  Consejos para la medicacin en dermatologa: Por favor, guarde las cajas en las que vienen los medicamentos de uso tpico para ayudarle a seguir las instrucciones sobre dnde y cmo usarlos. Las farmacias generalmente imprimen las instrucciones del medicamento slo en las cajas y no directamente en los tubos del medicamento.   Si su medicamento es muy caro, por favor, pngase en contacto con nuestra oficina llamando al [phone] y presione la opcin 4 o envenos un mensaje a travs de MyChart.   No podemos decirle cul ser su copago por los medicamentos por adelantado ya que esto es diferente dependiendo de la cobertura de su seguro.  Sin embargo, es posible que podamos encontrar un medicamento sustituto a menor costo o llenar un formulario para que el seguro cubra el medicamento que se considera necesario.   Si se requiere una autorizacin previa para que su compaa de seguros cubra su medicamento, por favor permtanos de 1 a 2 das hbiles para completar este proceso.  Los precios de los medicamentos varan con frecuencia dependiendo del lugar de dnde se surte la receta y alguna farmacias pueden ofrecer precios ms baratos.  El sitio web www.goodrx.com tiene cupones para medicamentos de diferentes farmacias. Los precios aqu no tienen en cuenta lo que podra costar con la ayuda del seguro (puede ser ms barato con su seguro), pero el sitio web puede darle el precio si no utiliz ningn seguro.  - Puede imprimir el cupn correspondiente y llevarlo con su receta a la farmacia.  - Tambin puede pasar por nuestra oficina durante el horario de atencin regular y recoger una tarjeta de cupones de GoodRx.  -   Si necesita que su receta se enve electrnicamente a una farmacia diferente, informe a nuestra oficina a travs de MyChart de [place] o por telfono llamando al [phone] y presione la opcin 4.  

## 2022-01-23 NOTE — Progress Notes (Signed)
   New Patient Visit  Subjective  Lauren Leblanc is a 71 y.o. female who presents for the following: Skin Problem. The patient has lesions on her face to be evaluated, some may be new or changing and the patient has concerns that these could be cancer.   The following portions of the chart were reviewed this encounter and updated as appropriate:   Tobacco  Allergies  Meds  Problems  Med Hx  Surg Hx  Fam Hx     Review of Systems:  No other skin or systemic complaints except as noted in HPI or Assessment and Plan.  Objective  Well appearing patient in no apparent distress; mood and affect are within normal limits.  A focused examination was performed including face. Relevant physical exam findings are noted in the Assessment and Plan.  right temple x 1, right infraorbitial x 1. right upper lip x 2  (4) (4) Erythematous thin papules/macules with gritty scale.    Assessment & Plan  AK (actinic keratosis) (4) right temple x 1, right infraorbitial x 1. right upper lip x 2  (4)  Actinic keratoses are precancerous spots that appear secondary to cumulative UV radiation exposure/sun exposure over time. They are chronic with expected duration over 1 year. A portion of actinic keratoses will progress to squamous cell carcinoma of the skin. It is not possible to reliably predict which spots will progress to skin cancer and so treatment is recommended to prevent development of skin cancer.  Recommend daily broad spectrum sunscreen SPF 30+ to sun-exposed areas, reapply every 2 hours as needed.  Recommend staying in the shade or wearing long sleeves, sun glasses (UVA+UVB protection) and wide brim hats (4-inch brim around the entire circumference of the hat). Call for new or changing lesions.   Destruction of lesion - right temple x 1, right infraorbitial x 1. right upper lip x 2  (4) Complexity: simple   Destruction method: cryotherapy   Informed consent: discussed and consent obtained    Timeout:  patient name, date of birth, surgical site, and procedure verified Lesion destroyed using liquid nitrogen: Yes   Region frozen until ice ball extended beyond lesion: Yes   Outcome: patient tolerated procedure well with no complications   Post-procedure details: wound care instructions given    Actinic Damage - chronic, secondary to cumulative UV radiation exposure/sun exposure over time - diffuse scaly erythematous macules with underlying dyspigmentation - Recommend daily broad spectrum sunscreen SPF 30+ to sun-exposed areas, reapply every 2 hours as needed.  - Recommend staying in the shade or wearing long sleeves, sun glasses (UVA+UVB protection) and wide brim hats (4-inch brim around the entire circumference of the hat). - Call for new or changing lesions.  Return in about 4 months (around 05/26/2022) for Aks .  IMarye Round, CMA, am acting as scribe for Sarina Ser, MD .  Documentation: I have reviewed the above documentation for accuracy and completeness, and I agree with the above.  Sarina Ser, MD

## 2022-01-27 ENCOUNTER — Encounter: Payer: Self-pay | Admitting: Dermatology

## 2022-02-03 ENCOUNTER — Encounter: Payer: Self-pay | Admitting: Family Medicine

## 2022-02-03 ENCOUNTER — Ambulatory Visit (INDEPENDENT_AMBULATORY_CARE_PROVIDER_SITE_OTHER): Payer: Medicare HMO | Admitting: Family Medicine

## 2022-02-03 VITALS — BP 136/96 | HR 72 | Ht 65.0 in | Wt 201.0 lb

## 2022-02-03 DIAGNOSIS — K219 Gastro-esophageal reflux disease without esophagitis: Secondary | ICD-10-CM

## 2022-02-03 DIAGNOSIS — E7801 Familial hypercholesterolemia: Secondary | ICD-10-CM | POA: Diagnosis not present

## 2022-02-03 DIAGNOSIS — Z23 Encounter for immunization: Secondary | ICD-10-CM

## 2022-02-03 DIAGNOSIS — I10 Essential (primary) hypertension: Secondary | ICD-10-CM | POA: Diagnosis not present

## 2022-02-03 MED ORDER — METOPROLOL SUCCINATE ER 25 MG PO TB24: 25.0000 mg | ORAL_TABLET | Freq: Every day | ORAL | 1 refills | Status: DC

## 2022-02-03 MED ORDER — PANTOPRAZOLE SODIUM 40 MG PO TBEC: 40.0000 mg | DELAYED_RELEASE_TABLET | Freq: Every day | ORAL | 1 refills | Status: DC

## 2022-02-03 MED ORDER — AMLODIPINE BESYLATE 5 MG PO TABS: 5.0000 mg | ORAL_TABLET | Freq: Every day | ORAL | 1 refills | Status: DC

## 2022-02-03 NOTE — Progress Notes (Signed)
Date:  02/03/2022   Name:  Lauren Leblanc   DOB:  18-Aug-1950   MRN:  [id number]   Chief Complaint: Hypertension, Gastroesophageal Reflux, elevated LDL, and pneumonia vacc  Hypertension Pertinent negatives include no chest pain, headaches, neck pain, palpitations or shortness of breath.  Gastroesophageal Reflux She reports no abdominal pain, no belching, no chest pain, no choking, no coughing, no dysphagia, no early satiety, no globus sensation, no heartburn, no hoarse voice, no nausea, no sore throat, no stridor, no tooth decay, no water brash or no wheezing. This is a chronic problem. The current episode started more than 1 year ago. The problem has been gradually improving. The symptoms are aggravated by certain foods. There are no known risk factors. She has tried a PPI for the symptoms.    Lab Results  Component Value Date   NA 143 12/27/2021   K 3.8 12/27/2021   CO2 24 12/27/2021   GLUCOSE 95 12/27/2021   BUN 21 12/27/2021   CREATININE 0.85 12/27/2021   CALCIUM 9.4 12/27/2021   EGFR 68 08/03/2021   GFRNONAA >60 12/27/2021   Lab Results  Component Value Date   CHOL 219 (H) 08/03/2021   HDL 54 08/03/2021   LDLCALC 142 (H) 08/03/2021   TRIG 131 08/03/2021   CHOLHDL 5.3 04/25/2017   Lab Results  Component Value Date   TSH 1.359 12/11/2018   No results found for: "HGBA1C" Lab Results  Component Value Date   WBC 5.5 12/27/2021   HGB 12.6 12/27/2021   HCT 38.7 12/27/2021   MCV 85.8 12/27/2021   PLT 268 12/27/2021   Lab Results  Component Value Date   ALT 13 04/02/2020   AST 18 04/02/2020   ALKPHOS 62 04/02/2020   BILITOT 0.7 04/02/2020   No results found for: "25OHVITD2", "25OHVITD3", "VD25OH"   Review of Systems  Constitutional:  Negative for chills and fever.  HENT:  Negative for drooling, ear discharge, ear pain, hoarse voice and sore throat.   Respiratory:  Negative for cough, choking, shortness of breath and wheezing.   Cardiovascular:  Negative  for chest pain, palpitations and leg swelling.  Gastrointestinal:  Negative for abdominal pain, blood in stool, constipation, diarrhea, dysphagia, heartburn and nausea.  Endocrine: Negative for polydipsia.  Genitourinary:  Negative for dysuria, frequency, hematuria and urgency.  Musculoskeletal:  Negative for back pain, myalgias and neck pain.  Skin:  Negative for rash.  Allergic/Immunologic: Negative for environmental allergies.  Neurological:  Negative for dizziness and headaches.  Hematological:  Does not bruise/bleed easily.  Psychiatric/Behavioral:  Negative for suicidal ideas. The patient is not nervous/anxious.     Patient Active Problem List   Diagnosis Date Noted   Spondylolisthesis of lumbar region 06/18/2020   Sacroiliitis ([place]) 06/18/2020   Status post total knee replacement using cement, left 04/13/2020   Esophageal dysphagia 07/04/2019   Familial hypercholesterolemia 07/04/2019   Gastroesophageal reflux disease 07/04/2019   Obesity (BMI 30.0-34.9) 03/13/2018   Primary osteoarthritis of left knee 03/13/2018   Hyperlipidemia, mixed 04/26/2017   Chest pain 04/24/2017   Essential hypertension 01/06/2016    Allergies  Allergen Reactions   Lisinopril Other (See Comments)    Chest pain, dizziness, cough   Codeine Hives    "hair crawling"    Hydrochlorothiazide Anxiety    Jittery feeling     Past Surgical History:  Procedure Laterality Date   BACK SURGERY  2014   CYST REMOVAL   BREAST BIOPSY Bilateral    neg  DILATION AND CURETTAGE OF UTERUS     TOTAL KNEE ARTHROPLASTY Left 04/13/2020   Procedure: TOTAL KNEE ARTHROPLASTY;  Surgeon: Corky Mull, MD;  Location: [facility] ORS;  Service: Orthopedics;  Laterality: Left;   TUBAL LIGATION      Social History   Tobacco Use   Smoking status: Never   Smokeless tobacco: Never  Vaping Use   Vaping Use: Never used  Substance Use Topics   Alcohol use: No    Alcohol/week: 0.0 standard drinks of alcohol   Drug use: No      Medication list has been reviewed and updated.  Current Meds  Medication Sig   amLODipine (NORVASC) 2.5 MG tablet Take 1 tablet (2.5 mg total) by mouth daily.   Calcium Carbonate-Vit D-Min (CALCIUM 1200 PO) Take 1 tablet by mouth daily.    Lactobacillus-Inulin (CULTURELLE DIGESTIVE HEALTH PO) Take 1 capsule by mouth daily.   metoprolol succinate (TOPROL-XL) 25 MG 24 hr tablet Take 1 tablet (25 mg total) by mouth daily.   Multiple Vitamins-Minerals (ONE-A-DAY WOMENS 50 PLUS PO) Take 1 tablet by mouth daily.   nitroGLYCERIN (NITROSTAT) 0.4 MG SL tablet Place 1 tablet (0.4 mg total) under the tongue every 5 (five) minutes as needed for chest pain.   OVER THE COUNTER MEDICATION Take 2 each by mouth daily. Neuriva   pantoprazole (PROTONIX) 40 MG tablet Take 1 tablet (40 mg total) by mouth daily.   Vitamin D, Cholecalciferol, 25 MCG (1000 UT) TABS Take 1,000 Units by mouth daily.    [DISCONTINUED] baclofen (LIORESAL) 10 MG tablet Take 1 tablet (10 mg total) by mouth 3 (three) times daily.       02/03/2022    9:18 AM 08/03/2021    7:57 AM 07/28/2020    8:30 AM 01/27/2020    8:36 AM  GAD 7 : Generalized Anxiety Score  Nervous, Anxious, on Edge 0 1 0 0  Control/stop worrying 0 1 0 0  Worry too much - different things 0 1 0 0  Trouble relaxing 0 2 0 0  Restless 0 0 0 0  Easily annoyed or irritable 0 3 0 0  Afraid - awful might happen 0 1 0 0  Total GAD 7 Score 0 9 0 0  Anxiety Difficulty Not difficult at all Somewhat difficult         02/03/2022    9:18 AM 08/03/2021    7:57 AM 07/25/2021    9:38 AM  Depression screen PHQ 2/9  Decreased Interest 0 0 0  Down, Depressed, Hopeless 0 0 0  PHQ - 2 Score 0 0 0  Altered sleeping 0 0 3  Tired, decreased energy 0 0 0  Change in appetite 0 0 0  Feeling bad or failure about yourself  0 0 0  Trouble concentrating 0 0 0  Moving slowly or fidgety/restless 0 0 0  Suicidal thoughts 0 0 0  PHQ-9 Score 0 0 3  Difficult doing work/chores Not  difficult at all Not difficult at all Not difficult at all    BP Readings from Last 3 Encounters:  02/03/22 (!) 136/90  12/29/21 138/80  12/28/21 (!) 148/62    Physical Exam Vitals and nursing note reviewed. Exam conducted with a chaperone present.  Constitutional:      General: She is not in acute distress.    Appearance: She is well-developed. She is not diaphoretic.  HENT:     Head: Normocephalic and atraumatic.     Right Ear: External ear  normal.     Left Ear: External ear normal.     Nose: Nose normal.  Eyes:     General: Lids are everted, no foreign bodies appreciated. No scleral icterus.       Right eye: No discharge.        Left eye: No foreign body, discharge or hordeolum.     Conjunctiva/sclera: Conjunctivae normal.     Right eye: Right conjunctiva is not injected.     Left eye: Left conjunctiva is not injected.     Pupils: Pupils are equal, round, and reactive to light.  Neck:     Thyroid: No thyromegaly.     Vascular: No JVD.     Trachea: No tracheal deviation.  Cardiovascular:     Rate and Rhythm: Normal rate and regular rhythm.     Heart sounds: Normal heart sounds. No murmur heard.    No friction rub. No gallop.  Pulmonary:     Effort: Pulmonary effort is normal. No respiratory distress.     Breath sounds: Normal breath sounds. No wheezing or rales.  Abdominal:     General: Bowel sounds are normal.     Palpations: Abdomen is soft. There is no mass.     Tenderness: There is no abdominal tenderness. There is no guarding or rebound.  Musculoskeletal:        General: No tenderness. Normal range of motion.     Cervical back: Normal range of motion and neck supple.  Lymphadenopathy:     Cervical: No cervical adenopathy.  Skin:    General: Skin is warm and dry.     Findings: No rash.  Neurological:     Mental Status: She is alert and oriented to person, place, and time.     Cranial Nerves: No cranial nerve deficit.     Deep Tendon Reflexes: Reflexes are  normal and symmetric. Reflexes normal.  Psychiatric:        Mood and Affect: Mood is not anxious or depressed.     Wt Readings from Last 3 Encounters:  02/03/22 201 lb (91.2 kg)  12/29/21 206 lb (93.4 kg)  12/27/21 204 lb (92.5 kg)    BP (!) 136/90   Pulse 72   Ht _0  (1.651 m)   Wt 201 lb (91.2 kg)   BMI 33.45 kg/m   Assessment and Plan: 1. Essential hypertension Chronic.  Uncontrolled.  Stable.  Medications improving but still has some elevation in the diastolic range.  Blood pressure today is 136/96.  Patient is going to double up on her amlodipine to 5 mg and I have called in 5 mg once a day and we will recheck in 90 to 180 days.  In the meantime we will also continue the metoprolol XL 25 mg once a day. - metoprolol succinate (TOPROL-XL) 25 MG 24 hr tablet; Take 1 tablet (25 mg total) by mouth daily.  dispense: 90 tablet; Refill: 1  2. Gastroesophageal reflux disease, unspecified whether esophagitis present Chronic.  Controlled.  Stable.  Continue pantoprazole 40 mg once a day. - pantoprazole (PROTONIX) 40 MG tablet; Take 1 tablet (40 mg total) by mouth daily.  Dispense: 90 tablet; Refill: 1  3. Need for pneumococcal vaccination Discussed and administered. - Pneumococcal conjugate vaccine 20-valent (Prevnar 20)  4. Familial hypercholesterolemia Chronic.  Controlled.  Stable.  Currently diet controlled.  Patient is followed by cardiology and we are trying to maximize weight loss maximize cholesterol control maximize hypertension and increasing exercise to reduce blood  pressure and heart disease risk.  We will check direct LDL and if elevated consider medical intervention. - Direct LDL     Otilio Miu, MD

## 2022-02-04 LAB — LDL CHOLESTEROL, DIRECT: LDL Direct: 122 mg/dL — ABNORMAL HIGH (ref 0–99)

## 2022-02-06 DIAGNOSIS — Z789 Other specified health status: Secondary | ICD-10-CM | POA: Insufficient documentation

## 2022-02-13 ENCOUNTER — Ambulatory Visit
Admission: EM | Admit: 2022-02-13 | Discharge: 2022-02-13 | Disposition: A | Discharge status: Home / Self Care | Payer: Medicare HMO | Attending: Urgent Care | Admitting: Urgent Care

## 2022-02-13 ENCOUNTER — Other Ambulatory Visit: Payer: Self-pay

## 2022-02-13 DIAGNOSIS — M7918 Myalgia, other site: Secondary | ICD-10-CM | POA: Diagnosis not present

## 2022-02-13 DIAGNOSIS — M5413 Radiculopathy, cervicothoracic region: Secondary | ICD-10-CM | POA: Diagnosis not present

## 2022-02-13 MED ORDER — TIZANIDINE HCL 4 MG PO TABS: 4.0000 mg | ORAL_TABLET | Freq: Four times a day (QID) | ORAL | 0 refills | Status: DC | PRN

## 2022-02-13 MED ORDER — DICLOFENAC SODIUM 75 MG PO TBEC: 75.0000 mg | DELAYED_RELEASE_TABLET | Freq: Two times a day (BID) | ORAL | 0 refills | Status: DC

## 2022-02-13 NOTE — Discharge Instructions (Addendum)
Your pain seems to be stemming from very tense and knotted traps and scalene muscles on the left. This can cause inflammation of the tissues surrounding your brachial plexus, which puts pressure on your nerves. Please purchase a microwavable heat pack and apply moist heat to the affected area several times daily.  You can also purchase "Warmies" which have essential oils infused in them. Please take the diclofenac twice daily with food.  You cannot take any additional over-the-counter anti-inflammatory such as Advil, Motrin, ibuprofen. Use the tizanidine every 6 hours as needed.  This should help relax the muscles.  This may make you drowsy so take with caution. Please discuss a referral for physical therapy to receive dry needling.  Ultimately, this is the best treatment for your severe myofascial pain.

## 2022-02-13 NOTE — ED Provider Notes (Signed)
[facility]    CSN: [id number] Arrival date & time: 02/13/22  1037      History   Chief Complaint Chief Complaint  Patient presents with   Shoulder Pain    HPI Lauren Leblanc is a 71 y.o. female.   Pleasant 71 year old female presents today due to concerns of shoulder and neck pain.  She states she had a farm related injury beginning of September and had a full work-up for this both here and in ER.  States however that her neck and shoulder pain predated this injury.  She describes the pain as primarily being to her left lateral neck, and left trap.  States that it is exquisitely tender to touch.  She reports a sharp burning pain extending down her arm, but it does not extend into her forearm or hand.  She has been on gabapentin in the past for back issues, and states she cannot tolerate as it is too strong.  She is currently wearing a Salonpas patch, and been taking ibuprofen.  She states the pain kept her up at night the past few days.  She denies decrease in range of motion, but certain movements do seem to increase the discomfort.  No swelling bruising or erythema skin.   Shoulder Pain   Past Medical History:  Diagnosis Date   Angina at rest    Arthritis    GERD (gastroesophageal reflux disease)    Hypertension     Patient Active Problem List   Diagnosis Date Noted   Statin intolerance 02/06/2022   Spondylolisthesis of lumbar region 06/18/2020   Sacroiliitis (HCC) 06/18/2020   Status post total knee replacement using cement, left 04/13/2020   Esophageal dysphagia 07/04/2019   Familial hypercholesterolemia 07/04/2019   Gastroesophageal reflux disease 07/04/2019   Obesity (BMI 30.0-34.9) 03/13/2018   Primary osteoarthritis of left knee 03/13/2018   Hyperlipidemia, mixed 04/26/2017   Chest pain 04/24/2017   Essential hypertension 01/06/2016    Past Surgical History:  Procedure Laterality Date   BACK SURGERY  2014   CYST REMOVAL   BREAST BIOPSY  Bilateral    neg   DILATION AND CURETTAGE OF UTERUS     TOTAL KNEE ARTHROPLASTY Left 04/13/2020   Procedure: TOTAL KNEE ARTHROPLASTY;  Surgeon: Christena Flake, MD;  Location: [facility] ORS;  Service: Orthopedics;  Laterality: Left;   TUBAL LIGATION      OB History   No obstetric history on file.      Home Medications    Prior to Admission medications   Medication Sig Start Date End Date Taking? Authorizing Provider  amLODipine (NORVASC) 5 MG tablet Take 1 tablet (5 mg total) by mouth daily. 02/03/22  Yes Duanne Limerick, MD  Calcium Carbonate-Vit D-Min (CALCIUM 1200 PO) Take 1 tablet by mouth daily.    Yes [provider]  diclofenac (VOLTAREN) 75 MG EC tablet Take 1 tablet (75 mg total) by mouth 2 (two) times daily with a meal. 02/13/22  Yes Roiza Wiedel L, PA  ibuprofen (ADVIL) 600 MG tablet Take 1 tablet (600 mg total) by mouth every 6 (six) hours as needed. 12/26/21  Yes Becky Augusta, NP  Lactobacillus-Inulin (CULTURELLE DIGESTIVE HEALTH PO) Take 1 capsule by mouth daily.   Yes [provider]  metoprolol succinate (TOPROL-XL) 25 MG 24 hr tablet Take 1 tablet (25 mg total) by mouth daily. 02/03/22  Yes Duanne Limerick, MD  Multiple Vitamins-Minerals (ONE-A-DAY WOMENS 50 PLUS PO) Take 1 tablet by mouth daily.  Yes [provider]  nitroGLYCERIN (NITROSTAT) 0.4 MG SL tablet Place 1 tablet (0.4 mg total) under the tongue every 5 (five) minutes as needed for chest pain. 08/03/21  Yes Juline Patch, MD  OVER THE COUNTER MEDICATION Take 2 each by mouth daily. Neuriva   Yes [provider]  pantoprazole (PROTONIX) 40 MG tablet Take 1 tablet (40 mg total) by mouth daily. 02/03/22  Yes Juline Patch, MD  tiZANidine (ZANAFLEX) 4 MG tablet Take 1 tablet (4 mg total) by mouth every 6 (six) hours as needed for muscle spasms. 02/13/22  Yes Charle Mclaurin L, PA  Vitamin D, Cholecalciferol, 25 MCG (1000 UT) TABS Take 1,000 Units by mouth daily.    Yes [provider]    Family History Family History  Problem Relation Age of Onset   Coronary artery disease Mother 20   Hypertension Mother    Stroke Father    Diabetes Father    Hypertension Sister    Allergies Sister    Breast cancer Maternal Aunt        3 mat aunts   Breast cancer Maternal Aunt    Breast cancer Maternal Aunt     Social History Social History   Tobacco Use   Smoking status: Never   Smokeless tobacco: Never  Vaping Use   Vaping Use: Never used  Substance Use Topics   Alcohol use: No    Alcohol/week: 0.0 standard drinks of alcohol   Drug use: No     Allergies   Lisinopril, Codeine, and Hydrochlorothiazide   Review of Systems Review of Systems As per HPI  Physical Exam Triage Vital Signs ED Triage Vitals  Enc Vitals Group     BP 02/13/22 1118 (!) 160/90     Pulse Rate 02/13/22 1118 61     Resp 02/13/22 1118 18     Temp 02/13/22 1118 97.7 F (36.5 C)     Temp Source 02/13/22 1118 Oral     SpO2 02/13/22 1118 98 %     Weight 02/13/22 1115 198 lb (89.8 kg)     Height 02/13/22 1115 5\' 5"  (1.651 m)     Head Circumference --      Peak Flow --      Pain Score 02/13/22 1115 8     Pain Loc --      Pain Edu? --      Excl. in [place]? --    No data found.  Updated Vital Signs BP (!) 160/90 (BP Location: Left Arm)   Pulse 61   Temp 97.7 F (36.5 C) (Oral)   Resp 18   Ht 5\' 5"  (1.651 m)   Wt 198 lb (89.8 kg)   SpO2 98%   BMI 32.95 kg/m   Visual Acuity Right Eye Distance:   Left Eye Distance:   Bilateral Distance:    Right Eye Near:   Left Eye Near:    Bilateral Near:     Physical Exam Vitals and nursing note reviewed.  Constitutional:      Appearance: Normal appearance. She is obese. She is not ill-appearing, toxic-appearing or diaphoretic.     Comments: NAD, but uncomfortable sitting in chair, L shoulder rolled anteriorly  HENT:     Head: Normocephalic and atraumatic.     Right Ear: External ear normal.     Left Ear: External ear  normal.  Cardiovascular:     Rate and Rhythm: Normal rate and regular rhythm.     Pulses:  Normal pulses.     Heart sounds: No murmur heard. Pulmonary:     Effort: Pulmonary effort is normal. No respiratory distress.     Breath sounds: Normal breath sounds. No stridor. No wheezing.  Musculoskeletal:        General: Tenderness (exaggerated pain response to minimal palpation of L trap/ scalenes) present. No swelling, deformity or signs of injury. Normal range of motion.     Right shoulder: Normal. No swelling, deformity, effusion, laceration, tenderness, bony tenderness or crepitus. Normal range of motion. Normal strength. Normal pulse.     Left shoulder: Tenderness present. No swelling, deformity, effusion, laceration, bony tenderness or crepitus. Normal range of motion. Normal strength. Normal pulse.     Right upper arm: Normal. No swelling, edema, deformity, lacerations, tenderness or bony tenderness.     Left upper arm: Normal. No swelling, edema, deformity, lacerations, tenderness or bony tenderness.     Right elbow: Normal.     Left elbow: Normal.     Right wrist: Normal.     Left wrist: Normal.     Right hand: Normal. No tenderness or bony tenderness. Normal range of motion. Normal strength. Normal sensation. There is no disruption of two-point discrimination. Normal capillary refill.     Left hand: Normal. No tenderness or bony tenderness. Normal range of motion. Normal strength. Normal sensation. There is no disruption of two-point discrimination. Normal capillary refill.       Arms:     Cervical back: Normal range of motion and neck supple. Tenderness (very tense, knotted scalenes to the L. No step off deformity or bony tenderness) present. No rigidity.     Right lower leg: No edema.     Left lower leg: No edema.  Lymphadenopathy:     Cervical: No cervical adenopathy.  Skin:    General: Skin is warm and dry.     Capillary Refill: Capillary refill takes less than 2 seconds.      Coloration: Skin is not jaundiced.     Findings: No bruising, erythema or rash.  Neurological:     Mental Status: She is alert.      UC Treatments / Results  Labs (all labs ordered are listed, but only abnormal results are displayed) Labs Reviewed - No data to display  EKG   Radiology No results found.  Procedures Procedures (including critical care time)  Medications Ordered in UC Medications - No data to display  Initial Impression / Assessment and Plan / UC Course  I have reviewed the triage vital signs and the nursing notes.  Pertinent labs & imaging results that were available during my care of the patient were reviewed by me and considered in my medical decision making (see chart for details).     Radiculopathy of L cervicothoracic region -due to no acute injury, I do not feel that x-ray would be beneficial at this time.  Suspect this to be due to muscular inflammation causing radiculitis of the brachial plexus.  Patient had extremely reproducible tenderness to palpation of the scalene and trap on the left.  We will do short course of diclofenac, patient to stop her oral NSAID at home.  Also as needed tizanidine.  Moist heat and massage would be beneficial.  Consider physical therapy referral for dry needling if ineffective. Acute myofascial pain -as above.  Consider x-ray should symptoms fail to improve.  Dry needling would likely be beneficial.  Discussed possible use of gabapentin for nerve pain, patient deferred.   Final  Clinical Impressions(s) / UC Diagnoses   Final diagnoses:  Radiculopathy of cervicothoracic region  Acute myofascial pain     Discharge Instructions      Your pain seems to be stemming from very tense and knotted traps and scalene muscles on the left. This can cause inflammation of the tissues surrounding your brachial plexus, which puts pressure on your nerves. Please purchase a microwavable heat pack and apply moist heat to the affected  area several times daily.  You can also purchase "Warmies" which have essential oils infused in them. Please take the diclofenac twice daily with food.  You cannot take any additional over-the-counter anti-inflammatory such as Advil, Motrin, ibuprofen. Use the tizanidine every 6 hours as needed.  This should help relax the muscles.  This may make you drowsy so take with caution. Please discuss a referral for physical therapy to receive dry needling.  Ultimately, this is the best treatment for your severe myofascial pain.    ED Prescriptions     Medication Sig Dispense Auth. Provider   tiZANidine (ZANAFLEX) 4 MG tablet Take 1 tablet (4 mg total) by mouth every 6 (six) hours as needed for muscle spasms. 30 tablet Zera Markwardt L, PA   diclofenac (VOLTAREN) 75 MG EC tablet Take 1 tablet (75 mg total) by mouth 2 (two) times daily with a meal. 14 tablet Fabien Travelstead L, PA      PDMP not reviewed this encounter.   Maretta Bees, [place] 02/13/22 1644

## 2022-02-13 NOTE — Progress Notes (Signed)
Ref to PT

## 2022-02-13 NOTE — ED Triage Notes (Signed)
Pt c/o left shoulder pain x43month  Pt states that she had a pulled muscle and then was struck and knocked over on 12/26/21 and was here due to pain. Pt states that the pain has continued and has gotten worse over the last week.   Pt states that the pain goes from her shoulder blade to her elbow and throbs.   Pt took 600mg  of tylenol and a OTC numbing patch and pt states they are not helping.

## 2022-02-21 ENCOUNTER — Ambulatory Visit: Payer: Medicare HMO | Attending: Family Medicine | Admitting: Physical Therapy

## 2022-02-21 DIAGNOSIS — M6281 Muscle weakness (generalized): Secondary | ICD-10-CM | POA: Diagnosis not present

## 2022-02-21 DIAGNOSIS — M25512 Pain in left shoulder: Secondary | ICD-10-CM | POA: Diagnosis not present

## 2022-02-21 DIAGNOSIS — M542 Cervicalgia: Secondary | ICD-10-CM | POA: Diagnosis not present

## 2022-02-21 DIAGNOSIS — M5413 Radiculopathy, cervicothoracic region: Secondary | ICD-10-CM | POA: Diagnosis not present

## 2022-02-21 NOTE — Therapy (Signed)
OUTPATIENT PHYSICAL THERAPY NECK EVALUATION   Patient Name: Lauren Leblanc MRN: [id number] DOB:28-Mar-1951, 71 y.o., female Today's Date: 02/21/2022   PT End of Session - 02/21/22 1333     Visit Number 1    Number of Visits 13    Date for PT Re-Evaluation 04/04/22    Authorization Type [employer] 2023    Authorization Time Period VL based on medical necessity    Progress Note Due on Visit 10    PT Start Time 1340    PT Stop Time 1435    PT Time Calculation (min) 55 min    Activity Tolerance Patient tolerated treatment well    Behavior During Therapy WFL for tasks assessed/performed             Past Medical History:  Diagnosis Date   Angina at rest    Arthritis    GERD (gastroesophageal reflux disease)    Hypertension    Past Surgical History:  Procedure Laterality Date   BACK SURGERY  2014   CYST REMOVAL   BREAST BIOPSY Bilateral    neg   DILATION AND CURETTAGE OF UTERUS     TOTAL KNEE ARTHROPLASTY Left 04/13/2020   Procedure: TOTAL KNEE ARTHROPLASTY;  Surgeon: Christena Flake, MD;  Location: [facility] OR[facility];  Service: Orthopedics;  Laterality: Left;   TUBAL LIGATION     Patient Active Problem List   Diagnosis Date Noted   Statin intolerance 02/06/2022   Spondylolisthesis of lumbar region 06/18/2020   Sacroiliitis (HCC) 06/18/2020   Status post total knee replacement using cement, left 04/13/2020   Esophageal dysphagia 07/04/2019   Familial hypercholesterolemia 07/04/2019   Gastroesophageal reflux disease 07/04/2019   Obesity (BMI 30.0-34.9) 03/13/2018   Primary osteoarthritis of left knee 03/13/2018   Hyperlipidemia, mixed 04/26/2017   Chest pain 04/24/2017   Essential hypertension 01/06/2016    PCP: Duanne Limerick, MD  REFERRING PROVIDER: Duanne Limerick, MD  REFERRING DIAGNOSIS: M54.13 (ICD-10-CM) - Radiculopathy of cervicothoracic region  THERAPY DIAG: Cervicalgia  Left shoulder pain, unspecified chronicity  Muscle weakness  (generalized)  RATIONALE FOR EVALUATION AND TREATMENT: Rehabilitation  ONSET DATE: 12/26/21  FOLLOW UP APPT WITH PROVIDER: No    SUBJECTIVE:                                                                                                                                                                                         Chief Complaint: Patient is a 71 year old female with primary complaint of L-sided neck/shoulder pain.   Pertinent History Patient is a 71 year old female with primary complaint of L-sided neck/shoulder pain. Pt reports she will be 2 years  s/p TKA this December. She reports injuring her L shoulder when trying to offload LLE. 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 Pt reports pain emanating from L periscapular region and down her arm to her elbow. She reports aching, burning, stinging into her L upper limb. Pt reports taking half dose of muscle relaxer due to sensitivity to medication. Pt denies current visual changes, photosensitivity or sound sensitivity. She did have headache after her head injury, but this it not primary concern at this time. 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   Pain:  Pain Intensity: Present: 2/10, Best: 2/10 (pain mitigated with tizanidine/diclofenac), Worst: 8/10 Pain location: L upper trapezius, L periscapular region and down LUE Pain Quality: burning, stinging aching  Radiating: Yes, down L upper limb Numbness/Tingling: No Focal Weakness: No Aggravating factors: lying on L side or R side, overhead activity,  Relieving factors: pillows around head/neck, Rx medication 24-hour pain behavior: None History of prior neck injury, pain, surgery, or therapy: Yes, Hx of shoulder pain prior to farm accident - exacerbated by fall  Falls: Has patient fallen in last 6 months? Yes, Number of falls: 2 times  when working on farm  Follow-up appointment with MD: No Dominant hand: left Imaging: Yes  Head/neck CT 12/27/21 IMPRESSION: 1.  No intracranial large vessel occlusion or significant stenosis. 2.  No hemodynamically significant stenosis in the neck. 3. No evidence of dissection or aneurysm in the head or neck.    Prior level of function: Independent Occupational demands: pt is formally retired, works on farm tending to Insurance underwriter: Psychologist, forensic flags (personal history of cancer, h/o spinal tumors, history of compression fracture, chills/fever, night sweats, nausea, vomiting, unrelenting pain): Negative  Hx of precancerous skin lesions only, nausea acutely following head trauma in September   Precautions: Fall Hx with working on farm   [employer]earing Restrictions: No  Economist Lives with: lives with their spouse, daughter and her family live nearby in [place]  Lives in: House/apartment   Patient Goals: Find out cause of left upper quarter pain, getting off of medication    OBJECTIVE:   Patient Surveys  FOTO 53, predicted score of 38  Cognition Patient is oriented to person, place, and time.  Recent memory is intact.  Remote memory is intact.  Attention span and concentration are intact.  Expressive speech is intact.  Patient's fund of knowledge is within normal limits for educational level.    Gross Musculoskeletal Assessment Tremor: None Bulk: Normal Tone: Normal   Posture Forward head, mild rounded shoulders posture  AROM  Shoulder Flexion 150 bilat (pain on L upper trap), Shoulder Abduction WNL bilat (pain in L deltoid with return to neutral position)  AROM (Normal range in degrees) AROM 02/21/2022  Cervical  Flexion (50) 55*  Extension (80) 30*  Right lateral flexion (45) 45  Left lateral flexion (45) 43  Right rotation (85) 75  Left rotation (85) 57*  (* = pain; Blank rows = not tested)    MMT  MMT  (out of 5) Right 02/21/2022 Left 02/21/2022      Shoulder   Flexion 4 4  Extension    Abduction 4 4  Internal rotation    Horizontal abduction    Horizontal adduction    Lower Trapezius  Rhomboids        Elbow  Flexion 4+ 4+  Extension 5 4+  Pronation    Supination        Wrist  Flexion 5 5  Extension 5 5  Radial deviation    Ulnar deviation    (* = pain; Blank rows = not tested)  Sensation Grossly intact to light touch bilateral UE as determined by testing dermatomes C2-T2. Proprioception and hot/cold testing deferred on this date.  Reflexes R/L Elbow: 2+/2+  Brachioradialis: 2+/2+  Tricep: 1+/1+ Patellar Tendon 2+/2+ Achilles 2+/1+ Clonus Negative bilat   Palpation  Location LEFT  RIGHT           Suboccipitals    Cervical paraspinals 1 0  Upper Trapezius 2   Levator Scapulae 2   Rhomboid Major/Minor 2   Infraspinatus 2   (Blank rows = not tested) Graded on 0-4 scale (0 = no pain, 1 = pain, 2 = pain with wincing/grimacing/flinching, 3 = pain with withdrawal, 4 = unwilling to allow palpation), (Blank rows = not tested)   Repeated Movements Repeated cervical retraction in supine: no effect during, minimal symptoms after when lying in supine    SPECIAL TESTS Spurlings A (ipsilateral lateral flexion/axial compression): R: Negative L: Positive Distraction Test: Positive for modest pain relief   Cervical spine compression: Negative Hoffman Sign (cervical cord compression): R: Negative L: Negative    TODAY'S TREATMENT    Therapeutic Exercise - for HEP establishment, discussion on appropriate exercise/activity modification, PT education   Reviewed baseline home exercises and provided handout for Med[employer] program (see Access Code); tactile cueing and therapist demonstration utilized as needed for carryover of proper technique to HEP.    Patient education on current condition, anatomy involved, prognosis, plan of care. Discussion on activity  modification to prevent flare-up of condition, including postural correction and limiting dosage of overhead activity on farm.     PATIENT EDUCATION:  Education details: Plan of care, HEP Person educated: Patient Education method: Explanation Education comprehension: verbalized understanding   HOME EXERCISE PROGRAM: Access Code: [email or web address] URL: [email or web address] Date: 02/21/2022 Prepared by: Consuela Mimes  Exercises - Seated Cervical Retraction  - 5-6 x daily - 7 x weekly - 1 sets - 10 reps - sec hold - Seated Upper Trapezius Stretch  - 2 x daily - 7 x weekly - 3 sets - 30sec hold - Seated Scapular Retraction  - 2 x daily - 7 x weekly - 2 sets - 10 reps - 3sec hold   ASSESSMENT:  CLINICAL IMPRESSION: Patient is a 71 y.o. female who was seen today for physical therapy evaluation and treatment for neck and L shoulder pain. Clinical presentation is consistent with cervical spine referred pain and overlay of myofascial pain after trauma in September of 2023 Objective impairments include decreased cervical spine AROM, pain with shoulder elevation, dec shoulder flexion/ABD strength, postural changes, and sensitivity along L upper trap/rhomboid maj/posterior cuff mm. 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 Personal factors including Age, Past/current experiences, Time since onset of injury/illness/exacerbation, and 3+ comorbidities: HTN, OA, HLD)  are also affecting patient's functional outcome. Patient will benefit from skilled PT to address above impairments and improve overall function.  REHAB POTENTIAL: Good  CLINICAL DECISION MAKING: Evolving/moderate complexity  EVALUATION COMPLEXITY: Moderate   GOALS: Goals reviewed with patient? Yes  SHORT TERM GOALS: Target date: 03/14/2022  Pt will be independent with HEP to improve strength and decrease neck pain to improve  pain-free function at home and work. Baseline: 02/21/22:  Baseline HEP initiated Goal status: INITIAL   LONG TERM GOALS: Target date: 04/04/2022  Pt will increase FOTO to at least 66 to demonstrate significant improvement in function at home and work related to neck pain  Baseline: 02/21/22: 53 Goal status: INITIAL  2.  Pt will decrease worst neck pain by at least 2 points on the NPRS in order to demonstrate clinically significant reduction in neck pain. Baseline: 02/21/22: 8/10 at worst Goal status: INITIAL  3.  Pt will have full cervical spine AROM without reproduction of symptoms as needed for scanning environment and performing overhead activity as needed for working on her farm and driving to access community    Baseline: 02/21/22: Motion loss with extension and L rotation, pain with flexion/extension/L rotation.   Goal status: INITIAL  4.  Pt will perform wall alphabet with hand fixed on wall without reproduction of pain indicative of improved ability to perform repetitive forward reaching activity as needed for refurbishing antique furniture  Baseline: 02/21/22: Pain with forward shoulder flexion and repetitive L upper limb movement.    Goal status: INITIAL   PLAN: PT FREQUENCY: 2x/week  PT DURATION: 6 weeks  PLANNED INTERVENTIONS: Therapeutic exercises, Therapeutic activity, Neuromuscular re-education,  Patient/Family education, Joint mobilization, Dry Needling, Electrical stimulation, Spinal mobilization, Cryotherapy, Moist heat, Traction, and Manual therapy  PLAN FOR NEXT SESSION: F/u on response with HEP/repeated retraction, continue with traction and manual therapy. Check for accessory motion/sideglides next visit. Consider use of TDN for sensitivity along L UT and periscapular mm    Valentina Gu, PT, DPT [phone]  Eilleen Kempf 02/21/2022, 2:50 PM

## 2022-02-23 ENCOUNTER — Ambulatory Visit: Payer: Medicare HMO | Attending: Family Medicine | Admitting: Physical Therapy

## 2022-02-23 DIAGNOSIS — M6281 Muscle weakness (generalized): Secondary | ICD-10-CM | POA: Insufficient documentation

## 2022-02-23 DIAGNOSIS — M25512 Pain in left shoulder: Secondary | ICD-10-CM | POA: Diagnosis not present

## 2022-02-23 DIAGNOSIS — M542 Cervicalgia: Secondary | ICD-10-CM | POA: Insufficient documentation

## 2022-02-23 NOTE — Therapy (Signed)
OUTPATIENT PHYSICAL THERAPY TREATMENT NOTE   Patient Name: Lauren Leblanc MRN: [id number] DOB:Nov 15, 1950, 71 y.o., female Today's Date: 02/23/2022  PCP: Duanne Limerick, MD REFERRING PROVIDER: Duanne Limerick, MD  END OF SESSION:   PT End of Session - 02/26/22 0530     Visit Number 2    Number of Visits 13    Date for PT Re-Evaluation 04/04/22    Authorization Type [employer] 2023    Authorization Time Period VL based on medical necessity    Progress Note Due on Visit 10    PT Start Time 1435    PT Stop Time 1516    PT Time Calculation (min) 41 min    Activity Tolerance Patient tolerated treatment well    Behavior During Therapy WFL for tasks assessed/performed             Past Medical History:  Diagnosis Date   Angina at rest    Arthritis    GERD (gastroesophageal reflux disease)    Hypertension    Past Surgical History:  Procedure Laterality Date   BACK SURGERY  2014   CYST REMOVAL   BREAST BIOPSY Bilateral    neg   DILATION AND CURETTAGE OF UTERUS     TOTAL KNEE ARTHROPLASTY Left 04/13/2020   Procedure: TOTAL KNEE ARTHROPLASTY;  Surgeon: Christena Flake, MD;  Location: [facility] ORS;  Service: Orthopedics;  Laterality: Left;   TUBAL LIGATION     Patient Active Problem List   Diagnosis Date Noted   Statin intolerance 02/06/2022   Spondylolisthesis of lumbar region 06/18/2020   Sacroiliitis (HCC) 06/18/2020   Status post total knee replacement using cement, left 04/13/2020   Esophageal dysphagia 07/04/2019   Familial hypercholesterolemia 07/04/2019   Gastroesophageal reflux disease 07/04/2019   Obesity (BMI 30.0-34.9) 03/13/2018   Primary osteoarthritis of left knee 03/13/2018   Hyperlipidemia, mixed 04/26/2017   Chest pain 04/24/2017   Essential hypertension 01/06/2016    REFERRING DIAG: M54.13 (ICD-10-CM) - Radiculopathy of cervicothoracic region   THERAPY DIAG:  Cervicalgia  Left shoulder pain, unspecified chronicity  Muscle weakness  (generalized)  Rationale for Evaluation and Treatment Rehabilitation  PERTINENT HISTORY: Patient is a 71 year old female with primary complaint of L-sided neck/shoulder pain. Pt reports she will be 2 years s/p TKA this December. She reports injuring her L shoulder when trying to offload LLE. 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 Pt reports pain emanating from L periscapular region and down her arm to her elbow. She reports aching, burning, stinging into her L upper limb. Pt reports taking half dose of muscle relaxer due to sensitivity to medication. Pt denies current visual changes, photosensitivity or sound sensitivity. She did have headache after her head injury, but this it not primary concern at this time. 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    Pain:  Pain Intensity: Present: 2/10, Best: 2/10 (pain mitigated with tizanidine/diclofenac), Worst: 8/10 Pain location: L upper trapezius, L periscapular region and down LUE Pain Quality: burning, stinging aching  Radiating: Yes, down L upper limb Numbness/Tingling: No Focal Weakness: No Aggravating factors: lying on L side or R side, overhead activity,  Relieving factors: pillows around head/neck, Rx medication 24-hour pain behavior: None History of prior neck injury, pain, surgery, or therapy: Yes, Hx of shoulder pain prior to farm accident -  exacerbated by fall  Falls: Has patient fallen in last 6 months? Yes, Number of falls: 2 times when working on farm  Follow-up appointment with MD: No Dominant hand: left Imaging: Yes   Head/neck CT 12/27/21 IMPRESSION: 1.  No intracranial large vessel occlusion or significant stenosis. 2.  No hemodynamically significant stenosis in the neck. 3. No evidence of dissection or aneurysm in the head or neck.     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  Hobbies: Psychologist, forensicrefinishing furniture  Red flags (personal history of cancer, h/o spinal tumors, history of compression fracture, chills/fever, night sweats, nausea, vomiting, unrelenting pain): Negative             Hx of precancerous skin lesions only, nausea acutely following head trauma in September    Precautions: Fall Hx with working on farm    [employer] Restrictions: No   EconomistLiving Environment Lives with: lives with their spouse, daughter and her family [place]  Lives in: House/apartment     Patient Goals: Find out cause of left upper quarter pain, getting off of medication     PRECAUTIONS: Fall Hx 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 She is continuing with medication management as prescribed by MD. Pt reports low level pain with her current medication. Pain along R middle deltoid.    PAIN:  Are you having pain? Yes: NPRS scale: 2-3/10 Pain location: R middle deltoid    OBJECTIVE: (objective measures completed at initial evaluation unless otherwise dated)    Patient Surveys  FOTO 53, predicted score of 2666   Cognition Patient is oriented to person, place, and time.  Recent memory is intact.  Remote memory is intact.  Attention span and concentration are intact.  Expressive speech is intact.  Patient's fund of knowledge is within normal limits for educational level.                          Gross Musculoskeletal Assessment Tremor: None Bulk: Normal Tone: Normal     Posture Forward head, mild rounded shoulders posture   AROM   Shoulder Flexion 150 bilat (pain on L upper trap), Shoulder Abduction WNL  bilat (pain in L deltoid with return to neutral position)   AROM (Normal range in degrees)  AROM 02/21/2022  Cervical  Flexion (50) 55*  Extension (80) 30*  Right lateral flexion (45) 45  Left lateral flexion (45) 43  Right rotation (85) 75  Left rotation (85) 57*  (* = pain; Blank rows = not tested)       MMT   MMT (out of 5) Right 02/21/2022 Left 02/21/2022         Shoulder   Flexion 4 4  Extension      Abduction 4 4  Internal rotation      Horizontal abduction      Horizontal adduction      Lower Trapezius      Rhomboids             Elbow  Flexion 4+ 4+  Extension 5 4+  Pronation      Supination             Wrist  Flexion 5 5  Extension 5 5  Radial deviation      Ulnar deviation      (* = pain; Blank rows = not tested)   Sensation Grossly intact to light touch bilateral UE as determined by testing dermatomes C2-T2. Proprioception and hot/cold testing deferred on this date.   Reflexes R/L Elbow: 2+/2+  Brachioradialis: 2+/2+  Tricep: 1+/1+ Patellar Tendon 2+/2+ Achilles 2+/1+ Clonus Negative bilat    Palpation   Location LEFT  RIGHT           Suboccipitals      Cervical paraspinals 1 0  Upper Trapezius 2    Levator Scapulae 2    Rhomboid Major/Minor 2    Infraspinatus 2    (Blank rows = not tested) Graded on 0-4 scale (0 = no pain, 1 = pain, 2 = pain with wincing/grimacing/flinching, 3 = pain with withdrawal, 4 = unwilling to allow palpation), (Blank rows = not tested)     Repeated Movements Repeated cervical retraction in supine: no effect during, minimal symptoms after when lying in supine     SPECIAL TESTS Spurlings A (ipsilateral lateral flexion/axial compression): R: Negative L: Positive Distraction Test: Positive for modest pain relief   Cervical spine compression: Negative Hoffman Sign (cervical cord compression): R: Negative L: Negative    PASSIVE ACCESSORY JOINT MOBILITY Hypomobile CPA C5-7 with moderate pain at restriction.  Decreased L to R sideglide C4-7 with mild pain at restriction.       TODAY'S TREATMENT    OBJECTIVE FINDINGS  AROM Cervical flexion:  WNL Cervical extension: significant motion loss* Lateral flexion: Right moderate motion loss , Left moderate motion loss* Cervical rotation: Right WNL, Left mild motion loss* *Indicates pain   Manual Therapy - for symptom modulation, soft tissue sensitivity and mobility, joint mobility, nerve root decompression for pain reduction  PASSIVE ACCESSORY JOINT MOBILITY   Hypomobile CPA C5-7 with moderate pain at restriction. Decreased L to R sideglide C4-7 with mild pain at restriction.    Manual cervical traction; 10 sec on, 10 sec off; with patient in supine; x 10 minutes STM/DTM left UT, levator scapulae, cervical paraspinals  Cervical sideglides; emphasis on L to R; gr II for pain control x 30 sec, gr III for joint mobility x 30 sec at each level along C4-6       Trigger Point Dry Needling (TDN), unbilled Education performed with patient regarding potential benefit of TDN. Reviewed precautions and risks with patient. Reviewed special precautions/risks over lung fields which include pneumothorax. Reviewed signs and symptoms of pneumothorax and advised pt  to go to ER immediately if these symptoms develop advise them of dry needling treatment. Extensive time spent with pt to ensure full understanding of TDN risks. Pt provided verbal consent to treatment. TDN performed to left upper trapezius x 2, left levator scapulae, left middle trapezius  with 0.25 x 40 single needle placements with local twitch response (LTR). Pistoning technique utilized. Minimal post-DN soreness noted today.      Therapeutic Exercise - for improved soft tissue flexibility and extensibility as needed for ROM, improved strength as needed to improve performance of CKC activities/functional movements  Cervical retraction; 1x10 Cervical sidebend stretch; reviewed  PATIENT EDUCATION:  Discussed pertinent activity modification strategies and reviewed existing HEP. Discussed importance of consistent high frequency with repeated movement program.        PATIENT EDUCATION:  Education details: Plan of care, HEP Person educated: Patient Education method: Explanation Education comprehension: verbalized understanding     HOME EXERCISE PROGRAM: Access Code: [email or web address] URL: [email or web address] Date: 02/21/2022 Prepared by: Valentina Gu   Exercises - Seated Cervical Retraction  - 5-6 x daily - 7 x weekly - 1 sets - 10 reps - sec hold - Seated Upper Trapezius Stretch  - 2 x daily - 7 x weekly - 3 sets - 30sec hold - Seated Scapular Retraction  - 2 x daily - 7 x weekly - 2 sets - 10 reps - 3sec hold     ASSESSMENT:   CLINICAL IMPRESSION:  Patient is currently managing symptoms well with muscle relaxer and anti-inflammatory medication, but pt does hope to wean down from these medications without prolonged reliance on Rx meds. Patient has remaining deficits in decreased cervical spine AROM, pain with shoulder elevation, dec shoulder flexion/ABD strength, postural changes, and sensitivity along L upper trap/rhomboid maj/posterior cuff mm. 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 Patient will benefit from skilled continued PT to address above impairments and improve overall function.   REHAB POTENTIAL: Good   CLINICAL DECISION MAKING: Evolving/moderate complexity   EVALUATION COMPLEXITY: Moderate     GOALS: Goals reviewed with patient? Yes   SHORT TERM GOALS: Target date: 03/14/2022   Pt will be independent with HEP to improve strength and decrease neck pain to improve pain-free function at home and work. Baseline: 02/21/22: Baseline HEP initiated Goal status: INITIAL     LONG TERM GOALS: Target date: 04/04/2022   Pt will increase FOTO to at least 66 to demonstrate significant improvement in function at home and work  related to neck pain  Baseline: 02/21/22: 53 Goal status: INITIAL   2.  Pt will decrease worst neck pain by at least 2 points on the NPRS in order to demonstrate clinically significant reduction in neck pain. Baseline: 02/21/22: 8/10 at worst Goal status: INITIAL   3.  Pt will have full cervical spine AROM without reproduction of symptoms as needed for scanning environment and performing overhead activity as needed for working on her farm and driving to access community    Baseline: 02/21/22: Motion loss with extension and L rotation, pain with flexion/extension/L rotation.   Goal status: INITIAL   4.  Pt will perform wall alphabet with hand fixed on wall without reproduction of pain indicative of improved ability to perform repetitive forward reaching activity as needed for refurbishing antique furniture  Baseline: 02/21/22: Pain with forward shoulder flexion and repetitive L upper limb movement.    Goal status: INITIAL     PLAN: PT FREQUENCY: 2x/week   PT DURATION: 6  weeks   PLANNED INTERVENTIONS: Therapeutic exercises, Therapeutic activity, Neuromuscular re-education,  Patient/Family education, Joint mobilization, Dry Needling, Electrical stimulation, Spinal mobilization, Cryotherapy, Moist heat, Traction, and Manual therapy   PLAN FOR NEXT SESSION: F/u on response with HEP/repeated retraction, continue with traction and manual therapy. Check for accessory motion/sideglides next visit. Consider use of TDN for sensitivity along L UT and periscapular mm     THIS NOTE IS INCOMPLETE, PLEASE DO NOT REFERENCE FOR INFORMATION  Consuela Mimes, PT, DPT #[id number]  Gertie Exon, PT 02/26/2022, 5:31 AM

## 2022-02-26 ENCOUNTER — Encounter: Payer: Self-pay | Admitting: Physical Therapy

## 2022-02-28 ENCOUNTER — Encounter: Payer: Self-pay | Admitting: Physical Therapy

## 2022-02-28 ENCOUNTER — Ambulatory Visit: Payer: Medicare HMO | Admitting: Physical Therapy

## 2022-02-28 DIAGNOSIS — M542 Cervicalgia: Secondary | ICD-10-CM

## 2022-02-28 DIAGNOSIS — M6281 Muscle weakness (generalized): Secondary | ICD-10-CM

## 2022-02-28 DIAGNOSIS — M25512 Pain in left shoulder: Secondary | ICD-10-CM

## 2022-02-28 NOTE — Therapy (Signed)
OUTPATIENT PHYSICAL THERAPY TREATMENT NOTE   Patient Name: Lauren Leblanc MRN: [id number] DOB:[birth year], 71 y.o., female Today's Date: 02/23/2022  PCP: Duanne Limerick, MD REFERRING PROVIDER: Duanne Limerick, MD  END OF SESSION:   PT End of Session - 02/28/22 1348     Visit Number 3    Number of Visits 13    Date for PT Re-Evaluation 04/04/22    Authorization Type [employer] 2023    Authorization Time Period VL based on medical necessity    Progress Note Due on Visit 10    PT Start Time 1346    PT Stop Time 1445    PT Time Calculation (min) 59 min    Activity Tolerance Patient tolerated treatment well    Behavior During Therapy WFL for tasks assessed/performed              Past Medical History:  Diagnosis Date   Angina at rest    Arthritis    GERD (gastroesophageal reflux disease)    Hypertension    Past Surgical History:  Procedure Laterality Date   BACK SURGERY  2014   CYST REMOVAL   BREAST BIOPSY Bilateral    neg   DILATION AND CURETTAGE OF UTERUS     TOTAL KNEE ARTHROPLASTY Left 04/13/2020   Procedure: TOTAL KNEE ARTHROPLASTY;  Surgeon: Christena Flake, MD;  Location: [facility] ORS;  Service: Orthopedics;  Laterality: Left;   TUBAL LIGATION     Patient Active Problem List   Diagnosis Date Noted   Statin intolerance 02/06/2022   Spondylolisthesis of lumbar region 06/18/2020   Sacroiliitis (HCC) 06/18/2020   Status post total knee replacement using cement, left 04/13/2020   Esophageal dysphagia 07/04/2019   Familial hypercholesterolemia 07/04/2019   Gastroesophageal reflux disease 07/04/2019   Obesity (BMI 30.0-34.9) 03/13/2018   Primary osteoarthritis of left knee 03/13/2018   Hyperlipidemia, mixed 04/26/2017   Chest pain 04/24/2017   Essential hypertension 01/06/2016    REFERRING DIAG: M54.13 (ICD-10-CM) - Radiculopathy of cervicothoracic region   THERAPY DIAG:  Cervicalgia  Left shoulder pain, unspecified chronicity  Muscle weakness  (generalized)  Rationale for Evaluation and Treatment Rehabilitation  PERTINENT HISTORY: Patient is a 71 year old female with primary complaint of L-sided neck/shoulder pain. Pt reports she will be 2 years s/p TKA this December. She reports injuring her L shoulder when trying to offload LLE. 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 Pt reports pain emanating from L periscapular region and down her arm to her elbow. She reports aching, burning, stinging into her L upper limb. Pt reports taking half dose of muscle relaxer due to sensitivity to medication. Pt denies current visual changes, photosensitivity or sound sensitivity. She did have headache after her head injury, but this it not primary concern at this time. 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    Pain:  Pain Intensity: Present: 2/10, Best: 2/10 (pain mitigated with tizanidine/diclofenac), Worst: 8/10 Pain location: L upper trapezius, L periscapular region and down LUE Pain Quality: burning, stinging aching  Radiating: Yes, down L upper limb Numbness/Tingling: No Focal Weakness: No Aggravating factors: lying on L side or R side, overhead activity,  Relieving factors: pillows around head/neck, Rx medication 24-hour pain behavior: None History of prior neck injury, pain, surgery, or therapy: Yes, Hx of shoulder pain prior to farm  accident - exacerbated by fall  Falls: Has patient fallen in last 6 months? Yes, Number of falls: 2 times when working on farm  Follow-up appointment with MD: No Dominant hand: left Imaging: Yes   Head/neck CT 12/27/21 IMPRESSION: 1.  No intracranial large vessel occlusion or significant stenosis. 2.  No hemodynamically significant stenosis in the neck. 3. No evidence of dissection or aneurysm in the head or neck.     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, music flags (personal history of cancer, h/o spinal tumors, history of compression fracture, chills/fever, night sweats, nausea, vomiting, unrelenting pain): Negative             Hx of precancerous skin lesions only, nausea acutely following head trauma in September    Precautions: Fall Hx with working on farm    [employer] Restrictions: No   Financial controller Lives with: lives with their spouse, daughter and her family live nearby in [place]  Lives in: House/apartment     Patient Goals: Find out cause of left upper quarter pain, getting off of medication     PRECAUTIONS: Fall Hx 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 Patient reports notable soreness after her last visit. Heat along the affected region and a hot shower helps. Patient reports persisting pain along periscapular region and L upper trapezius.    PAIN:  Are you having pain? Yes: NPRS scale: 4-5/10 Pain location: R paracervical region and along R periscapular region     OBJECTIVE: (objective measures completed at initial evaluation unless otherwise dated)    Patient Surveys  FOTO 53, predicted score of 73   Cognition Patient is oriented to person, place, and time.  Recent memory is intact.  Remote memory is intact.  Attention span and concentration are intact.  Expressive speech is intact.  Patient's fund of knowledge is within normal limits for educational level.                           Gross Musculoskeletal Assessment Tremor: None Bulk: Normal Tone: Normal     Posture Forward head, mild rounded  shoulders posture   AROM   Shoulder Flexion 150 bilat (pain on L upper trap), Shoulder Abduction WNL bilat (pain in L deltoid with return to neutral position)   AROM (Normal range in degrees) AROM 02/21/2022  Cervical  Flexion (50) 55*  Extension (80) 30*  Right lateral flexion (45) 45  Left lateral flexion (45) 43  Right rotation (85) 75  Left rotation (85) 57*  (* = pain; Blank rows = not tested)       MMT   MMT (out of 5) Right 02/21/2022 Left 02/21/2022         Shoulder   Flexion 4 4  Extension      Abduction 4 4  Internal rotation      Horizontal abduction      Horizontal adduction      Lower Trapezius      Rhomboids             Elbow  Flexion 4+ 4+  Extension 5 4+  Pronation      Supination             Wrist  Flexion 5 5  Extension 5 5  Radial deviation      Ulnar deviation      (* = pain; Blank rows = not tested)   Sensation Grossly intact to light touch bilateral UE as determined by testing dermatomes C2-T2. Proprioception and hot/cold testing deferred on this date.   Reflexes R/L Elbow: 2+/2+  Brachioradialis: 2+/2+  Tricep: 1+/1+ Patellar Tendon 2+/2+ Achilles 2+/1+ Clonus Negative bilat    Palpation   Location LEFT  RIGHT           Suboccipitals      Cervical paraspinals 1 0  Upper Trapezius 2    Levator Scapulae 2    Rhomboid Major/Minor 2    Infraspinatus 2    (Blank rows = not tested) Graded on 0-4 scale (0 = no pain, 1 = pain, 2 = pain with wincing/grimacing/flinching, 3 = pain with withdrawal, 4 = unwilling to allow palpation), (Blank rows = not tested)     Repeated Movements Repeated cervical retraction in supine: no effect during, minimal symptoms after when lying in supine     SPECIAL TESTS Spurlings A (ipsilateral lateral flexion/axial compression): R: Negative L: Positive Distraction Test:  Positive for modest pain relief   Cervical spine compression: Negative Hoffman Sign (cervical cord compression): R: Negative L: Negative    PASSIVE ACCESSORY JOINT MOBILITY Hypomobile CPA C5-7 with moderate pain at restriction. Decreased L to R sideglide C4-7 with mild pain at restriction.        TODAY'S TREATMENT    OBJECTIVE FINDINGS  AROM Cervical flexion:  WNL Cervical extension: significant motion loss* Lateral flexion: Right moderate motion loss , Left moderate motion loss* Cervical rotation: Right WNL, Left mild motion loss* *Indicates pain   Therapeutic Exercise - for improved soft tissue flexibility and extensibility as needed for ROM, improved strength as needed to improve performance of CKC activities/functional movements  Cervical retraction; 2x10; increase in pain along L arm during, no worse after  Cervical retraction, with patient overpressure; 1x10; increase in pain along posterior C-spine during, no worse after   PATIENT EDUCATION: Discussed ongoing use of medication as directed by physician for symptom management and use of heat at home prn. Discussed progression of cervical retraction to include patient overpressure in gravity-eliminated position/supine.    Manual Therapy - for symptom modulation, soft tissue sensitivity and mobility, joint mobility, nerve  root decompression for pain reduction    Manual cervical traction; 10 sec on, 10 sec off; with patient in supine; x 12 minutes   Manual cervical traction + retraction; x 10 repetitions, 1 sec hold at end-range (no overpressure)  STM/DTM left UT, levator scapulae, cervical paraspinals x 8 minutes   *hold today* Cervical sideglides; emphasis on L to R; gr II for pain control x 30 sec, gr III for joint mobility x 30 sec at each level along C4-6     Trigger Point Dry Needling (TDN), unbilled Education performed with patient regarding potential benefit of TDN as well as pertinent precautions and risks  with patient at previous session. Pt provided verbal consent to treatment. TDN performed to left splenius cervicis at C5-6, left upper trapezius, left rhomboid major with 0.25 x 40 single needle placements with local twitch response (LTR). Pistoning technique utilized. Notable post-DN soreness noted today.    *not today* Cervical sidebend stretch; reviewed    Following treatment in prone lying, patient reports significant pain affecting L shoulder and proximal arm. Pt requires ModA to transfer from prone lying to sitting edge of table. Utilized MHP along affected region and pt's condition was monitored post-treatment to ensure gradual decrease in symptoms following notable pain and distress reported following transfer from treatment table at end of session.    MHP (unbilled) utilized post-treatment for analgesic effect and improved soft tissue extensibility, along R upper trapezius and R proximal arm in sitting; x 10 minutes.    PATIENT EDUCATION:  Education details: Plan of care, HEP Person educated: Patient Education method: Explanation Education comprehension: verbalized understanding     HOME EXERCISE PROGRAM: Access Code: [email or web address] URL: [email or web address] Date: 02/21/2022 Prepared by: Consuela Mimes   Exercises - Seated Cervical Retraction  - 5-6 x daily - 7 x weekly - 1 sets - 10 reps - sec hold - Seated Upper Trapezius Stretch  - 2 x daily - 7 x weekly - 3 sets - 30sec hold - Seated Scapular Retraction  - 2 x daily - 7 x weekly - 2 sets - 10 reps - 3sec hold     ASSESSMENT:   CLINICAL IMPRESSION:  Patient had notable soreness after DN last visit, which she felt gradually diminished by the following day. She reports increasing pain over the last few days with pain affecting L cervical paraspinal region, L UT, L periscapular region, and L upper arm down to level of L elbow. Symptoms are abated with use of manual traction. Symptoms are monitored today during  repeated movement trials with modest progression of force with repeated movement; pt reports feeling increase symptoms during repeated movement trials with no notable change in symptoms afterward. Utilized traction with cervical retraction with ongoing symptom reproduction during trials without significant change afterward. Updated repeated movement program to include patient overpressure today and will f/u on symptomatic response and change in movement baselines with subsequent visits. Pt unfortunately has significant heightened pain at end of session today following prone lying and transferring into supine followed by sitting. Utilized moist heat and allowed period of rest with monitoring of pt symptoms after session with symptoms slowly easing. Pt unfortunately has struggled with pain management and had difficulty with positioning for treatment today. Pt may need use of other modalities for pain with irritability of current left upper quarter condition. Patient has remaining deficits in decreased cervical spine AROM, pain with shoulder elevation, dec shoulder flexion/ABD strength, postural changes, and sensitivity along L upper trap/rhomboid maj/posterior cuff mm.  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 Patient will benefit from skilled continued PT to address above impairments and improve overall function.   REHAB POTENTIAL: Good   CLINICAL DECISION MAKING: Evolving/moderate complexity   EVALUATION COMPLEXITY: Moderate     GOALS: Goals reviewed with patient? Yes   SHORT TERM GOALS: Target date: 03/14/2022   Pt will be independent with HEP to improve strength and decrease neck pain to improve pain-free function at home and work. Baseline: 02/21/22: Baseline HEP initiated Goal status: INITIAL     LONG TERM GOALS: Target date: 04/04/2022   Pt will increase FOTO to at least 66 to demonstrate significant improvement in function at home and work related to neck  pain  Baseline: 02/21/22: 53 Goal status: INITIAL   2.  Pt will decrease worst neck pain by at least 2 points on the NPRS in order to demonstrate clinically significant reduction in neck pain. Baseline: 02/21/22: 8/10 at worst Goal status: INITIAL   3.  Pt will have full cervical spine AROM without reproduction of symptoms as needed for scanning environment and performing overhead activity as needed for working on her farm and driving to access community    Baseline: 02/21/22: Motion loss with extension and L rotation, pain with flexion/extension/L rotation.   Goal status: INITIAL   4.  Pt will perform wall alphabet with hand fixed on wall without reproduction of pain indicative of improved ability to perform repetitive forward reaching activity as needed for refurbishing antique furniture  Baseline: 02/21/22: Pain with forward shoulder flexion and repetitive L upper limb movement.    Goal status: INITIAL     PLAN: PT FREQUENCY: 2x/week   PT DURATION: 6 weeks   PLANNED INTERVENTIONS: Therapeutic exercises, Therapeutic activity, Neuromuscular re-education,  Patient/Family education, Joint mobilization, Dry Needling, Electrical stimulation, Spinal mobilization, Cryotherapy, Moist heat, Traction, and Manual therapy   PLAN FOR NEXT SESSION: F/u on response with HEP/repeated retraction, continue with traction and manual therapy for symptom modulation. Exercise and manual techniques for cervical spine mobility and postural re-edu. Follow-up on course of pain status following incident at end of session today. Will reach out to patient between visits as able.      Consuela Mimes, PT, DPT #[id number]  Gertie Exon, PT 02/28/2022, 8:37 PM

## 2022-03-02 ENCOUNTER — Ambulatory Visit: Payer: Medicare HMO | Admitting: Physical Therapy

## 2022-03-02 DIAGNOSIS — M542 Cervicalgia: Secondary | ICD-10-CM | POA: Diagnosis not present

## 2022-03-02 DIAGNOSIS — M25512 Pain in left shoulder: Secondary | ICD-10-CM | POA: Diagnosis not present

## 2022-03-02 DIAGNOSIS — M6281 Muscle weakness (generalized): Secondary | ICD-10-CM | POA: Diagnosis not present

## 2022-03-02 NOTE — Therapy (Signed)
OUTPATIENT PHYSICAL THERAPY TREATMENT NOTE   Patient Name: Lauren Leblanc MRN: [id number] DOB:02-May-1950, 71 y.o., female Today's Date: 02/23/2022  PCP: Duanne Limerick, MD REFERRING PROVIDER: Duanne Limerick, MD  END OF SESSION:   PT End of Session - 03/06/22 0949     Visit Number 4    Number of Visits 13    Date for PT Re-Evaluation 04/04/22    Authorization Type [employer] 2023    Authorization Time Period VL based on medical necessity    Progress Note Due on Visit 10    PT Start Time 1346    PT Stop Time 1437    PT Time Calculation (min) 51 min    Activity Tolerance Patient tolerated treatment well    Behavior During Therapy WFL for tasks assessed/performed               Past Medical History:  Diagnosis Date   Angina at rest    Arthritis    GERD (gastroesophageal reflux disease)    Hypertension    Past Surgical History:  Procedure Laterality Date   BACK SURGERY  2014   CYST REMOVAL   BREAST BIOPSY Bilateral    neg   DILATION AND CURETTAGE OF UTERUS     TOTAL KNEE ARTHROPLASTY Left 04/13/2020   Procedure: TOTAL KNEE ARTHROPLASTY;  Surgeon: Christena Flake, MD;  Location: [facility] ORS;  Service: Orthopedics;  Laterality: Left;   TUBAL LIGATION     Patient Active Problem List   Diagnosis Date Noted   Statin intolerance 02/06/2022   Spondylolisthesis of lumbar region 06/18/2020   Sacroiliitis (HCC) 06/18/2020   Status post total knee replacement using cement, left 04/13/2020   Esophageal dysphagia 07/04/2019   Familial hypercholesterolemia 07/04/2019   Gastroesophageal reflux disease 07/04/2019   Obesity (BMI 30.0-34.9) 03/13/2018   Primary osteoarthritis of left knee 03/13/2018   Hyperlipidemia, mixed 04/26/2017   Chest pain 04/24/2017   Essential hypertension 01/06/2016    REFERRING DIAG: M54.13 (ICD-10-CM) - Radiculopathy of cervicothoracic region   THERAPY DIAG:  Cervicalgia  Left shoulder pain, unspecified chronicity  Muscle weakness  (generalized)  Rationale for Evaluation and Treatment Rehabilitation  PERTINENT HISTORY: Patient is a 71 year old female with primary complaint of L-sided neck/shoulder pain. Pt reports she will be 2 years s/p TKA this December. She reports injuring her L shoulder when trying to offload LLE. 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 Pt reports pain emanating from L periscapular region and down her arm to her elbow. She reports aching, burning, stinging into her L upper limb. Pt reports taking half dose of muscle relaxer due to sensitivity to medication. Pt denies current visual changes, photosensitivity or sound sensitivity. She did have headache after her head injury, but this it not primary concern at this time. 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    Pain:  Pain Intensity: Present: 2/10, Best: 2/10 (pain mitigated with tizanidine/diclofenac), Worst: 8/10 Pain location: L upper trapezius, L periscapular region and down LUE Pain Quality: burning, stinging aching  Radiating: Yes, down L upper limb Numbness/Tingling: No Focal Weakness: No Aggravating factors: lying on L side or R side, overhead activity,  Relieving factors: pillows around head/neck, Rx medication 24-hour pain behavior: None History of prior neck injury, pain, surgery, or therapy: Yes, Hx of shoulder pain prior to  farm accident - exacerbated by fall  Falls: Has patient fallen in last 6 months? Yes, Number of falls: 2 times when working on farm  Follow-up appointment with MD: No Dominant hand: left Imaging: Yes   Head/neck CT 12/27/21 IMPRESSION: 1.  No intracranial large vessel occlusion or significant stenosis. 2.  No hemodynamically significant stenosis in the neck. 3. No evidence of dissection or aneurysm in the head or neck.     Prior level of function:  Independent Occupational demands: pt is formally retired, works on farm tending to Insurance underwriter: Psychologist, forensic flags (personal history of cancer, h/o spinal tumors, history of compression fracture, chills/fever, night sweats, nausea, vomiting, unrelenting pain): Negative             Hx of precancerous skin lesions only, nausea acutely following head trauma in September    Precautions: Fall Hx with working on farm    [employer]earing Restrictions: No   Economist Lives with: lives with their spouse, daughter and her family live nearby in [place]  Lives in: House/apartment     Patient Goals: Find out cause of left upper quarter pain, getting off of medication     PRECAUTIONS: Fall Hx 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 Patient reports 5-6/10 pain presently affecting middle deltoid region when turning her head and along infraspinatus fossa region. She reports feeling better versus heightened pain experienced earlier this week.    PAIN:  Are you having pain? Yes: NPRS scale: 4-5/10 Pain location: R paracervical region and along R periscapular region     OBJECTIVE: (objective measures completed at initial evaluation unless otherwise dated)    Patient Surveys  FOTO 53, predicted score of 45   Cognition Patient is oriented to person, place, and time.  Recent memory is intact.  Remote memory is intact.  Attention span and concentration are intact.  Expressive speech is intact.  Patient's fund of knowledge is within normal limits for educational level.                          Gross Musculoskeletal Assessment Tremor: None Bulk: Normal Tone: Normal     Posture Forward  head, mild rounded shoulders posture   AROM   Shoulder Flexion 150 bilat (pain on L upper trap), Shoulder Abduction WNL  bilat (pain in L deltoid with return to neutral position)   AROM (Normal range in degrees) AROM 02/21/2022  Cervical  Flexion (50) 55*  Extension (80) 30*  Right lateral flexion (45) 45  Left lateral flexion (45) 43  Right rotation (85) 75  Left rotation (85) 57*  (* = pain; Blank rows = not tested)       MMT   MMT (out of 5) Right 02/21/2022 Left 02/21/2022         Shoulder   Flexion 4 4  Extension      Abduction 4 4  Internal rotation      Horizontal abduction      Horizontal adduction      Lower Trapezius      Rhomboids             Elbow  Flexion 4+ 4+  Extension 5 4+  Pronation      Supination             Wrist  Flexion 5 5  Extension 5 5  Radial deviation      Ulnar deviation      (* = pain; Blank rows = not tested)   Sensation Grossly intact to light touch bilateral UE as determined by testing dermatomes C2-T2. Proprioception and hot/cold testing deferred on this date.   Reflexes R/L Elbow: 2+/2+  Brachioradialis: 2+/2+  Tricep: 1+/1+ Patellar Tendon 2+/2+ Achilles 2+/1+ Clonus Negative bilat    Palpation   Location LEFT  RIGHT           Suboccipitals      Cervical paraspinals 1 0  Upper Trapezius 2    Levator Scapulae 2    Rhomboid Major/Minor 2    Infraspinatus 2    (Blank rows = not tested) Graded on 0-4 scale (0 = no pain, 1 = pain, 2 = pain with wincing/grimacing/flinching, 3 = pain with withdrawal, 4 = unwilling to allow palpation), (Blank rows = not tested)     Repeated Movements Repeated cervical retraction in supine: no effect during, minimal symptoms after when lying in supine     SPECIAL TESTS Spurlings A (ipsilateral lateral flexion/axial compression): R: Negative L: Positive Distraction Test: Positive for modest pain relief   Cervical spine compression: Negative Hoffman Sign (cervical cord  compression): R: Negative L: Negative    PASSIVE ACCESSORY JOINT MOBILITY Hypomobile CPA C5-7 with moderate pain at restriction. Decreased L to R sideglide C4-7 with mild pain at restriction.        TODAY'S TREATMENT    OBJECTIVE FINDINGS  AROM Cervical flexion:  WNL Cervical extension: significant motion loss* Lateral flexion: Right moderate motion loss , Left moderate motion loss* Cervical rotation: Right WNL, Left mild motion loss* *Indicates pain   Therapeutic Exercise - for improved soft tissue flexibility and extensibility as needed for ROM, improved strength as needed to improve performance of CKC activities/functional movements   Cervical retraction, with patient overpressure; 1x10; increase in pain along posterior C-spine during, no worse after   PATIENT EDUCATION: Discussed ongoing use of medication as directed by physician for symptom management and use of heat at home prn. Discussed centralization phenomenon and possible abatement of peripheral symptoms prior to axial and paracervical symptoms   *not today* Cervical retraction; 2x10; increase in pain along L arm during, no worse after     Manual Therapy - for symptom modulation, soft tissue sensitivity and mobility, joint mobility, nerve root decompression for pain reduction    Manual cervical traction;  10 sec on, 10 sec off; with patient in supine; x 10 minutes   Cervical sideglides; emphasis on L to R; gr II for pain control x 30 sec, gr III for joint mobility x 30 sec at each level along C4-6  STM/DTM left UT, levator scapulae, cervical paraspinals x 10 minutes   *hold today* Manual cervical traction + retraction; x 10 repetitions, 1 sec hold at end-range (no overpressure)     Trigger Point Dry Needling (TDN), unbilled Education performed with patient regarding potential benefit of TDN as well as pertinent precautions and risks with patient at previous session. Pt provided verbal consent to treatment.  TDN performed to left splenius cervicis at C5-6, left C5 multifidus, left upper trapezius, left rhomboid major, and left infraspinatus with 0.25 x 40 single needle placements with local twitch response (LTR). Pistoning technique utilized. Notable post-DN soreness noted today.    *not today* Cervical sidebend stretch; reviewed     MHP (unbilled) utilized post-treatment for analgesic effect and improved soft tissue extensibility, along R upper trapezius and R proximal arm in sitting; x 8 minutes.    PATIENT EDUCATION:  Education details: Plan of care, HEP Person educated: Patient Education method: Explanation Education comprehension: verbalized understanding     HOME EXERCISE PROGRAM: Access Code: [email or web address] URL: [email or web address] Date: 02/21/2022 Prepared by: Consuela Mimes   Exercises - Seated Cervical Retraction  - 5-6 x daily - 7 x weekly - 1 sets - 10 reps - sec hold - Seated Upper Trapezius Stretch  - 2 x daily - 7 x weekly - 3 sets - 30sec hold - Seated Scapular Retraction  - 2 x daily - 7 x weekly - 2 sets - 10 reps - 3sec hold     ASSESSMENT:   CLINICAL IMPRESSION:  Patient fortunately has improved positional tolerance for prone lying today on treatment table in private treatment room. She has ongoing pain that is of lesser intensity than earlier this week. Pt has ongoing significant motion deficits, but L cervical spine rotation is improved today. Utilized TDN for symptom modulation and to allow for increased cervical spine mobility. Pt tolerates session well with sound twitch responses obtained along L splenius cervicis, L upper trapezius, L rhomboid major, and L infraspinatus. Treated infraspinatus for potential pain modulation via TrP model given pseudoradicular pain that can eminate from infraspinatus. Patient has remaining deficits in decreased cervical spine AROM, pain with shoulder elevation, dec shoulder flexion/ABD strength, postural changes, and  sensitivity along L upper trap/rhomboid maj/posterior cuff mm. 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 Patient will benefit from skilled continued PT to address above impairments and improve overall function.   REHAB POTENTIAL: Good   CLINICAL DECISION MAKING: Evolving/moderate complexity   EVALUATION COMPLEXITY: Moderate     GOALS: Goals reviewed with patient? Yes   SHORT TERM GOALS: Target date: 03/14/2022   Pt will be independent with HEP to improve strength and decrease neck pain to improve pain-free function at home and work. Baseline: 02/21/22: Baseline HEP initiated Goal status: INITIAL     LONG TERM GOALS: Target date: 04/04/2022   Pt will increase FOTO to at least 66 to demonstrate significant improvement in function at home and work related to neck pain  Baseline: 02/21/22: 53 Goal status: INITIAL   2.  Pt will decrease worst neck pain by at least 2 points on the NPRS in order to demonstrate clinically significant reduction in neck pain. Baseline: 02/21/22: 8/10 at worst Goal status: INITIAL  3.  Pt will have full cervical spine AROM without reproduction of symptoms as needed for scanning environment and performing overhead activity as needed for working on her farm and driving to access community    Baseline: 02/21/22: Motion loss with extension and L rotation, pain with flexion/extension/L rotation.   Goal status: INITIAL   4.  Pt will perform wall alphabet with hand fixed on wall without reproduction of pain indicative of improved ability to perform repetitive forward reaching activity as needed for refurbishing antique furniture  Baseline: 02/21/22: Pain with forward shoulder flexion and repetitive L upper limb movement.    Goal status: INITIAL     PLAN: PT FREQUENCY: 2x/week   PT DURATION: 6 weeks   PLANNED INTERVENTIONS: Therapeutic exercises, Therapeutic activity, Neuromuscular re-education,  Patient/Family education, Joint  mobilization, Dry Needling, Electrical stimulation, Spinal mobilization, Cryotherapy, Moist heat, Traction, and Manual therapy   PLAN FOR NEXT SESSION: F/u on response with HEP/repeated retraction, continue with traction and manual therapy for symptom modulation. Progress with extension principle management as needed pending symptomatic and ROM response. Exercise and manual techniques for cervical spine mobility and postural re-edu.     Consuela Mimes, PT, DPT #[id number]  Gertie Exon, PT 03/06/2022, 9:50 AM

## 2022-03-06 ENCOUNTER — Encounter: Payer: Self-pay | Admitting: Physical Therapy

## 2022-03-07 ENCOUNTER — Ambulatory Visit: Payer: Medicare HMO | Admitting: Physical Therapy

## 2022-03-07 DIAGNOSIS — M25512 Pain in left shoulder: Secondary | ICD-10-CM

## 2022-03-07 DIAGNOSIS — M6281 Muscle weakness (generalized): Secondary | ICD-10-CM

## 2022-03-07 DIAGNOSIS — M542 Cervicalgia: Secondary | ICD-10-CM

## 2022-03-07 NOTE — Therapy (Signed)
OUTPATIENT PHYSICAL THERAPY TREATMENT NOTE   Patient Name: Lauren Leblanc MRN: [id number] DOB:03-30-1951, 71 y.o., female Today's Date: 02/23/2022  PCP: Duanne Limerick, MD REFERRING PROVIDER: Duanne Limerick, MD  END OF SESSION:       Past Medical History:  Diagnosis Date   Angina at rest    Arthritis    GERD (gastroesophageal reflux disease)    Hypertension    Past Surgical History:  Procedure Laterality Date   BACK SURGERY  2014   CYST REMOVAL   BREAST BIOPSY Bilateral    neg   DILATION AND CURETTAGE OF UTERUS     TOTAL KNEE ARTHROPLASTY Left 04/13/2020   Procedure: TOTAL KNEE ARTHROPLASTY;  Surgeon: Christena Flake, MD;  Location: [facility] ORS;  Service: Orthopedics;  Laterality: Left;   TUBAL LIGATION     Patient Active Problem List   Diagnosis Date Noted   Statin intolerance 02/06/2022   Spondylolisthesis of lumbar region 06/18/2020   Sacroiliitis (HCC) 06/18/2020   Status post total knee replacement using cement, left 04/13/2020   Esophageal dysphagia 07/04/2019   Familial hypercholesterolemia 07/04/2019   Gastroesophageal reflux disease 07/04/2019   Obesity (BMI 30.0-34.9) 03/13/2018   Primary osteoarthritis of left knee 03/13/2018   Hyperlipidemia, mixed 04/26/2017   Chest pain 04/24/2017   Essential hypertension 01/06/2016    REFERRING DIAG: M54.13 (ICD-10-CM) - Radiculopathy of cervicothoracic region   THERAPY DIAG:  Cervicalgia  Left shoulder pain, unspecified chronicity  Muscle weakness (generalized)  Rationale for Evaluation and Treatment Rehabilitation  PERTINENT HISTORY: Patient is a 71 year old female with primary complaint of L-sided neck/shoulder pain. Pt reports she will be 2 years s/p TKA this December. She reports injuring her L shoulder when trying to offload LLE. 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 Pt reports  pain emanating from L periscapular region and down her arm to her elbow. She reports aching, burning, stinging into her L upper limb. Pt reports taking half dose of muscle relaxer due to sensitivity to medication. Pt denies current visual changes, photosensitivity or sound sensitivity. She did have headache after her head injury, but this it not primary concern at this time. 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    Pain:  Pain Intensity: Present: 2/10, Best: 2/10 (pain mitigated with tizanidine/diclofenac), Worst: 8/10 Pain location: L upper trapezius, L periscapular region and down LUE Pain Quality: burning, stinging aching  Radiating: Yes, down L upper limb Numbness/Tingling: No Focal Weakness: No Aggravating factors: lying on L side or R side, overhead activity,  Relieving factors: pillows around head/neck, Rx medication 24-hour pain behavior: None History of prior neck injury, pain, surgery, or therapy: Yes, Hx of shoulder pain prior to farm accident - exacerbated by fall  Falls: Has patient fallen in last 6 months? Yes, Number of falls: 2 times when working on farm  Follow-up appointment with MD: No Dominant hand: left Imaging: Yes   Head/neck CT 12/27/21 IMPRESSION: 1.  No intracranial large vessel occlusion or significant stenosis. 2.  No hemodynamically significant stenosis in the neck. 3. No evidence of dissection or aneurysm in the head or neck.     Prior level of function: Independent Occupational demands: pt is formally retired, works on farm tending to Firefighter  Hobbies: Psychologist, forensic flags (personal history of cancer, h/o spinal  tumors, history of compression fracture, chills/fever, night sweats, nausea, vomiting, unrelenting pain): Negative             Hx of precancerous skin lesions only, nausea acutely following head trauma in September    Precautions: Fall Hx with working on farm    [employer]  Restrictions: No   EconomistLiving Environment Lives with: lives with their spouse, daughter [place]  Lives in: House/apartment     Patient Goals: Find out cause of left upper quarter pain, getting off of medication     PRECAUTIONS: Fall Hx 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 Patient reports having some paresthesias affecting her L lateral leg Friday and Saturday. Patient reports notable soreness in her neck presently, no paresthesias today. Patient reports 5-6 pain at arrival to PT along periscapular region. Pt reports less sharp quality of pain with rotating her head to L.    PAIN:  Are you having pain? Yes: NPRS scale: 4-5/10 Pain location: R paracervical region and along R periscapular region     OBJECTIVE: (objective measures completed at initial evaluation unless otherwise dated)    Patient Surveys  FOTO 53, predicted score of 9266   Cognition Patient is oriented to person, place, and time.  Recent memory is intact.  Remote memory is intact.  Attention span and concentration are intact.  Expressive speech is intact.  Patient's fund of knowledge is within normal limits for educational level.                          Gross Musculoskeletal Assessment Tremor: None Bulk: Normal Tone: Normal     Posture Forward head, mild rounded shoulders posture   AROM   Shoulder Flexion 150 bilat (pain on L upper trap), Shoulder Abduction WNL bilat (pain in L deltoid with return to neutral position)   AROM (Normal range in degrees) AROM 02/21/2022  Cervical  Flexion (50) 55*  Extension (80) 30*  Right lateral flexion (45) 45  Left lateral flexion (45) 43  Right  rotation (85) 75  Left rotation (85) 57*  (* = pain; Blank rows = not tested)       MMT   MMT (out of 5) Right 02/21/2022 Left 02/21/2022  Shoulder   Flexion 4 4  Extension      Abduction 4 4  Internal rotation      Horizontal abduction      Horizontal adduction      Lower Trapezius      Rhomboids             Elbow  Flexion 4+ 4+  Extension 5 4+  Pronation      Supination             Wrist  Flexion 5 5  Extension 5 5  Radial deviation      Ulnar deviation      (* = pain; Blank rows = not tested)   Sensation Grossly intact to light touch bilateral UE as determined by testing dermatomes C2-T2. Proprioception and hot/cold testing deferred on this date.   Reflexes R/L Elbow: 2+/2+  Brachioradialis: 2+/2+  Tricep: 1+/1+ Patellar Tendon 2+/2+ Achilles 2+/1+ Clonus Negative bilat    Palpation   Location LEFT  RIGHT           Suboccipitals      Cervical paraspinals 1 0  Upper Trapezius 2    Levator Scapulae 2    Rhomboid Major/Minor 2    Infraspinatus 2    (Blank rows = not tested) Graded on 0-4 scale (0 = no pain, 1 = pain, 2 = pain with wincing/grimacing/flinching, 3 = pain with withdrawal, 4 = unwilling to allow palpation), (Blank rows = not tested)     Repeated Movements Repeated cervical retraction in supine: no effect during, minimal symptoms after when lying in supine     SPECIAL TESTS Spurlings A (ipsilateral lateral flexion/axial compression): R: Negative L: Positive Distraction Test: Positive for modest pain relief   Cervical spine compression: Negative Hoffman Sign (cervical cord compression): R: Negative L: Negative    PASSIVE ACCESSORY JOINT MOBILITY Hypomobile CPA C5-7 with moderate pain at restriction. Decreased L to R sideglide C4-7 with mild pain at restriction.        TODAY'S TREATMENT    OBJECTIVE FINDINGS  AROM Cervical flexion:  WNL Cervical extension: significant motion loss* Lateral flexion: Right moderate  motion loss , Left moderate motion loss* Cervical rotation: Right WNL, Left mild motion loss* *Indicates pain   Therapeutic Exercise - for improved soft tissue flexibility and extensibility as needed for ROM, improved strength as needed to improve performance of CKC activities/functional movements  Cervical retraction, with patient overpressure; 2x10; increase in pressure along posterior C-spine during, no change after Cervical retraction, with patient overpressure; 1x10; increase in pressure along posterior C-spine during, no change after  PATIENT EDUCATION: Discussed ongoing use of medication as directed by physician for symptom management and use of heat at home prn. Discussed centralization phenomenon and possible abatement of peripheral symptoms prior to axial and paracervical symptoms   *not today* Cervical retraction; 2x10; increase in pain along L arm during, no worse after     Manual Therapy - for symptom modulation, soft tissue sensitivity and mobility, joint mobility, nerve root decompression for pain reduction    Manual cervical traction; 10 sec on, 10 sec off; with patient in supine; x 8 minutes  -good response during initial 5 minutes  -persisting L arm throbbing during last 2 minutes of traction   Cervical sideglides; emphasis on L to R; gr II for pain control x 30 sec, gr III for joint mobility x 30 sec at each level along C4-6  STM/DTM left UT, levator scapulae, cervical paraspinals x 10  minutes   *hold today* Manual cervical traction + retraction; x 10 repetitions, 1 sec hold at end-range (no overpressure)     Trigger Point Dry Needling (TDN), unbilled Education performed with patient regarding potential benefit of TDN as well as pertinent precautions and risks with patient at previous session. Pt provided verbal consent to treatment. TDN performed to left splenius cervicis at C5-6, left C5 multifidus, left upper trapezius, left rhomboid major, and left  infraspinatus with 0.25 x 40 single needle placements with local twitch response (LTR). Pistoning technique utilized. Notable post-DN soreness noted today.    *not today* Cervical sidebend stretch; reviewed     MHP (unbilled) utilized post-treatment for analgesic effect and improved soft tissue extensibility, along R upper trapezius and R proximal arm in sitting; x 8 minutes.    PATIENT EDUCATION:  Education details: Plan of care, HEP Person educated: Patient Education method: Explanation Education comprehension: verbalized understanding     HOME EXERCISE PROGRAM: Access Code: [email or web address] URL: [email or web address] Date: 02/21/2022 Prepared by: Consuela Mimes   Exercises - Seated Cervical Retraction  - 5-6 x daily - 7 x weekly - 1 sets - 10 reps - sec hold - Seated Upper Trapezius Stretch  - 2 x daily - 7 x weekly - 3 sets - 30sec hold - Seated Scapular Retraction  - 2 x daily - 7 x weekly - 2 sets - 10 reps - 3sec hold     ASSESSMENT:   CLINICAL IMPRESSION:  Patient fortunately has improved positional tolerance for prone lying today on treatment table in private treatment room. She has ongoing pain that is of lesser intensity than earlier this week. Pt has ongoing significant motion deficits, but L cervical spine rotation is improved today. Utilized TDN for symptom modulation and to allow for increased cervical spine mobility. Pt tolerates session well with sound twitch responses obtained along L splenius cervicis, L upper trapezius, L rhomboid major, and L infraspinatus. Treated infraspinatus for potential pain modulation via TrP model given pseudoradicular pain that can eminate from infraspinatus. Patient has remaining deficits in decreased cervical spine AROM, pain with shoulder elevation, dec shoulder flexion/ABD strength, postural changes, and sensitivity along L upper trap/rhomboid maj/posterior cuff mm. 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 Patient will benefit from skilled continued PT to address above impairments and improve overall function.   REHAB POTENTIAL: Good   CLINICAL DECISION MAKING: Evolving/moderate complexity   EVALUATION COMPLEXITY: Moderate     GOALS: Goals reviewed with patient? Yes   SHORT TERM GOALS: Target date: 03/14/2022   Pt will be independent with HEP to improve strength and decrease neck pain to improve pain-free function at home and work. Baseline: 02/21/22: Baseline HEP initiated Goal status: INITIAL     LONG TERM GOALS: Target date: 04/04/2022   Pt will increase FOTO to at least 66 to demonstrate significant improvement in function at home and work related to neck pain  Baseline: 02/21/22: 53 Goal status: INITIAL   2.  Pt will decrease worst neck pain by at least 2 points on the NPRS in order to demonstrate clinically significant reduction in neck pain. Baseline: 02/21/22: 8/10 at worst Goal status: INITIAL   3.  Pt will have full cervical spine AROM without reproduction of symptoms as needed for scanning environment and performing overhead activity as needed for working on her farm and driving to access community    Baseline: 02/21/22: Motion loss with extension and L rotation, pain with flexion/extension/L rotation.  Goal status: INITIAL   4.  Pt will perform wall alphabet with hand fixed on wall without reproduction of pain indicative of improved ability to perform repetitive forward reaching activity as needed for refurbishing antique furniture  Baseline: 02/21/22: Pain with forward shoulder flexion and repetitive L upper limb movement.    Goal status: INITIAL     PLAN: PT FREQUENCY: 2x/week   PT DURATION: 6 weeks   PLANNED INTERVENTIONS: Therapeutic exercises, Therapeutic activity, Neuromuscular re-education,  Patient/Family education, Joint mobilization, Dry Needling, Electrical stimulation, Spinal mobilization, Cryotherapy, Moist heat, Traction, and Manual  therapy   PLAN FOR NEXT SESSION: F/u on response with HEP/repeated retraction, continue with traction and manual therapy for symptom modulation. Progress with extension principle management as needed pending symptomatic and ROM response. Exercise and manual techniques for cervical spine mobility and postural re-edu.     Consuela Mimes, PT, DPT #[id number]  Gertie Exon, PT 03/07/2022, 10:55 AM

## 2022-03-09 ENCOUNTER — Encounter: Payer: Self-pay | Admitting: Physical Therapy

## 2022-03-09 ENCOUNTER — Ambulatory Visit: Payer: Medicare HMO | Admitting: Physical Therapy

## 2022-03-09 DIAGNOSIS — M6281 Muscle weakness (generalized): Secondary | ICD-10-CM | POA: Diagnosis not present

## 2022-03-09 DIAGNOSIS — M542 Cervicalgia: Secondary | ICD-10-CM

## 2022-03-09 DIAGNOSIS — M25512 Pain in left shoulder: Secondary | ICD-10-CM | POA: Diagnosis not present

## 2022-03-09 NOTE — Therapy (Signed)
OUTPATIENT PHYSICAL THERAPY TREATMENT NOTE   Patient Name: Lauren Leblanc MRN: [id number] DOB:01-11-1951, 71 y.o., female Today's Date: 02/23/2022  PCP: Juline Patch, MD REFERRING PROVIDER: Juline Patch, MD  END OF SESSION:   PT End of Session - 03/11/22 2137     Visit Number 6    Number of Visits 13    Date for PT Re-Evaluation 04/04/22    Authorization Type [employer] 2023    Authorization Time Period VL based on medical necessity    Progress Note Due on Visit 10    PT Start Time 1346    PT Stop Time 1437    PT Time Calculation (min) 51 min    Activity Tolerance Patient tolerated treatment well    Behavior During Therapy WFL for tasks assessed/performed                 Past Medical History:  Diagnosis Date   Angina at rest    Arthritis    GERD (gastroesophageal reflux disease)    Hypertension    Past Surgical History:  Procedure Laterality Date   BACK SURGERY  2014   CYST REMOVAL   BREAST BIOPSY Bilateral    neg   DILATION AND CURETTAGE OF UTERUS     TOTAL KNEE ARTHROPLASTY Left 04/13/2020   Procedure: TOTAL KNEE ARTHROPLASTY;  Surgeon: Corky Mull, MD;  Location: [facility] ORS;  Service: Orthopedics;  Laterality: Left;   TUBAL LIGATION     Patient Active Problem List   Diagnosis Date Noted   Statin intolerance 02/06/2022   Spondylolisthesis of lumbar region 06/18/2020   Sacroiliitis ([place]) 06/18/2020   Status post total knee replacement using cement, left 04/13/2020   Esophageal dysphagia 07/04/2019   Familial hypercholesterolemia 07/04/2019   Gastroesophageal reflux disease 07/04/2019   Obesity (BMI 30.0-34.9) 03/13/2018   Primary osteoarthritis of left knee 03/13/2018   Hyperlipidemia, mixed 04/26/2017   Chest pain 04/24/2017   Essential hypertension 01/06/2016    REFERRING DIAG: M54.13 (ICD-10-CM) - Radiculopathy of cervicothoracic region   THERAPY DIAG:  Cervicalgia  Left shoulder pain, unspecified chronicity  Muscle  weakness (generalized)  Rationale for Evaluation and Treatment Rehabilitation  PERTINENT HISTORY: Patient is a 71 year old female with primary complaint of L-sided neck/shoulder pain. Pt reports she will be 2 years s/p TKA this December. She reports injuring her L shoulder when trying to offload LLE. 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 Pt reports pain emanating from L periscapular region and down her arm to her elbow. She reports aching, burning, stinging into her L upper limb. Pt reports taking half dose of muscle relaxer due to sensitivity to medication. Pt denies current visual changes, photosensitivity or sound sensitivity. She did have headache after her head injury, but this it not primary concern at this time. 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    Pain:  Pain Intensity: Present: 2/10, Best: 2/10 (pain mitigated with tizanidine/diclofenac), Worst: 8/10 Pain location: L upper trapezius, L periscapular region and down LUE Pain Quality: burning, stinging aching  Radiating: Yes, down L upper limb Numbness/Tingling: No Focal Weakness: No Aggravating factors: lying on L side or R side, overhead activity,  Relieving factors: pillows around head/neck, Rx medication 24-hour pain behavior: None History of prior neck injury, pain, surgery, or therapy: Yes, Hx of shoulder pain  prior to farm accident - exacerbated by fall  Falls: Has patient fallen in last 6 months? Yes, Number of falls: 2 times when working on farm  Follow-up appointment with MD: No Dominant hand: left Imaging: Yes   Head/neck CT 12/27/21 IMPRESSION: 1.  No intracranial large vessel occlusion or significant stenosis. 2.  No hemodynamically significant stenosis in the neck. 3. No evidence of dissection or aneurysm in the head or neck.     Prior level of function:  Independent Occupational demands: pt is formally retired, works on farm tending to Insurance underwriter: Psychologist, forensic flags (personal history of cancer, h/o spinal tumors, history of compression fracture, chills/fever, night sweats, nausea, vomiting, unrelenting pain): Negative             Hx of precancerous skin lesions only, nausea acutely following head trauma in September    Precautions: Fall Hx with working on farm    [employer]earing Restrictions: No   Economist Lives with: lives with their spouse, daughter and her family live nearby in [place]  Lives in: House/apartment     Patient Goals: Find out cause of left upper quarter pain, getting off of medication     PRECAUTIONS: Fall Hx 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 Patient reports "a little sore" feeling in her neck posteriorly. Pt reports that L periscapular region is "not as sharp" at this time. Patient reports no significant R arm pain presently. 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    PAIN:  Are you having pain? Yes: NPRS scale: 4/10 Pain location: R paracervical region and along R periscapular region     OBJECTIVE: (objective measures completed at initial evaluation unless otherwise dated)    Patient Surveys  FOTO 53, predicted score of 73   Cognition Patient is oriented to person, place, and time.  Recent memory is intact.  Remote memory is intact.  Attention span and concentration are intact.  Expressive speech is intact.  Patient's fund of knowledge is within normal limits for educational level.                          Gross Musculoskeletal Assessment Tremor:  None Bulk: Normal Tone: Normal     Posture Forward head, mild rounded shoulders posture  AROM   Shoulder Flexion 150 bilat (pain on L upper trap), Shoulder Abduction WNL bilat (pain in L deltoid with return to neutral position)   AROM (Normal range in degrees) AROM 02/21/2022  Cervical  Flexion (50) 55*  Extension (80) 30*  Right lateral flexion (45) 45  Left lateral flexion (45) 43  Right rotation (85) 75  Left rotation (85) 57*  (* = pain; Blank rows = not tested)       MMT   MMT (out of 5) Right 02/21/2022 Left 02/21/2022         Shoulder   Flexion 4 4  Extension      Abduction 4 4  Internal rotation      Horizontal abduction      Horizontal adduction      Lower Trapezius      Rhomboids             Elbow  Flexion 4+ 4+  Extension 5 4+  Pronation      Supination             Wrist  Flexion 5 5  Extension 5 5  Radial deviation      Ulnar deviation      (* = pain; Blank rows = not tested)   Sensation Grossly intact to light touch bilateral UE as determined by testing dermatomes C2-T2. Proprioception and hot/cold testing deferred on this date.   Reflexes R/L Elbow: 2+/2+  Brachioradialis: 2+/2+  Tricep: 1+/1+ Patellar Tendon 2+/2+ Achilles 2+/1+ Clonus Negative bilat    Palpation   Location LEFT  RIGHT           Suboccipitals      Cervical paraspinals 1 0  Upper Trapezius 2    Levator Scapulae 2    Rhomboid Major/Minor 2    Infraspinatus 2    (Blank rows = not tested) Graded on 0-4 scale (0 = no pain, 1 = pain, 2 = pain with wincing/grimacing/flinching, 3 = pain with withdrawal, 4 = unwilling to allow palpation), (Blank rows = not tested)     Repeated Movements Repeated cervical retraction in supine: no effect during, minimal symptoms after when lying in supine     SPECIAL TESTS Spurlings A (ipsilateral lateral flexion/axial compression): R: Negative L: Positive Distraction Test: Positive for modest pain relief   Cervical spine  compression: Negative Hoffman Sign (cervical cord compression): R: Negative L: Negative    PASSIVE ACCESSORY JOINT MOBILITY Hypomobile CPA C5-7 with moderate pain at restriction. Decreased L to R sideglide C4-7 with mild pain at restriction.        TODAY'S TREATMENT    OBJECTIVE FINDINGS  AROM Cervical flexion:  WNL Cervical extension: significant motion loss* Lateral flexion: Right moderate motion loss , Left moderate motion loss* Cervical rotation: Right WNL, Left mild motion loss* *Indicates pain   Therapeutic Exercise - for improved soft tissue flexibility and extensibility as needed for ROM, improved strength as needed to improve performance of CKC activities/functional movements  Cervical retraction, with patient overpressure; 2x10; increase in pressure along posterior C-spine during, no change after    *not today* Levator scapulae stretch; 2x30 sec on either side Cervical retraction, with patient overpressure; 1x10; increase in pressure along posterior C-spine during, no change after Cervical retraction; 2x10; increase in pain along L arm during, no worse after     Manual Therapy - for symptom modulation, soft tissue sensitivity and mobility, joint mobility, nerve root decompression for pain reduction   Manual cervical traction  with cervical flexion 45 deg; 10 sec on, 5 sec off; x 5 minutes   Cervical sideglides; emphasis on L to R; gr II for pain control x 30 sec, gr III for joint mobility x 30 sec at each level along C4-6  STM/DTM left UT, levator scapulae, cervical paraspinals x 10 minutes   *hold today* Manual cervical traction + retraction; x 10 repetitions, 1 sec hold at end-range (no overpressure)  *not today* Cervical sidebend stretch; reviewed      Trigger Point Dry Needling (TDN), unbilled Education performed with patient regarding potential benefit of TDN as well as pertinent precautions and risks with patient at previous session. Pt provided  verbal consent to treatment. TDN performed to left upper trapezius, left levator scapulae, left rhomboid major with 0.25 x 40 single needle placements with local twitch response (LTR). Pistoning technique utilized. Notable post-DN soreness noted today.      MHP (unbilled) utilized post-treatment for analgesic effect and improved soft tissue extensibility, along R upper trapezius and R proximal arm in sitting; x 8 minutes.    PATIENT EDUCATION:  Education details: Plan of care, HEP Person educated: Patient Education method: Explanation Education comprehension: verbalized understanding     HOME EXERCISE PROGRAM: Access Code: [email or web address] URL: [email or web address] Date: 02/21/2022 Prepared by: Consuela Mimes   Exercises - Seated Cervical Retraction  - 5-6 x daily - 7 x weekly - 1 sets - 10 reps - sec hold - Seated Upper Trapezius Stretch  - 2 x daily - 7 x weekly - 3 sets - 30sec hold - Seated Scapular Retraction  - 2 x daily - 7 x weekly - 2 sets - 10 reps - 3sec hold     ASSESSMENT:   CLINICAL IMPRESSION:  Patient reports improvement in her condition that is modest. She demonstrates ongoing significant cervical spine stiffness, though L rotation ROM is improving. She still has significant deficit with closing on her L side. Pt has improved response to traction with addition of slight flexion to emphasize lower cervical spine. Utilized TDN to treat trigger points along left upper trapezius and periscapular musculature, but she feels she wants to hold on paracervical needling due to recent episode of bruising/swelling in this area. We may consider dry needling along peripheral site with shared innervation with associated spinal levels as alternative to direct treatment at cervical spine for desensitization at affected nerve root levels. Patient has remaining deficits in decreased cervical spine AROM, pain with shoulder elevation, dec shoulder flexion/ABD strength, postural  changes, and sensitivity along L upper trap/rhomboid maj/posterior cuff mm. 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 Patient will benefit from skilled continued PT to address above impairments and improve overall function.   REHAB POTENTIAL: Good   CLINICAL DECISION MAKING: Evolving/moderate complexity   EVALUATION COMPLEXITY: Moderate     GOALS: Goals reviewed with patient? Yes   SHORT TERM GOALS: Target date: 03/14/2022   Pt will be independent with HEP to improve strength and decrease neck pain to improve pain-free function at home and work. Baseline: 02/21/22: Baseline HEP initiated Goal status: INITIAL     LONG TERM GOALS: Target date: 04/04/2022   Pt will increase FOTO to at least 66 to demonstrate significant improvement in function at home and work related to neck pain  Baseline: 02/21/22: 53 Goal status: INITIAL   2.  Pt will decrease worst neck pain by at least 2 points on the NPRS in order to demonstrate clinically significant reduction in neck pain.  Baseline: 02/21/22: 8/10 at worst Goal status: INITIAL   3.  Pt will have full cervical spine AROM without reproduction of symptoms as needed for scanning environment and performing overhead activity as needed for working on her farm and driving to access community    Baseline: 02/21/22: Motion loss with extension and L rotation, pain with flexion/extension/L rotation.   Goal status: INITIAL   4.  Pt will perform wall alphabet with hand fixed on wall without reproduction of pain indicative of improved ability to perform repetitive forward reaching activity as needed for refurbishing antique furniture  Baseline: 02/21/22: Pain with forward shoulder flexion and repetitive L upper limb movement.    Goal status: INITIAL     PLAN: PT FREQUENCY: 2x/week   PT DURATION: 6 weeks   PLANNED INTERVENTIONS: Therapeutic exercises, Therapeutic activity, Neuromuscular re-education,  Patient/Family  education, Joint mobilization, Dry Needling, Electrical stimulation, Spinal mobilization, Cryotherapy, Moist heat, Traction, and Manual therapy   PLAN FOR NEXT SESSION: F/u on response with HEP/repeated retraction, continue with traction and manual therapy for symptom modulation. Progress with extension principle management as needed pending symptomatic and ROM response. Exercise and manual techniques for cervical spine mobility and postural re-edu.     Valentina Gu, PT, DPT [id number]  Eilleen Kempf, PT 03/11/2022, 9:38 PM

## 2022-03-14 ENCOUNTER — Ambulatory Visit: Payer: Medicare HMO | Admitting: Physical Therapy

## 2022-03-14 DIAGNOSIS — M6281 Muscle weakness (generalized): Secondary | ICD-10-CM | POA: Diagnosis not present

## 2022-03-14 DIAGNOSIS — M542 Cervicalgia: Secondary | ICD-10-CM

## 2022-03-14 DIAGNOSIS — M25512 Pain in left shoulder: Secondary | ICD-10-CM | POA: Diagnosis not present

## 2022-03-14 NOTE — Therapy (Signed)
OUTPATIENT PHYSICAL THERAPY TREATMENT NOTE   Patient Name: Lauren Leblanc MRN: [id number] DOB:[birth year], 71 y.o., female Today's Date: 02/23/2022  PCP: Juline Patch, MD REFERRING PROVIDER: Juline Patch, MD  END OF SESSION:   PT End of Session - 03/14/22 1334     Visit Number 7    Number of Visits 13    Date for PT Re-Evaluation 04/04/22    Authorization Type [employer] 2023    Authorization Time Period VL based on medical necessity    Progress Note Due on Visit 10    PT Start Time 1340    PT Stop Time 1425    PT Time Calculation (min) 45 min    Activity Tolerance Patient tolerated treatment well    Behavior During Therapy WFL for tasks assessed/performed                 Past Medical History:  Diagnosis Date   Angina at rest    Arthritis    GERD (gastroesophageal reflux disease)    Hypertension    Past Surgical History:  Procedure Laterality Date   BACK SURGERY  2014   CYST REMOVAL   BREAST BIOPSY Bilateral    neg   DILATION AND CURETTAGE OF UTERUS     TOTAL KNEE ARTHROPLASTY Left 04/13/2020   Procedure: TOTAL KNEE ARTHROPLASTY;  Surgeon: Corky Mull, MD;  Location: [facility] ORS;  Service: Orthopedics;  Laterality: Left;   TUBAL LIGATION     Patient Active Problem List   Diagnosis Date Noted   Statin intolerance 02/06/2022   Spondylolisthesis of lumbar region 06/18/2020   Sacroiliitis ([place]) 06/18/2020   Status post total knee replacement using cement, left 04/13/2020   Esophageal dysphagia 07/04/2019   Familial hypercholesterolemia 07/04/2019   Gastroesophageal reflux disease 07/04/2019   Obesity (BMI 30.0-34.9) 03/13/2018   Primary osteoarthritis of left knee 03/13/2018   Hyperlipidemia, mixed 04/26/2017   Chest pain 04/24/2017   Essential hypertension 01/06/2016    REFERRING DIAG: M54.13 (ICD-10-CM) - Radiculopathy of cervicothoracic region   THERAPY DIAG:  Cervicalgia  Left shoulder pain, unspecified chronicity  Rationale for  Evaluation and Treatment Rehabilitation  PERTINENT HISTORY: Patient is a 71 year old female with primary complaint of L-sided neck/shoulder pain. Pt reports she will be 2 years s/p TKA this December. She reports injuring her L shoulder when trying to offload LLE. 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 Pt reports pain emanating from L periscapular region and down her arm to her elbow. She reports aching, burning, stinging into her L upper limb. Pt reports taking half dose of muscle relaxer due to sensitivity to medication. Pt denies current visual changes, photosensitivity or sound sensitivity. She did have headache after her head injury, but this it not primary concern at this time. 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    Pain:  Pain Intensity: Present: 2/10, Best: 2/10 (pain mitigated with tizanidine/diclofenac), Worst: 8/10 Pain location: L upper trapezius, L periscapular region and down LUE Pain Quality: burning, stinging aching  Radiating: Yes, down L upper limb Numbness/Tingling: No Focal Weakness: No Aggravating factors: lying on L side or R side, overhead activity,  Relieving factors: pillows around head/neck, Rx medication 24-hour pain behavior: None History of prior neck injury, pain, surgery, or therapy: Yes, Hx of shoulder pain prior to farm accident -  exacerbated by fall  Falls: Has patient fallen in last 6 months? Yes, Number of falls: 2 times when working on farm  Follow-up appointment with MD: No Dominant hand: left Imaging: Yes   Head/neck CT 12/27/21 IMPRESSION: 1.  No intracranial large vessel occlusion or significant stenosis. 2.  No hemodynamically significant stenosis in the neck. 3. No evidence of dissection or aneurysm in the head or neck.     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, music flags (personal history of cancer, h/o spinal tumors, history of compression fracture, chills/fever, night sweats, nausea, vomiting, unrelenting pain): Negative             Hx of precancerous skin lesions only, nausea acutely following head trauma in September    Precautions: Fall Hx with working on farm    [employer] Restrictions: No   Financial controller Lives with: lives with their spouse, daughter and her family live nearby in [place]  Lives in: House/apartment     Patient Goals: Find out cause of left upper quarter pain, getting off of medication     PRECAUTIONS: Fall Hx 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 She reports slight pain affecting her R arm. She reports she feels it "when thinking about it." She reports not using muscle relaxer or anti-inflammatory over last 2 weeks.     PAIN:  Are you having pain? Yes: NPRS scale: 3/10 Pain location: R paracervical region and along R periscapular region     OBJECTIVE: (objective measures completed at initial evaluation unless otherwise dated)    Patient Surveys  FOTO 53, predicted score of 48   Cognition Patient is oriented to person, place, and time.  Recent memory is intact.  Remote memory is intact.  Attention span and concentration are intact.  Expressive speech is intact.  Patient's fund of knowledge is within normal limits for educational level.                          Gross Musculoskeletal Assessment Tremor: None Bulk: Normal Tone: Normal     Posture Forward head, mild rounded shoulders posture   AROM   Shoulder Flexion 150 bilat (pain on  L upper trap), Shoulder Abduction WNL bilat (pain in  L deltoid with return to neutral position)   AROM (Normal range in degrees) AROM 02/21/2022  Cervical  Flexion (50) 55*  Extension (80) 30*  Right lateral flexion (45) 45  Left lateral flexion (45) 43  Right rotation (85) 75  Left rotation (85) 57*  (* = pain; Blank rows = not tested)       MMT   MMT (out of 5) Right 02/21/2022 Left 02/21/2022         Shoulder   Flexion 4 4  Extension      Abduction 4 4  Internal rotation      Horizontal abduction      Horizontal adduction      Lower Trapezius      Rhomboids             Elbow  Flexion 4+ 4+  Extension 5 4+  Pronation      Supination             Wrist  Flexion 5 5  Extension 5 5  Radial deviation      Ulnar deviation      (* = pain; Blank rows = not tested)   Sensation Grossly intact to light touch bilateral UE as determined by testing dermatomes C2-T2. Proprioception and hot/cold testing deferred on this date.   Reflexes R/L Elbow: 2+/2+  Brachioradialis: 2+/2+  Tricep: 1+/1+ Patellar Tendon 2+/2+ Achilles 2+/1+ Clonus Negative bilat    Palpation   Location LEFT  RIGHT           Suboccipitals      Cervical paraspinals 1 0  Upper Trapezius 2    Levator Scapulae 2    Rhomboid Major/Minor 2    Infraspinatus 2    (Blank rows = not tested) Graded on 0-4 scale (0 = no pain, 1 = pain, 2 = pain with wincing/grimacing/flinching, 3 = pain with withdrawal, 4 = unwilling to allow palpation), (Blank rows = not tested)     Repeated Movements Repeated cervical retraction in supine: no effect during, minimal symptoms after when lying in supine     SPECIAL TESTS Spurlings A (ipsilateral lateral flexion/axial compression): R: Negative L: Positive Distraction Test: Positive for modest pain relief   Cervical spine compression: Negative Hoffman Sign (cervical cord compression): R: Negative L: Negative    PASSIVE ACCESSORY JOINT MOBILITY Hypomobile CPA  C5-7 with moderate pain at restriction. Decreased L to R sideglide C4-7 with mild pain at restriction.        TODAY'S TREATMENT    OBJECTIVE FINDINGS  AROM Cervical flexion:  WNL* Cervical extension: significant motion loss* Lateral flexion: Right WNL, Left WNL Cervical rotation: Right WNL, Left WNL *Indicates pain   Therapeutic Exercise - for improved soft tissue flexibility and extensibility as needed for ROM, cervical spine mobility as needed for neck motion/scanning environment, postural re-edu  Standing row with maintenance of upright posture and cerv retraction; Green Tband; 2x10, 5 sec hold  Bruegger's; postural endurance drill with upright sitting position, maintaining "W" position of upper extremities  -initiated with Red Tband, modified to yellow Tband due to R shoulder discomfort  -performed x 5 reps, 10 sec hold for muscular endurance  Seated cervical extension SNAG; 2x10, 1 sec hold at barrier   PATIENT EDUCATION: Discussed current progress with PT, expectations with repeated movement and concept of referred pain from cervical spine. Discussed transition to MDT maintenance phase if symptoms remain under control for up to 30 days.     *not today* Cervical  retraction, with patient overpressure; 2x10; increase in pressure along posterior C-spine during, no change after Levator scapulae stretch; 2x30 sec on either side Cervical retraction, with patient overpressure; 1x10; increase in pressure along posterior C-spine during, no change after Cervical retraction; 2x10; increase in pain along L arm during, no worse after     Manual Therapy - for symptom modulation, soft tissue sensitivity and mobility, joint mobility, nerve root decompression for pain reduction   Manual cervical traction with cervical flexion 45 deg; 10 sec on, 5 sec off; x 8 minutes   Cervical sideglides; performed bilaterally today; gr II for pain control x 30 sec, gr III for joint mobility 2x30 sec  at each level along C4-6  STM/DTM left UT, levator scapulae, cervical paraspinals x 5 minutes   *hold today* Manual cervical traction + retraction; x 10 repetitions, 1 sec hold at end-range (no overpressure)   *not today* Cervical sidebend stretch; reviewed  Trigger Point Dry Needling (TDN), unbilled Education performed with patient regarding potential benefit of TDN as well as pertinent precautions and risks with patient at previous session. Pt provided verbal consent to treatment. TDN performed to left upper trapezius, left levator scapulae, left rhomboid major with 0.25 x 40 single needle placements with local twitch response (LTR). Pistoning technique utilized. Notable post-DN soreness noted today.   MHP (unbilled) utilized post-treatment for analgesic effect and improved soft tissue extensibility, along R upper trapezius and R proximal arm in sitting; x 8 minutes.       PATIENT EDUCATION:  Education details: Plan of care, HEP Person educated: Patient Education method: Explanation Education comprehension: verbalized understanding     HOME EXERCISE PROGRAM: Access Code: [email or web address] URL: [email or web address] Date: 02/21/2022 Prepared by: Valentina Gu   Exercises - Seated Cervical Retraction  - 5-6 x daily - 7 x weekly - 1 sets - 10 reps - sec hold - Seated Upper Trapezius Stretch  - 2 x daily - 7 x weekly - 3 sets - 30sec hold - Seated Scapular Retraction  - 2 x daily - 7 x weekly - 2 sets - 10 reps - 3sec hold     ASSESSMENT:   CLINICAL IMPRESSION:  Patient has much improved cervical spine AROM at this time. She reports mild symptoms at this time, similar referral pattern to left upper trapezius and left upper arm. Symptoms are mitigated with use of manual traction with slight cervical flexion to bias lower C-spine. Patient tolerates postural drills fairly well today, but she does have comorbid R shoulder discomfort with Bruegger's position - this was  modified to lower-intensity due to this. Patient has remaining deficits in decreased cervical spine AROM, pain with shoulder elevation, dec shoulder flexion/ABD strength, postural changes, and sensitivity along L upper trap/rhomboid maj/posterior cuff mm. 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 Patient will benefit from skilled continued PT to address above impairments and improve overall function.   REHAB POTENTIAL: Good   CLINICAL DECISION MAKING: Evolving/moderate complexity   EVALUATION COMPLEXITY: Moderate     GOALS: Goals reviewed with patient? Yes   SHORT TERM GOALS: Target date: 03/14/2022   Pt will be independent with HEP to improve strength and decrease neck pain to improve pain-free function at home and work. Baseline: 02/21/22: Baseline HEP initiated Goal status: INITIAL     LONG TERM GOALS: Target date: 04/04/2022   Pt will increase FOTO to at least 66 to demonstrate significant improvement in function at home and work related to neck pain  Baseline: 02/21/22: 53 Goal status: INITIAL   2.  Pt will decrease worst neck pain by at least 2 points on the NPRS in order to demonstrate clinically significant reduction in neck pain. Baseline: 02/21/22: 8/10 at worst Goal status: INITIAL   3.  Pt will have full cervical spine AROM without reproduction of symptoms as needed for scanning environment and performing overhead activity as needed for working on her farm and driving to access community    Baseline: 02/21/22: Motion loss with extension and L rotation, pain with flexion/extension/L rotation.   Goal status: INITIAL   4.  Pt will perform wall alphabet with hand fixed on wall without reproduction of pain indicative of improved ability to perform repetitive forward reaching activity as needed for refurbishing antique furniture  Baseline: 02/21/22: Pain with forward shoulder flexion and repetitive L upper limb movement.    Goal status:  INITIAL     PLAN: PT FREQUENCY: 2x/week   PT DURATION: 6 weeks   PLANNED INTERVENTIONS: Therapeutic exercises, Therapeutic activity, Neuromuscular re-education,  Patient/Family education, Joint mobilization, Dry Needling, Electrical stimulation, Spinal mobilization, Cryotherapy, Moist heat, Traction, and Manual therapy   PLAN FOR NEXT SESSION: F/u on response with HEP/repeated retraction, continue with traction and manual therapy for symptom modulation. Progress with extension principle management as needed pending symptomatic and ROM response. Exercise and manual techniques for cervical spine mobility and postural re-edu.     Consuela Mimes, PT, DPT #[id number]  Gertie Exon, PT 03/14/2022, 2:39 PM

## 2022-03-21 ENCOUNTER — Ambulatory Visit: Payer: Medicare HMO | Admitting: Physical Therapy

## 2022-03-21 DIAGNOSIS — M6281 Muscle weakness (generalized): Secondary | ICD-10-CM

## 2022-03-21 DIAGNOSIS — M542 Cervicalgia: Secondary | ICD-10-CM | POA: Diagnosis not present

## 2022-03-21 DIAGNOSIS — M25512 Pain in left shoulder: Secondary | ICD-10-CM

## 2022-03-21 NOTE — Therapy (Signed)
OUTPATIENT PHYSICAL THERAPY TREATMENT NOTE   Patient Name: Lauren Leblanc MRN: [id number] DOB:1950/05/13, 71 y.o., female Today's Date: 02/23/2022  PCP: Duanne Limerick, MD REFERRING PROVIDER: Duanne Limerick, MD  END OF SESSION:   PT End of Session - 03/21/22 1429     Visit Number 8    Number of Visits 13    Date for PT Re-Evaluation 04/04/22    Authorization Type [employer] 2023    Authorization Time Period VL based on medical necessity    Progress Note Due on Visit 10    PT Start Time 1349    PT Stop Time 1438    PT Time Calculation (min) 49 min    Activity Tolerance Patient tolerated treatment well    Behavior During Therapy WFL for tasks assessed/performed                  Past Medical History:  Diagnosis Date   Angina at rest    Arthritis    GERD (gastroesophageal reflux disease)    Hypertension    Past Surgical History:  Procedure Laterality Date   BACK SURGERY  2014   CYST REMOVAL   BREAST BIOPSY Bilateral    neg   DILATION AND CURETTAGE OF UTERUS     TOTAL KNEE ARTHROPLASTY Left 04/13/2020   Procedure: TOTAL KNEE ARTHROPLASTY;  Surgeon: Christena Flake, MD;  Location: [facility] ORS;  Service: Orthopedics;  Laterality: Left;   TUBAL LIGATION     Patient Active Problem List   Diagnosis Date Noted   Statin intolerance 02/06/2022   Spondylolisthesis of lumbar region 06/18/2020   Sacroiliitis (HCC) 06/18/2020   Status post total knee replacement using cement, left 04/13/2020   Esophageal dysphagia 07/04/2019   Familial hypercholesterolemia 07/04/2019   Gastroesophageal reflux disease 07/04/2019   Obesity (BMI 30.0-34.9) 03/13/2018   Primary osteoarthritis of left knee 03/13/2018   Hyperlipidemia, mixed 04/26/2017   Chest pain 04/24/2017   Essential hypertension 01/06/2016    REFERRING DIAG: M54.13 (ICD-10-CM) - Radiculopathy of cervicothoracic region   THERAPY DIAG:  Cervicalgia  Left shoulder pain, unspecified chronicity  Muscle  weakness (generalized)  Rationale for Evaluation and Treatment Rehabilitation  PERTINENT HISTORY: Patient is a 71 year old female with primary complaint of L-sided neck/shoulder pain. Pt reports she will be 2 years s/p TKA this December. She reports injuring her L shoulder when trying to offload LLE. 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 Pt reports pain emanating from L periscapular region and down her arm to her elbow. She reports aching, burning, stinging into her L upper limb. Pt reports taking half dose of muscle relaxer due to sensitivity to medication. Pt denies current visual changes, photosensitivity or sound sensitivity. She did have headache after her head injury, but this it not primary concern at this time. 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    Pain:  Pain Intensity: Present: 2/10, Best: 2/10 (pain mitigated with tizanidine/diclofenac), Worst: 8/10 Pain location: L upper trapezius, L periscapular region and down LUE Pain Quality: burning, stinging aching  Radiating: Yes, down L upper limb Numbness/Tingling: No Focal Weakness: No Aggravating factors: lying on L side or R side, overhead activity,  Relieving factors: pillows around head/neck, Rx medication 24-hour pain behavior: None History of prior neck injury, pain, surgery, or therapy: Yes, Hx of shoulder  pain prior to farm accident - exacerbated by fall  Falls: Has patient fallen in last 6 months? Yes, Number of falls: 2 times when working on farm  Follow-up appointment with MD: No Dominant hand: left Imaging: Yes   Head/neck CT 12/27/21 IMPRESSION: 1.  No intracranial large vessel occlusion or significant stenosis. 2.  No hemodynamically significant stenosis in the neck. 3. No evidence of dissection or aneurysm in the head or neck.     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  Hobbies: Psychologist, forensicrefinishing furniture  Red flags (personal history of cancer, h/o spinal tumors, history of compression fracture, chills/fever, night sweats, nausea, vomiting, unrelenting pain): Negative             Hx of precancerous skin lesions only, nausea acutely following head trauma in September    Precautions: Fall Hx with working on farm    [employer] Restrictions: No   EconomistLiving Environment Lives with: lives with their spouse, daughter and her family [place]  Lives in: House/apartment     Patient Goals: Find out cause of left upper quarter pain, getting off of medication     PRECAUTIONS: Fall Hx 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 She reports pain in bilateral shoulders. Pt reports that she was cooking all day the day prior and had ample cooking to complete on Thursday. She reports that it did settle down after Thursday. She reports intermittent catch in her neck with movement. No pain into shoulder blade. Pt reports that her L shoulder seems to be fine.    PAIN:  Are you having pain? Yes: NPRS scale: 1-2/10 Pain location: R paracervical region and along R periscapular region     OBJECTIVE: (objective measures completed at initial evaluation unless otherwise dated)    Patient Surveys  FOTO 53, predicted score of 1666   Cognition Patient is oriented to person, place, and time.  Recent memory is intact.  Remote memory is intact.  Attention span and concentration are intact.  Expressive speech is intact.  Patient's fund of knowledge is  within normal limits for educational level.  Gross Musculoskeletal Assessment Tremor: None Bulk: Normal Tone: Normal     Posture Forward head, mild rounded shoulders posture   AROM   Shoulder Flexion 150 bilat (pain on L upper trap), Shoulder Abduction WNL bilat (pain in L deltoid with return to neutral position)   AROM (Normal range in degrees) AROM 02/21/2022  Cervical  Flexion (50) 55*  Extension (80) 30*  Right lateral flexion (45) 45  Left lateral flexion (45) 43  Right rotation (85) 75  Left rotation (85) 57*  (* = pain; Blank rows = not tested)       MMT   MMT (out of 5) Right 02/21/2022 Left 02/21/2022         Shoulder   Flexion 4 4  Extension      Abduction 4 4  Internal rotation      Horizontal abduction      Horizontal adduction      Lower Trapezius      Rhomboids             Elbow  Flexion 4+ 4+  Extension 5 4+  Pronation      Supination             Wrist  Flexion 5 5  Extension 5 5  Radial deviation      Ulnar deviation      (* = pain; Blank rows = not tested)   Sensation Grossly intact to light touch bilateral UE as determined by testing dermatomes C2-T2. Proprioception and hot/cold testing deferred on this date.   Reflexes R/L Elbow: 2+/2+  Brachioradialis: 2+/2+  Tricep: 1+/1+ Patellar Tendon 2+/2+ Achilles 2+/1+ Clonus Negative bilat    Palpation   Location LEFT  RIGHT           Suboccipitals      Cervical paraspinals 1 0  Upper Trapezius 2    Levator Scapulae 2    Rhomboid Major/Minor 2    Infraspinatus 2    (Blank rows = not tested) Graded on 0-4 scale (0 = no pain, 1 = pain, 2 = pain with wincing/grimacing/flinching, 3 = pain with withdrawal, 4 = unwilling to allow palpation), (Blank rows = not tested)     Repeated Movements Repeated cervical retraction in supine: no effect during, minimal symptoms after when lying in supine     SPECIAL TESTS Spurlings A (ipsilateral lateral  flexion/axial compression): R: Negative L: Positive Distraction Test: Positive for modest pain relief   Cervical spine compression: Negative Hoffman Sign (cervical cord compression): R: Negative L: Negative    PASSIVE ACCESSORY JOINT MOBILITY Hypomobile CPA C5-7 with moderate pain at restriction. Decreased L to R sideglide C4-7 with mild pain at restriction.        TODAY'S TREATMENT    OBJECTIVE FINDINGS  AROM Cervical flexion:  WNL Cervical extension: 50% motion loss Lateral flexion: Right WNL, Left WNL (left lateral flexion more "stiff")  Cervical rotation: Right WNL, Left WNL (tight with end-range L rotation)  *Indicates pain   Therapeutic Exercise - for improved soft tissue flexibility and extensibility as needed for ROM, cervical spine mobility as needed for neck motion/scanning environment, postural re-edu   Cervical retraction, with patient overpressure; 1x10; increase in pressure along posterior C-spine during, no change after   *next visit* Standing row with maintenance of upright posture and cerv retraction; Green Tband; 2x10, 5 sec hold Seated cervical extension SNAG; 2x10, 1 sec hold at barrier   *not today* Bruegger's; postural endurance drill with upright sitting position,  maintaining "W" position of upper extremities Levator scapulae stretch; 2x30 sec on either side Cervical retraction, with patient overpressure; 1x10; increase in pressure along posterior C-spine during, no change after Cervical retraction; 2x10; increase in pain along L arm during, no worse after     Manual Therapy - for symptom modulation, soft tissue sensitivity and mobility, joint mobility, nerve root decompression for pain reduction   Manual cervical traction with cervical flexion 45 deg; 10 sec on, 5 sec off; x 8 minutes   Cervical sideglides; L to R ; gr II for pain control x 30 sec, gr III for joint mobility 2x30 sec at each level along C5-6  STM/DTM left UT, levator scapulae,  cervical paraspinals, rhomboid major x 15 minutes   -Attempted R SNAG at C2 level with active L rotation, pt has ongoing pain with L rotation and referral into L arm; this technique was stopped due to peripheralizing symptoms   *hold today* Manual cervical traction + retraction; x 10 repetitions, 1 sec hold at end-range (no overpressure)    MHP (unbilled) utilized post-treatment for analgesic effect and improved soft tissue extensibility, along R upper trapezius and R proximal arm in sitting; x 8 minutes.    *not today* Cervical sidebend stretch; reviewed  Trigger Point Dry Needling (TDN), unbilled Education performed with patient regarding potential benefit of TDN as well as pertinent precautions and risks with patient at previous session. Pt provided verbal consent to treatment. TDN performed to left upper trapezius, left levator scapulae, left rhomboid major with 0.25 x 40 single needle placements with local twitch response (LTR). Pistoning technique utilized. Notable post-DN soreness noted today.          PATIENT EDUCATION:  Education details: Plan of care, HEP Person educated: Patient Education method: Explanation Education comprehension: verbalized understanding     HOME EXERCISE PROGRAM: Access Code: [email or web address] URL: [email or web address] Date: 02/21/2022 Prepared by: Consuela Mimes   Exercises - Seated Cervical Retraction  - 5-6 x daily - 7 x weekly - 1 sets - 10 reps - sec hold - Seated Upper Trapezius Stretch  - 2 x daily - 7 x weekly - 3 sets - 30sec hold - Seated Scapular Retraction  - 2 x daily - 7 x weekly - 2 sets - 10 reps - 3sec hold     ASSESSMENT:   CLINICAL IMPRESSION:  Patient demonstrates notably improved cervical spine AROM with only moderate motion loss into extension, otherwise WFL AROM. Pt does have discomfort and pulling/tightness with end-range left rotation and left lateral flexion. This is not improved with manual SNAG  technique, and pt does have referred L arm pain briefly with  continued cervical spine left rotation AROM. This arm pain is abated with repeated retraction with patient overpressure. DN utilized for tightness along L upper trap/levator scapulae; treated at mastoid process to treat via radiculopathic model given innervations of attaching muscles from C2-T1. Pt does have post-treatment soreness as expected following DN, but overall tolerates session well. Patient has remaining deficits in decreased cervical spine AROM, pain with shoulder elevation, dec shoulder flexion/ABD strength, postural changes, and sensitivity along L upper trap/rhomboid maj/posterior cuff mm. 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 Patient will benefit from skilled continued PT to address above impairments and improve overall function.   REHAB POTENTIAL: Good   CLINICAL DECISION MAKING: Evolving/moderate complexity   EVALUATION COMPLEXITY: Moderate     GOALS: Goals reviewed with patient? Yes   SHORT TERM GOALS: Target date: 03/14/2022  Pt will be independent with HEP to improve strength and decrease neck pain to improve pain-free function at home and work. Baseline: 02/21/22: Baseline HEP initiated Goal status: INITIAL     LONG TERM GOALS: Target date: 04/04/2022   Pt will increase FOTO to at least 66 to demonstrate significant improvement in function at home and work related to neck pain  Baseline: 02/21/22: 53 Goal status: INITIAL   2.  Pt will decrease worst neck pain by at least 2 points on the NPRS in order to demonstrate clinically significant reduction in neck pain. Baseline: 02/21/22: 8/10 at worst Goal status: INITIAL   3.  Pt will have full cervical spine AROM without reproduction of symptoms as needed for scanning environment and performing overhead activity as needed for working on her farm and driving to access community    Baseline: 02/21/22: Motion loss with  extension and L rotation, pain with flexion/extension/L rotation.   Goal status: INITIAL   4.  Pt will perform wall alphabet with hand fixed on wall without reproduction of pain indicative of improved ability to perform repetitive forward reaching activity as needed for refurbishing antique furniture  Baseline: 02/21/22: Pain with forward shoulder flexion and repetitive L upper limb movement.    Goal status: INITIAL     PLAN: PT FREQUENCY: 2x/week   PT DURATION: 6 weeks   PLANNED INTERVENTIONS: Therapeutic exercises, Therapeutic activity, Neuromuscular re-education,  Patient/Family education, Joint mobilization, Dry Needling, Electrical stimulation, Spinal mobilization, Cryotherapy, Moist heat, Traction, and Manual therapy   PLAN FOR NEXT SESSION: F/u on response with HEP/repeated retraction, continue with traction and manual therapy for symptom modulation. Progress with extension principle management as needed pending symptomatic and ROM response. Exercise and manual techniques for cervical spine mobility and postural re-edu.     Consuela Mimes, PT, DPT #[id number]  Gertie Exon, PT 03/21/2022, 2:48 PM

## 2022-03-23 ENCOUNTER — Ambulatory Visit: Payer: Medicare HMO | Admitting: Physical Therapy

## 2022-03-23 ENCOUNTER — Encounter: Payer: Self-pay | Admitting: Physical Therapy

## 2022-03-23 DIAGNOSIS — M6281 Muscle weakness (generalized): Secondary | ICD-10-CM

## 2022-03-23 DIAGNOSIS — M542 Cervicalgia: Secondary | ICD-10-CM

## 2022-03-23 DIAGNOSIS — M25512 Pain in left shoulder: Secondary | ICD-10-CM | POA: Diagnosis not present

## 2022-03-23 NOTE — Therapy (Addendum)
OUTPATIENT PHYSICAL THERAPY TREATMENT NOTE   Patient Name: Lauren Leblanc MRN: [id number] DOB:[birth year], 71 y.o., female Today's Date: 02/23/2022  PCP: Duanne Limerick, MD REFERRING PROVIDER: Duanne Limerick, MD  END OF SESSION:   PT End of Session - 03/23/22 1343     Visit Number 9    Number of Visits 13    Date for PT Re-Evaluation 04/04/22    Authorization Type [employer] 2023    Authorization Time Period VL based on medical necessity    Progress Note Due on Visit 10    PT Start Time 1344    PT Stop Time 1427    PT Time Calculation (min) 43 min    Activity Tolerance Patient tolerated treatment well    Behavior During Therapy WFL for tasks assessed/performed                   Past Medical History:  Diagnosis Date   Angina at rest    Arthritis    GERD (gastroesophageal reflux disease)    Hypertension    Past Surgical History:  Procedure Laterality Date   BACK SURGERY  2014   CYST REMOVAL   BREAST BIOPSY Bilateral    neg   DILATION AND CURETTAGE OF UTERUS     TOTAL KNEE ARTHROPLASTY Left 04/13/2020   Procedure: TOTAL KNEE ARTHROPLASTY;  Surgeon: Christena Flake, MD;  Location: [facility] ORS;  Service: Orthopedics;  Laterality: Left;   TUBAL LIGATION     Patient Active Problem List   Diagnosis Date Noted   Statin intolerance 02/06/2022   Spondylolisthesis of lumbar region 06/18/2020   Sacroiliitis (HCC) 06/18/2020   Status post total knee replacement using cement, left 04/13/2020   Esophageal dysphagia 07/04/2019   Familial hypercholesterolemia 07/04/2019   Gastroesophageal reflux disease 07/04/2019   Obesity (BMI 30.0-34.9) 03/13/2018   Primary osteoarthritis of left knee 03/13/2018   Hyperlipidemia, mixed 04/26/2017   Chest pain 04/24/2017   Essential hypertension 01/06/2016    REFERRING DIAG: M54.13 (ICD-10-CM) - Radiculopathy of cervicothoracic region   THERAPY DIAG:  Cervicalgia  Left shoulder pain, unspecified chronicity  Muscle  weakness (generalized)  Rationale for Evaluation and Treatment Rehabilitation  PERTINENT HISTORY: Patient is a 71 year old female with primary complaint of L-sided neck/shoulder pain. Pt reports she will be 2 years s/p TKA this December. She reports injuring her L shoulder when trying to offload LLE. 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 Pt reports pain emanating from L periscapular region and down her arm to her elbow. She reports aching, burning, stinging into her L upper limb. Pt reports taking half dose of muscle relaxer due to sensitivity to medication. Pt denies current visual changes, photosensitivity or sound sensitivity. She did have headache after her head injury, but this it not primary concern at this time. 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    Pain:  Pain Intensity: Present: 2/10, Best: 2/10 (pain mitigated with tizanidine/diclofenac), Worst: 8/10 Pain location: L upper trapezius, L periscapular region and down LUE Pain Quality: burning, stinging aching  Radiating: Yes, down L upper limb Numbness/Tingling: No Focal Weakness: No Aggravating factors: lying on L side or R side, overhead activity,  Relieving factors: pillows around head/neck, Rx medication 24-hour pain behavior: None History of prior neck injury, pain, surgery, or therapy: Yes, Hx of  shoulder pain prior to farm accident - exacerbated by fall  Falls: Has patient fallen in last 6 months? Yes, Number of falls: 2 times when working on farm  Follow-up appointment with MD: No Dominant hand: left Imaging: Yes   Head/neck CT 12/27/21 IMPRESSION: 1.  No intracranial large vessel occlusion or significant stenosis. 2.  No hemodynamically significant stenosis in the neck. 3. No evidence of dissection or aneurysm in the head or neck.     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, music flags (personal history of cancer, h/o spinal tumors, history of compression fracture, chills/fever, night sweats, nausea, vomiting, unrelenting pain): Negative             Hx of precancerous skin lesions only, nausea acutely following head trauma in September    Precautions: Fall Hx with working on farm    [employer] Restrictions: No   Financial controller Lives with: lives with their spouse, daughter and her family live nearby in [place]  Lives in: House/apartment     Patient Goals: Find out cause of left upper quarter pain, getting off of medication     PRECAUTIONS: Fall Hx 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 Pt reports some pain in bilateral shoulders last night that disturbed her sleep with dull, aching sensation. Patient reports feeling okay this afternoon. She denies neck/L arm pain at arrival to PT; no periscapular symptoms.   PAIN:  Are you having pain? No Pain location: R paracervical region and along R periscapular region     OBJECTIVE: (objective measures completed at initial evaluation unless otherwise dated)    Patient Surveys  FOTO 53, predicted score of 71   Cognition Patient is oriented to person, place, and time.  Recent memory is intact.  Remote memory is intact.  Attention span and concentration are intact.  Expressive speech is intact.  Patient's fund of knowledge is within normal limits for educational level.                          Gross Musculoskeletal Assessment Tremor: None Bulk: Normal Tone: Normal      Posture Forward head, mild rounded shoulders posture   AROM   Shoulder Flexion  150 bilat (pain on L upper trap), Shoulder Abduction WNL bilat (pain in L deltoid with return to neutral position)   AROM (Normal range in degrees) AROM 02/21/2022  Cervical  Flexion (50) 55*  Extension (80) 30*  Right lateral flexion (45) 45  Left lateral flexion (45) 43  Right rotation (85) 75  Left rotation (85) 57*  (* = pain; Blank rows = not tested)       MMT   MMT (out of 5) Right 02/21/2022 Left 02/21/2022         Shoulder   Flexion 4 4  Extension      Abduction 4 4  Internal rotation      Horizontal abduction      Horizontal adduction      Lower Trapezius      Rhomboids             Elbow  Flexion 4+ 4+  Extension 5 4+  Pronation      Supination             Wrist  Flexion 5 5  Extension 5 5  Radial deviation      Ulnar deviation      (* = pain; Blank rows = not tested)   Sensation Grossly intact to light touch bilateral UE as determined by testing dermatomes C2-T2. Proprioception and hot/cold testing deferred on this date.   Reflexes R/L Elbow: 2+/2+  Brachioradialis: 2+/2+  Tricep: 1+/1+ Patellar Tendon 2+/2+ Achilles 2+/1+ Clonus Negative bilat    Palpation   Location LEFT  RIGHT           Suboccipitals      Cervical paraspinals 1 0  Upper Trapezius 2    Levator Scapulae 2    Rhomboid Major/Minor 2    Infraspinatus 2    (Blank rows = not tested) Graded on 0-4 scale (0 = no pain, 1 = pain, 2 = pain with wincing/grimacing/flinching, 3 = pain with withdrawal, 4 = unwilling to allow palpation), (Blank rows = not tested)     Repeated Movements Repeated cervical retraction in supine: no effect during, minimal symptoms after when lying in supine     SPECIAL TESTS Spurlings A (ipsilateral lateral flexion/axial compression): R: Negative L: Positive Distraction Test: Positive for modest pain relief   Cervical spine compression: Negative Hoffman Sign  (cervical cord compression): R: Negative L: Negative    PASSIVE ACCESSORY JOINT MOBILITY Hypomobile CPA C5-7 with moderate pain at restriction. Decreased L to R sideglide C4-7 with mild pain at restriction.        TODAY'S TREATMENT    JAMAR grip strength (best of 3 trials) R 47.2 lbs L 55.0 lbs   OBJECTIVE FINDINGS  AROM Cervical flexion:  WNL Cervical extension: 50% motion loss Lateral flexion: Right WNL, Left WNL (left lateral flexion more "stiff")  Cervical rotation: Right WNL, Left WNL (tight with end-range L rotation)  *Indicates pain   Therapeutic Exercise - for improved soft tissue flexibility and extensibility as needed for ROM, cervical spine mobility as needed for neck motion/scanning environment, postural re-edu  Cervical retraction, with patient overpressure; 1x10; increase in pressure along posterior C-spine during, no notable pain reported after Standing row with maintenance of upright posture and cerv retraction; BLUE Tband; 2x10, 5 sec hold Bruegger's; yellow Tband postural endurance drill with upright sitting position, maintaining "W" position of upper extremities; x10, 10 sec hold  *Checked AROM and JAMAR grip strength today   PATIENT EDUCATION: discussed current progress and clinical  presentation. Discussed transition to maintenance phase in upcoming weeks pending no significant regression in patient's condition and ability to manage pain.     *not today* Seated cervical extension SNAG; 2x10, 1 sec hold at barrier Levator scapulae stretch; 2x30 sec on either side Cervical retraction, with patient overpressure; 1x10; increase in pressure along posterior C-spine during, no change after Cervical retraction; 2x10; increase in pain along L arm during, no worse after     Manual Therapy - for symptom modulation, soft tissue sensitivity and mobility, joint mobility, nerve root decompression for pain reduction    Cervical sideglides; L to R ; gr II for pain  control x 30 sec, gr III for joint mobility 2x30 sec at each level along C5-6  STM/DTM left UT, levator scapulae, cervical paraspinals, rhomboid major x 10 minutes   Manual cervical traction with cervical flexion 45 deg; 10 sec on, 5 sec off; x 5 minutes   *hold today* Attempted R SNAG at C2 level with active L rotation, pt has ongoing pain with L rotation and referral into L arm; this technique was stopped due to peripheralizing symptoms Manual cervical traction + retraction; x 10 repetitions, 1 sec hold at end-range (no overpressure)     *not today* Cervical sidebend stretch; reviewed  Trigger Point Dry Needling (TDN), unbilled Education performed with patient regarding potential benefit of TDN as well as pertinent precautions and risks with patient at previous session. Pt provided verbal consent to treatment. TDN performed to left upper trapezius, left levator scapulae, left rhomboid major with 0.25 x 40 single needle placements with local twitch response (LTR). Pistoning technique utilized. Notable post-DN soreness noted today.          PATIENT EDUCATION:  Education details: Plan of care, HEP Person educated: Patient Education method: Explanation Education comprehension: verbalized understanding     HOME EXERCISE PROGRAM: Access Code: [email or web address] URL: [email or web address] Date: 02/21/2022 Prepared by: Valentina Gu   Exercises - Seated Cervical Retraction  - 5-6 x daily - 7 x weekly - 1 sets - 10 reps - sec hold - Seated Upper Trapezius Stretch  - 2 x daily - 7 x weekly - 3 sets - 30sec hold - Seated Scapular Retraction  - 2 x daily - 7 x weekly - 2 sets - 10 reps - 3sec hold     ASSESSMENT:   CLINICAL IMPRESSION:  Patient did have notable soreness after last session that lasted into the following day. She has minimal symptoms at arrival and demonstrates markedly improved cervical spine AROM. She does attest to difficulty with carrying/holding items  at times. Assessed JAMAR grip strength for potential decreased motor output of LUE secondary to radiculopathy. Pt has excellent L grip strength comparable to normative values for age and sex. Pt does have L arm pain following trials of JAMAR gripping that is improved with use of traction and period of rest. Pt has markedly improved pain and mobility, but she has ongoing irritability of L upper quarter pain when performing work with L arm. Patient has remaining deficits in decreased cervical spine AROM, pain with shoulder elevation, dec shoulder flexion/ABD strength, postural changes, and sensitivity along L upper trap/rhomboid maj/posterior cuff mm. 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 Patient will benefit from skilled continued PT to address above impairments and improve overall function.   REHAB POTENTIAL: Good   CLINICAL DECISION MAKING: Evolving/moderate complexity   EVALUATION COMPLEXITY: Moderate     GOALS: Goals reviewed with patient? Yes  SHORT TERM GOALS: Target date: 03/14/2022   Pt will be independent with HEP to improve strength and decrease neck pain to improve pain-free function at home and work. Baseline: 02/21/22: Baseline HEP initiated Goal status: INITIAL     LONG TERM GOALS: Target date: 04/04/2022   Pt will increase FOTO to at least 66 to demonstrate significant improvement in function at home and work related to neck pain  Baseline: 02/21/22: 53 Goal status: INITIAL   2.  Pt will decrease worst neck pain by at least 2 points on the NPRS in order to demonstrate clinically significant reduction in neck pain. Baseline: 02/21/22: 8/10 at worst Goal status: INITIAL   3.  Pt will have full cervical spine AROM without reproduction of symptoms as needed for scanning environment and performing overhead activity as needed for working on her farm and driving to access community    Baseline: 02/21/22: Motion loss with extension and L  rotation, pain with flexion/extension/L rotation.   Goal status: INITIAL   4.  Pt will perform wall alphabet with hand fixed on wall without reproduction of pain indicative of improved ability to perform repetitive forward reaching activity as needed for refurbishing antique furniture  Baseline: 02/21/22: Pain with forward shoulder flexion and repetitive L upper limb movement.    Goal status: INITIAL     PLAN: PT FREQUENCY: 2x/week   PT DURATION: 6 weeks   PLANNED INTERVENTIONS: Therapeutic exercises, Therapeutic activity, Neuromuscular re-education,  Patient/Family education, Joint mobilization, Dry Needling, Electrical stimulation, Spinal mobilization, Cryotherapy, Moist heat, Traction, and Manual therapy   PLAN FOR NEXT SESSION: F/u on response with HEP/repeated retraction, continue with traction and manual therapy for symptom modulation. Progress with extension principle management as needed pending symptomatic and ROM response. Exercise and manual techniques for cervical spine mobility and postural re-edu.     Valentina Gu, PT, DPT UK:[id number]  Eilleen Kempf, PT 03/23/2022, 1:43 PM

## 2022-03-28 ENCOUNTER — Ambulatory Visit: Payer: Medicare HMO | Attending: Family Medicine | Admitting: Physical Therapy

## 2022-03-28 ENCOUNTER — Encounter: Payer: Self-pay | Admitting: Physical Therapy

## 2022-03-28 DIAGNOSIS — M542 Cervicalgia: Secondary | ICD-10-CM | POA: Diagnosis not present

## 2022-03-28 DIAGNOSIS — M6281 Muscle weakness (generalized): Secondary | ICD-10-CM | POA: Diagnosis not present

## 2022-03-28 DIAGNOSIS — M25512 Pain in left shoulder: Secondary | ICD-10-CM | POA: Insufficient documentation

## 2022-03-28 NOTE — Therapy (Signed)
OUTPATIENT PHYSICAL THERAPY TREATMENT AND PROGRESS NOTE   Dates of reporting period  02/21/22   to   03/28/22    Patient Name: Lauren Leblanc MRN: [id number] DOB:05-26-1950, 71 y.o., female Today's Date: 02/23/2022  PCP: Juline Patch, MD REFERRING PROVIDER: Juline Patch, MD  END OF SESSION:   PT End of Session - 03/28/22 1431     Visit Number 10    Number of Visits 13    Date for PT Re-Evaluation 04/04/22    Authorization Type [employer] 2023    Authorization Time Period VL based on medical necessity    Progress Note Due on Visit 10    PT Start Time 1351    PT Stop Time 1433    PT Time Calculation (min) 42 min    Activity Tolerance Patient tolerated treatment well    Behavior During Therapy WFL for tasks assessed/performed              Past Medical History:  Diagnosis Date   Angina at rest    Arthritis    GERD (gastroesophageal reflux disease)    Hypertension    Past Surgical History:  Procedure Laterality Date   BACK SURGERY  2014   CYST REMOVAL   BREAST BIOPSY Bilateral    neg   DILATION AND CURETTAGE OF UTERUS     TOTAL KNEE ARTHROPLASTY Left 04/13/2020   Procedure: TOTAL KNEE ARTHROPLASTY;  Surgeon: Corky Mull, MD;  Location: [facility] OR[facility];  Service: Orthopedics;  Laterality: Left;   TUBAL LIGATION     Patient Active Problem List   Diagnosis Date Noted   Statin intolerance 02/06/2022   Spondylolisthesis of lumbar region 06/18/2020   Sacroiliitis ([place]) 06/18/2020   Status post total knee replacement using cement, left 04/13/2020   Esophageal dysphagia 07/04/2019   Familial hypercholesterolemia 07/04/2019   Gastroesophageal reflux disease 07/04/2019   Obesity (BMI 30.0-34.9) 03/13/2018   Primary osteoarthritis of left knee 03/13/2018   Hyperlipidemia, mixed 04/26/2017   Chest pain 04/24/2017   Essential hypertension 01/06/2016    REFERRING DIAG: M54.13 (ICD-10-CM) - Radiculopathy of cervicothoracic region   THERAPY DIAG:   Cervicalgia  Left shoulder pain, unspecified chronicity  Muscle weakness (generalized)  Rationale for Evaluation and Treatment Rehabilitation  PERTINENT HISTORY: Patient is a 71 year old female with primary complaint of L-sided neck/shoulder pain. Pt reports she will be 2 years s/p TKA this December. She reports injuring her L shoulder when trying to offload LLE. 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 Pt reports pain emanating from L periscapular region and down her arm to her elbow. She reports aching, burning, stinging into her L upper limb. Pt reports taking half dose of muscle relaxer due to sensitivity to medication. Pt denies current visual changes, photosensitivity or sound sensitivity. She did have headache after her head injury, but this it not primary concern at this time. 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    Pain:  Pain Intensity: Present: 2/10, Best: 2/10 (pain mitigated with tizanidine/diclofenac), Worst: 8/10 Pain location: L upper trapezius, L periscapular region and down LUE Pain Quality: burning, stinging aching  Radiating: Yes, down L upper limb Numbness/Tingling: No Focal Weakness: No Aggravating factors: lying on L side or R side, overhead activity,  Relieving factors: pillows around head/neck, Rx medication 24-hour pain behavior: None  History of prior neck injury, pain, surgery, or therapy: Yes, Hx of shoulder pain prior to farm accident - exacerbated by fall  Falls: Has patient fallen in last 6 months? Yes, Number of falls: 2 times when working on farm  Follow-up appointment with MD: No Dominant hand: left Imaging: Yes   Head/neck CT 12/27/21 IMPRESSION: 1.  No intracranial large vessel occlusion or significant stenosis. 2.  No hemodynamically significant stenosis in the neck. 3. No evidence of dissection  or aneurysm in the head or neck.     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, music flags (personal history of cancer, h/o spinal tumors, history of compression fracture, chills/fever, night sweats, nausea, vomiting, unrelenting pain): Negative             Hx of precancerous skin lesions only, nausea acutely following head trauma in September    Precautions: Fall Hx with working on farm    [employer] Restrictions: No   Financial controller Lives with: lives with their spouse, daughter and her family live nearby in [place]  Lives in: House/apartment     Patient Goals: Find out cause of left upper quarter pain, getting off of medication     PRECAUTIONS: Fall Hx 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 Patient reports having usual AM stiffness that improves with warm shower. Patient reports she tolerates motion well unless she forces L cervical spine rotation. 90% SANE score.    PAIN:  Are you having pain? Yes, 2/10 pain at arrival Pain location: R paracervical region and along R periscapular region     OBJECTIVE: (objective measures completed at initial evaluation unless otherwise dated)    Patient Surveys  FOTO 53, predicted score of 67   Cognition Patient is oriented to person, place, and time.  Recent memory is intact.  Remote memory is intact.  Attention span and concentration are intact.  Expressive speech is intact.  Patient's fund of knowledge is within normal limits for educational level.                          Gross Musculoskeletal Assessment Tremor: None Bulk:  Normal Tone: Normal     Posture Forward head, mild rounded shoulders posture   AROM  Shoulder Flexion 150 bilat (pain on L upper trap), Shoulder Abduction WNL bilat (pain in L deltoid with return to neutral position) 03/28/22:  Shoulder Flexion 150 bilat, Shoulder Abduction WNL bilat    AROM (Normal range in degrees) AROM 02/21/2022 AROM 03/28/22  Cervical   Flexion (50) 55* 56* (little bit sore L paracervical)  Extension (80) 30* 35*  Right lateral flexion (45) 45 43*  Left lateral flexion (45) 43 41*  Right rotation (85) 75 72  Left rotation (85) 57* 73*  (* = pain; Blank rows = not tested)  *Soreness in L cervical paraspinal in all directions with exception of R rotation   MMT   MMT (out of 5) Right 02/21/2022 Left 02/21/2022 Right 03/28/22 Left 03/28/22           Shoulder     Flexion 4 4 4+ 4+  Extension        Abduction 4 4 4+ 4+  Internal rotation        Horizontal abduction        Horizontal adduction        Lower Trapezius        Rhomboids                 Elbow    Flexion 4+ 4+ 5 5  Extension 5 4+ 5 5  Pronation        Supination                 Wrist    Flexion 5 5    Extension 5 5    Radial deviation        Ulnar deviation        (* = pain; Blank rows = not tested)    JAMAR grip strength (best of 3 trials) 03/23/22 R 47.2 lbs L 55.0 lbs   Sensation Grossly intact to light touch bilateral UE as determined by testing dermatomes C2-T2. Proprioception and hot/cold testing deferred on this date.   Reflexes R/L Elbow: 2+/2+  Brachioradialis: 2+/2+  Tricep: 1+/1+ Patellar Tendon 2+/2+ Achilles 2+/1+ Clonus Negative bilat    Palpation   Location LEFT  RIGHT           Suboccipitals      Cervical paraspinals 1 0  Upper Trapezius 2    Levator Scapulae 2    Rhomboid Major/Minor 2    Infraspinatus 2    (Blank rows = not tested) Graded on 0-4 scale (0 = no pain, 1 = pain, 2 = pain with wincing/grimacing/flinching, 3 = pain with withdrawal,  4 = unwilling to allow palpation), (Blank rows = not tested)     Repeated Movements Repeated cervical retraction in supine: no effect during, minimal symptoms after when lying in supine     SPECIAL TESTS Spurlings A (ipsilateral lateral flexion/axial compression): R: Negative L: Positive Distraction Test: Positive for modest pain relief   Cervical spine compression: Negative Hoffman Sign (cervical cord compression): R: Negative L: Negative    PASSIVE ACCESSORY JOINT MOBILITY Hypomobile CPA C5-7 with moderate pain at restriction. Decreased L to R sideglide C4-7 with mild pain at restriction.        TODAY'S TREATMENT     Therapeutic Exercise - for improved soft tissue flexibility and extensibility as needed for ROM, cervical spine mobility as needed for neck motion/scanning environment, postural re-edu   GOAL UPDATE PERFORMED*  Standing row with maintenance of upright posture and cerv retraction; BLUE Tband; 2x12, 3 sec hold Wall ABCs with  towel, wall wash technique; performed A-Z with upright posture and arm starting at night 90 shldr flexion    PATIENT EDUCATION: discussed current progress in PT and expected course of care with remaining visits. Discussed maintenance plan and prognosis.    *not today* Cervical retraction, with patient overpressure; 1x10; increase in pressure along posterior C-spine during, no notable pain reported after Bruegger's; yellow Tband postural endurance drill with upright sitting position, maintaining "W" position of upper extremities; x10, 10 sec hold Seated cervical extension SNAG; 2x10, 1 sec hold at barrier Levator scapulae stretch; 2x30 sec on either side Cervical retraction, with patient overpressure; 1x10; increase in pressure along posterior C-spine during, no change after Cervical retraction; 2x10; increase in pain along L arm during, no worse after     Manual Therapy - for symptom modulation, soft tissue sensitivity and mobility,  joint mobility, nerve root decompression for pain reduction    STM/DTM left UT, levator scapulae, left splenius cervicis/capitis C3-6, left scalenes  x 15 minutes   Manual cervical traction with cervical flexion 45 deg; 10 sec on, 5 sec off; x 5 minutes   *hold today* STM/DTM rhomboid major Cervical sideglides; L to R ; gr II for pain control x 30 sec, gr III for joint mobility 2x30 sec at each level along C5-6 Attempted R SNAG at C2 level with active L rotation, pt has ongoing pain with L rotation and referral into L arm; this technique was stopped due to peripheralizing symptoms Manual cervical traction + retraction; x 10 repetitions, 1 sec hold at end-range (no overpressure)     *not today* Cervical sidebend stretch; reviewed  Trigger Point Dry Needling (TDN), unbilled Education performed with patient regarding potential benefit of TDN as well as pertinent precautions and risks with patient at previous session. Pt provided verbal consent to treatment. TDN performed to left upper trapezius, left levator scapulae, left rhomboid major with 0.25 x 40 single needle placements with local twitch response (LTR). Pistoning technique utilized. Notable post-DN soreness noted today.          PATIENT EDUCATION:  Education details: Plan of care, HEP Person educated: Patient Education method: Explanation Education comprehension: verbalized understanding     HOME EXERCISE PROGRAM: Access Code: [email or web address] URL: [email or web address] Date: 02/21/2022 Prepared by: Valentina Gu   Exercises - Seated Cervical Retraction  - 5-6 x daily - 7 x weekly - 1 sets - 10 reps - sec hold - Seated Upper Trapezius Stretch  - 2 x daily - 7 x weekly - 3 sets - 30sec hold - Seated Scapular Retraction  - 2 x daily - 7 x weekly - 2 sets - 10 reps - 3sec hold     ASSESSMENT:   CLINICAL IMPRESSION:  Patient has made good progress to date with significantly improved pain rating and pt having  met her FOTO score. 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 She is still limited with closing on L side primarily with intermittent L upper quarter/arm pain with this. Pt demonstrates improved cervical spine AROM, though deficits remain and pt does have L-sided discomfort with end-range in most directions. #########. Patient has remaining deficits in decreased cervical spine AROM, pain with shoulder elevation, dec shoulder flexion/ABD strength, postural changes, and sensitivity along L upper trap/rhomboid maj/posterior cuff mm. 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 Patient will benefit from skilled continued PT to address above impairments and improve overall  function.   REHAB POTENTIAL: Good   CLINICAL DECISION MAKING: Evolving/moderate complexity   EVALUATION COMPLEXITY: Moderate     GOALS: Goals reviewed with patient? Yes   SHORT TERM GOALS: Target date: 03/14/2022   Pt will be independent with HEP to improve strength and decrease neck pain to improve pain-free function at home and work. Baseline: 02/21/22: Baseline HEP initiated.   03/28/22: Pt has good compliance and sound understanding of HEP as well as exercise techniques.  Goal status: ACHIEVED     LONG TERM GOALS: Target date: 04/04/2022   Pt will increase FOTO to at least 66 to demonstrate significant improvement in function at home and work related to neck pain  Baseline: 02/21/22: 53.   03/28/22: 69 Goal status: ACHIEVED   2.  Pt will decrease worst neck pain by at least 2 points on the NPRS in order to demonstrate clinically significant reduction in neck pain. Baseline: 02/21/22: 8/10 at worst   03/28/22: no more than 2-3/10 over last week  Goal status: ACHIEVED   3.  Pt will have full cervical spine AROM without reproduction of symptoms as needed for scanning environment and performing overhead activity as  needed for working on her farm and driving to access community    Baseline: 02/21/22: Motion loss with extension and L rotation, pain with flexion/extension/L rotation.    03/28/22: Motion loss with extension, minimal end-range rotation deficit bilaterally (rotation is [facility]); L paraspinal mild "soreness" in all directions but R C-spine rotation. Goal status: IN PROGRESS   4.  Pt will perform wall alphabet with hand fixed on wall without reproduction of pain indicative of improved ability to perform repetitive forward reaching activity as needed for refurbishing antique furniture  Baseline: 02/21/22: Pain with forward shoulder flexion and repetitive L upper limb movement.    03/28/22: Performed today with mild discomfort along L ACJ/upper trap near distal insertion.  Goal status: PARTIALLY MET/IN PROGRESS     PLAN: PT FREQUENCY: 2x/week   PT DURATION: 2-3 weeks   PLANNED INTERVENTIONS: Therapeutic exercises, Therapeutic activity, Neuromuscular re-education,  Patient/Family education, Joint mobilization, Dry Needling, Electrical stimulation, Spinal mobilization, Cryotherapy, Moist heat, Traction, and Manual therapy   PLAN FOR NEXT SESSION: continuing with extension principle management, manual therapy and use of SNAGs for cervical spine mobility, postural re-education. Will progress toward independent HEP over next 2 weeks pending no regression in pt condtion.      Valentina Gu, PT, DPT #[id number]  Eilleen Kempf, PT 03/28/2022, 2:32 PM

## 2022-03-30 ENCOUNTER — Ambulatory Visit: Payer: Medicare HMO | Admitting: Physical Therapy

## 2022-03-30 DIAGNOSIS — M6281 Muscle weakness (generalized): Secondary | ICD-10-CM | POA: Diagnosis not present

## 2022-03-30 DIAGNOSIS — M25512 Pain in left shoulder: Secondary | ICD-10-CM | POA: Diagnosis not present

## 2022-03-30 DIAGNOSIS — M542 Cervicalgia: Secondary | ICD-10-CM

## 2022-03-30 NOTE — Therapy (Signed)
OUTPATIENT PHYSICAL THERAPY TREATMENT   Patient Name: Lauren Leblanc MRN: [id number] DOB:13-Aug-1950, 71 y.o., female Today's Date: 02/23/2022  PCP: Juline Patch, MD REFERRING PROVIDER: Juline Patch, MD  END OF SESSION:   PT End of Session - 03/30/22 1351     Visit Number 11    Number of Visits 13    Date for PT Re-Evaluation 04/04/22    Authorization Type [employer] 2023    Authorization Time Period VL based on medical necessity    Progress Note Due on Visit 10    PT Start Time 1351    PT Stop Time 1431    PT Time Calculation (min) 40 min    Activity Tolerance Patient tolerated treatment well    Behavior During Therapy WFL for tasks assessed/performed               Past Medical History:  Diagnosis Date   Angina at rest    Arthritis    GERD (gastroesophageal reflux disease)    Hypertension    Past Surgical History:  Procedure Laterality Date   BACK SURGERY  2014   CYST REMOVAL   BREAST BIOPSY Bilateral    neg   DILATION AND CURETTAGE OF UTERUS     TOTAL KNEE ARTHROPLASTY Left 04/13/2020   Procedure: TOTAL KNEE ARTHROPLASTY;  Surgeon: Corky Mull, MD;  Location: [facility] ORS;  Service: Orthopedics;  Laterality: Left;   TUBAL LIGATION     Patient Active Problem List   Diagnosis Date Noted   Statin intolerance 02/06/2022   Spondylolisthesis of lumbar region 06/18/2020   Sacroiliitis ([place]) 06/18/2020   Status post total knee replacement using cement, left 04/13/2020   Esophageal dysphagia 07/04/2019   Familial hypercholesterolemia 07/04/2019   Gastroesophageal reflux disease 07/04/2019   Obesity (BMI 30.0-34.9) 03/13/2018   Primary osteoarthritis of left knee 03/13/2018   Hyperlipidemia, mixed 04/26/2017   Chest pain 04/24/2017   Essential hypertension 01/06/2016    REFERRING DIAG: M54.13 (ICD-10-CM) - Radiculopathy of cervicothoracic region   THERAPY DIAG:  Cervicalgia  Left shoulder pain, unspecified chronicity  Muscle weakness  (generalized)  Rationale for Evaluation and Treatment Rehabilitation  PERTINENT HISTORY: Patient is a 72 year old female with primary complaint of L-sided neck/shoulder pain. Pt reports she will be 2 years s/p TKA this December. She reports injuring her L shoulder when trying to offload LLE. 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 Pt reports pain emanating from L periscapular region and down her arm to her elbow. She reports aching, burning, stinging into her L upper limb. Pt reports taking half dose of muscle relaxer due to sensitivity to medication. Pt denies current visual changes, photosensitivity or sound sensitivity. She did have headache after her head injury, but this it not primary concern at this time. 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    Pain:  Pain Intensity: Present: 2/10, Best: 2/10 (pain mitigated with tizanidine/diclofenac), Worst: 8/10 Pain location: L upper trapezius, L periscapular region and down LUE Pain Quality: burning, stinging aching  Radiating: Yes, down L upper limb Numbness/Tingling: No Focal Weakness: No Aggravating factors: lying on L side or R side, overhead activity,  Relieving factors: pillows around head/neck, Rx medication 24-hour pain behavior: None History of prior neck injury, pain, surgery, or therapy: Yes, Hx of shoulder pain prior to farm  accident - exacerbated by fall  Falls: Has patient fallen in last 6 months? Yes, Number of falls: 2 times when working on farm  Follow-up appointment with MD: No Dominant hand: left Imaging: Yes   Head/neck CT 12/27/21 IMPRESSION: 1.  No intracranial large vessel occlusion or significant stenosis. 2.  No hemodynamically significant stenosis in the neck. 3. No evidence of dissection or aneurysm in the head or neck.     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, music flags (personal history of cancer, h/o spinal tumors, history of compression fracture, chills/fever, night sweats, nausea, vomiting, unrelenting pain): Negative             Hx of precancerous skin lesions only, nausea acutely following head trauma in September    Precautions: Fall Hx with working on farm    [employer] Restrictions: No   Financial controller Lives with: lives with their spouse, daughter and her family live nearby in [place]  Lives in: House/apartment     Patient Goals: Find out cause of left upper quarter pain, getting off of medication     PRECAUTIONS: Fall Hx 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 Pt denies minimal L arm pain. Patient reports compliance with HEP. Patient reports no new complaints at arrival.    PAIN:  Are you having pain? Yes, 1/10 pain at arrival Pain location: L paracervical region and along L periscapular region     OBJECTIVE: (objective measures completed at initial evaluation unless otherwise dated)    Patient Surveys  FOTO 53, predicted score of 62   Cognition Patient is oriented to person, place, and time.  Recent memory is intact.  Remote memory is intact.  Attention span and concentration are intact.  Expressive speech is intact.  Patient's fund of knowledge is within normal limits for educational level.                          Gross Musculoskeletal Assessment Tremor: None Bulk: Normal Tone: Normal     Posture Forward head, mild rounded shoulders posture   AROM   Shoulder Flexion 150 bilat (pain on L upper trap), Shoulder  Abduction WNL bilat (pain in L deltoid with return to neutral position) 03/28/22:  Shoulder Flexion  150 bilat, Shoulder Abduction WNL bilat    AROM (Normal range in degrees) AROM 02/21/2022 AROM 03/28/22  Cervical   Flexion (50) 55* 56* (little bit sore L paracervical)  Extension (80) 30* 35*  Right lateral flexion (45) 45 43*  Left lateral flexion (45) 43 41*  Right rotation (85) 75 72  Left rotation (85) 57* 73*  (* = pain; Blank rows = not tested)  *Soreness in L cervical paraspinal in all directions with exception of R rotation   MMT   MMT (out of 5) Right 02/21/2022 Left 02/21/2022 Right 03/28/22 Left 03/28/22           Shoulder     Flexion 4 4 4+ 4+  Extension        Abduction 4 4 4+ 4+  Internal rotation        Horizontal abduction        Horizontal adduction        Lower Trapezius        Rhomboids                 Elbow    Flexion 4+ 4+ 5 5  Extension 5 4+ 5 5  Pronation        Supination                 Wrist    Flexion 5 5    Extension 5 5    Radial deviation        Ulnar deviation        (* = pain; Blank rows = not tested)    JAMAR grip strength (best of 3 trials) 03/23/22 R 47.2 lbs L 55.0 lbs   Sensation Grossly intact to light touch bilateral UE as determined by testing dermatomes C2-T2. Proprioception and hot/cold testing deferred on this date.   Reflexes R/L Elbow: 2+/2+  Brachioradialis: 2+/2+  Tricep: 1+/1+ Patellar Tendon 2+/2+ Achilles 2+/1+ Clonus Negative bilat    Palpation   Location LEFT  RIGHT           Suboccipitals      Cervical paraspinals 1 0  Upper Trapezius 2    Levator Scapulae 2    Rhomboid Major/Minor 2    Infraspinatus 2    (Blank rows = not tested) Graded on 0-4 scale (0 = no pain, 1 = pain, 2 = pain with wincing/grimacing/flinching, 3 = pain with withdrawal, 4 = unwilling to allow palpation), (Blank rows = not tested)     Repeated Movements Repeated cervical retraction in supine: no effect during, minimal  symptoms after when lying in supine     SPECIAL TESTS Spurlings A (ipsilateral lateral flexion/axial compression): R: Negative L: Positive Distraction Test: Positive for modest pain relief   Cervical spine compression: Negative Hoffman Sign (cervical cord compression): R: Negative L: Negative    PASSIVE ACCESSORY JOINT MOBILITY Hypomobile CPA C5-7 with moderate pain at restriction. Decreased L to R sideglide C4-7 with mild pain at restriction.        TODAY'S TREATMENT     OBJECTIVE FINDINGS  AROM Cervical flexion:  WNL Cervical extension: Mod motion loss Lateral flexion: Right WNL , Left WNL* Cervical rotation: Right WNL, Left WNL *Indicates pain    Therapeutic Exercise - for improved soft tissue flexibility and extensibility as needed for ROM, cervical spine mobility as needed for neck motion/scanning environment, postural re-edu  Standing row with maintenance of upright posture and cerv retraction; BLUE Tband; 2x12, 3 sec hold Wall ABCs with  towel, wall wash technique; performed A-Z with upright posture and arm starting at night 90 shldr flexion    PATIENT EDUCATION: discussed current progress in PT and expected course of care with remaining visits. Discussed maintenance plan and prognosis.    *not today* Cervical retraction, with patient overpressure; 1x10; increase in pressure along posterior C-spine during, no notable pain reported after Bruegger's; yellow Tband postural endurance drill with upright sitting position, maintaining "W" position of upper extremities; x10, 10 sec hold Seated cervical extension SNAG; 2x10, 1 sec hold at barrier Levator scapulae stretch; 2x30 sec on either side Cervical retraction, with patient overpressure; 1x10; increase in pressure along posterior C-spine during, no change after Cervical retraction; 2x10; increase in pain along L arm during, no worse after     Manual Therapy - for symptom modulation, soft tissue sensitivity and  mobility, joint mobility, nerve root decompression for pain reduction    STM/DTM left UT, levator scapulae, left splenius cervicis/capitis C3-6, left scalenes  x 15 minutes   Manual cervical traction with cervical flexion 45 deg; 10 sec on, 5 sec off; x 5 minutes  Cervical sideglides; L to R ; gr II for pain control x 30 sec, gr III for joint mobility 2x30 sec at each level along C5-6  *hold today* STM/DTM rhomboid major Attempted R SNAG at C2 level with active L rotation, pt has ongoing pain with L rotation and referral into L arm; this technique was stopped due to peripheralizing symptoms Manual cervical traction + retraction; x 10 repetitions, 1 sec hold at end-range (no overpressure)     *not today* Cervical sidebend stretch; reviewed  Trigger Point Dry Needling (TDN), unbilled Education performed with patient regarding potential benefit of TDN as well as pertinent precautions and risks with patient at previous session. Pt provided verbal consent to treatment. TDN performed to left upper trapezius, left levator scapulae, left rhomboid major with 0.25 x 40 single needle placements with local twitch response (LTR). Pistoning technique utilized. Notable post-DN soreness noted today.          PATIENT EDUCATION:  Education details: Plan of care, HEP Person educated: Patient Education method: Explanation Education comprehension: verbalized understanding     HOME EXERCISE PROGRAM: Access Code: [email or web address] URL: [email or web address] Date: 02/21/2022 Prepared by: Valentina Gu   Exercises - Seated Cervical Retraction  - 5-6 x daily - 7 x weekly - 1 sets - 10 reps - sec hold - Seated Upper Trapezius Stretch  - 2 x daily - 7 x weekly - 3 sets - 30sec hold - Seated Scapular Retraction  - 2 x daily - 7 x weekly - 2 sets - 10 reps - 3sec hold     ASSESSMENT:   CLINICAL IMPRESSION:  Patient has remaining deficit with left sidebending and rotation. She does report  mild pain at this time with notably improved activity tolerance with functional use of LUE. Patient has remaining deficits in decreased cervical spine AROM, pain with shoulder elevation, dec shoulder flexion/ABD strength, postural changes, and sensitivity along L upper trap/rhomboid maj/posterior cuff mm. 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 Patient will benefit from skilled continued PT to address above impairments and improve overall function.   REHAB POTENTIAL: Good   CLINICAL DECISION MAKING: Evolving/moderate complexity   EVALUATION COMPLEXITY: Moderate     GOALS: Goals reviewed with patient? Yes   SHORT TERM GOALS: Target date: 03/14/2022   Pt will be independent with HEP to improve strength and decrease neck pain  to improve pain-free function at home and work. Baseline: 02/21/22: Baseline HEP initiated.   03/28/22: Pt has good compliance and sound understanding of HEP as well as exercise techniques.  Goal status: ACHIEVED     LONG TERM GOALS: Target date: 04/04/2022   Pt will increase FOTO to at least 66 to demonstrate significant improvement in function at home and work related to neck pain  Baseline: 02/21/22: 53.   03/28/22: 69 Goal status: ACHIEVED   2.  Pt will decrease worst neck pain by at least 2 points on the NPRS in order to demonstrate clinically significant reduction in neck pain. Baseline: 02/21/22: 8/10 at worst   03/28/22: no more than 2-3/10 over last week  Goal status: ACHIEVED   3.  Pt will have full cervical spine AROM without reproduction of symptoms as needed for scanning environment and performing overhead activity as needed for working on her farm and driving to access community    Baseline: 02/21/22: Motion loss with extension and L rotation, pain with flexion/extension/L rotation.    03/28/22: Motion loss with extension, minimal end-range rotation deficit bilaterally (rotation is [facility]); L paraspinal mild "soreness" in all  directions but R C-spine rotation. Goal status: IN PROGRESS   4.  Pt will perform wall alphabet with hand fixed on wall without reproduction of pain indicative of improved ability to perform repetitive forward reaching activity as needed for refurbishing antique furniture  Baseline: 02/21/22: Pain with forward shoulder flexion and repetitive L upper limb movement.    03/28/22: Performed today with mild discomfort along L ACJ/upper trap near distal insertion.  Goal status: PARTIALLY MET/IN PROGRESS     PLAN: PT FREQUENCY: 2x/week   PT DURATION: 2-3 weeks   PLANNED INTERVENTIONS: Therapeutic exercises, Therapeutic activity, Neuromuscular re-education,  Patient/Family education, Joint mobilization, Dry Needling, Electrical stimulation, Spinal mobilization, Cryotherapy, Moist heat, Traction, and Manual therapy   PLAN FOR NEXT SESSION: continuing with extension principle management, manual therapy and use of SNAGs for cervical spine mobility, postural re-education. Will progress toward independent HEP over next 2 weeks pending no regression in pt condtion.      Valentina Gu, PT, DPT #[id number]  Eilleen Kempf, PT 03/30/2022, 1:52 PM

## 2022-04-04 ENCOUNTER — Encounter: Payer: Self-pay | Admitting: Physical Therapy

## 2022-04-04 ENCOUNTER — Ambulatory Visit: Payer: Medicare HMO | Admitting: Physical Therapy

## 2022-04-04 DIAGNOSIS — M6281 Muscle weakness (generalized): Secondary | ICD-10-CM

## 2022-04-04 DIAGNOSIS — M542 Cervicalgia: Secondary | ICD-10-CM

## 2022-04-04 DIAGNOSIS — M25512 Pain in left shoulder: Secondary | ICD-10-CM

## 2022-04-04 NOTE — Therapy (Signed)
OUTPATIENT PHYSICAL THERAPY TREATMENT   Patient Name: Lauren Leblanc MRN: [id number] DOB:Nov 03, 1950, 71 y.o., female Today's Date: 02/23/2022  PCP: Juline Patch, MD REFERRING PROVIDER: Juline Patch, MD  END OF SESSION:   PT End of Session - 04/04/22 1351     Visit Number 12    Number of Visits 13    Date for PT Re-Evaluation 04/04/22    Authorization Type [employer] 2023    Authorization Time Period VL based on medical necessity    Progress Note Due on Visit 10    PT Start Time 1348    PT Stop Time 1430    PT Time Calculation (min) 42 min    Activity Tolerance Patient tolerated treatment well    Behavior During Therapy WFL for tasks assessed/performed               Past Medical History:  Diagnosis Date   Angina at rest    Arthritis    GERD (gastroesophageal reflux disease)    Hypertension    Past Surgical History:  Procedure Laterality Date   BACK SURGERY  2014   CYST REMOVAL   BREAST BIOPSY Bilateral    neg   DILATION AND CURETTAGE OF UTERUS     TOTAL KNEE ARTHROPLASTY Left 04/13/2020   Procedure: TOTAL KNEE ARTHROPLASTY;  Surgeon: Corky Mull, MD;  Location: [facility] ORS;  Service: Orthopedics;  Laterality: Left;   TUBAL LIGATION     Patient Active Problem List   Diagnosis Date Noted   Statin intolerance 02/06/2022   Spondylolisthesis of lumbar region 06/18/2020   Sacroiliitis ([place]) 06/18/2020   Status post total knee replacement using cement, left 04/13/2020   Esophageal dysphagia 07/04/2019   Familial hypercholesterolemia 07/04/2019   Gastroesophageal reflux disease 07/04/2019   Obesity (BMI 30.0-34.9) 03/13/2018   Primary osteoarthritis of left knee 03/13/2018   Hyperlipidemia, mixed 04/26/2017   Chest pain 04/24/2017   Essential hypertension 01/06/2016    REFERRING DIAG: M54.13 (ICD-10-CM) - Radiculopathy of cervicothoracic region   THERAPY DIAG:  Cervicalgia  Left shoulder pain, unspecified chronicity  Muscle weakness  (generalized)  Rationale for Evaluation and Treatment Rehabilitation  PERTINENT HISTORY: Patient is a 71 year old female with primary complaint of L-sided neck/shoulder pain. Pt reports she will be 2 years s/p TKA this December. She reports injuring her L shoulder when trying to offload LLE. 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 Pt reports pain emanating from L periscapular region and down her arm to her elbow. She reports aching, burning, stinging into her L upper limb. Pt reports taking half dose of muscle relaxer due to sensitivity to medication. Pt denies current visual changes, photosensitivity or sound sensitivity. She did have headache after her head injury, but this it not primary concern at this time. 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    Pain:  Pain Intensity: Present: 2/10, Best: 2/10 (pain mitigated with tizanidine/diclofenac), Worst: 8/10 Pain location: L upper trapezius, L periscapular region and down LUE Pain Quality: burning, stinging aching  Radiating: Yes, down L upper limb Numbness/Tingling: No Focal Weakness: No Aggravating factors: lying on L side or R side, overhead activity,  Relieving factors: pillows around head/neck, Rx medication 24-hour pain behavior: None History of prior neck injury, pain, surgery, or therapy: Yes, Hx of shoulder pain prior to farm  accident - exacerbated by fall  Falls: Has patient fallen in last 6 months? Yes, Number of falls: 2 times when working on farm  Follow-up appointment with MD: No Dominant hand: left Imaging: Yes   Head/neck CT 12/27/21 IMPRESSION: 1.  No intracranial large vessel occlusion or significant stenosis. 2.  No hemodynamically significant stenosis in the neck. 3. No evidence of dissection or aneurysm in the head or neck.     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, music flags (personal history of cancer, h/o spinal tumors, history of compression fracture, chills/fever, night sweats, nausea, vomiting, unrelenting pain): Negative             Hx of precancerous skin lesions only, nausea acutely following head trauma in September    Precautions: Fall Hx with working on farm    [employer] Restrictions: No   Financial controller Lives with: lives with their spouse, daughter and her family live nearby in [place]  Lives in: House/apartment     Patient Goals: Find out cause of left upper quarter pain, getting off of medication     PRECAUTIONS: Fall 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 She reports minimal symptoms at arrival. She states it is still there along L paracervical/L upper trapezius region.    PAIN:  Are you having pain? Yes, 1/10 pain at arrival Pain location: L paracervical region and along L periscapular region     OBJECTIVE: (objective measures completed at initial evaluation unless otherwise dated)    Patient Surveys  FOTO 53, predicted score of 85   Cognition Patient is oriented to person, place, and time.  Recent memory is intact.  Remote memory is intact.  Attention span and concentration are intact.  Expressive speech is intact.  Patient's fund of knowledge is within normal limits for educational level.                          Gross Musculoskeletal Assessment Tremor: None Bulk: Normal Tone: Normal     Posture Forward head, mild rounded shoulders posture   AROM   Shoulder Flexion 150 bilat (pain on L upper trap),  Shoulder Abduction WNL bilat (pain in L deltoid with return to neutral position) 03/28/22:  Shoulder Flexion 150  bilat, Shoulder Abduction WNL bilat    AROM (Normal range in degrees) AROM 02/21/2022 AROM 03/28/22  Cervical   Flexion (50) 55* 56* (little bit sore L paracervical)  Extension (80) 30* 35*  Right lateral flexion (45) 45 43*  Left lateral flexion (45) 43 41*  Right rotation (85) 75 72  Left rotation (85) 57* 73*  (* = pain; Blank rows = not tested)  *Soreness in L cervical paraspinal in all directions with exception of R rotation   MMT   MMT (out of 5) Right 02/21/2022 Left 02/21/2022 Right 03/28/22 Left 03/28/22           Shoulder     Flexion 4 4 4+ 4+  Extension        Abduction 4 4 4+ 4+  Internal rotation        Horizontal abduction        Horizontal adduction        Lower Trapezius        Rhomboids                 Elbow    Flexion 4+ 4+ 5 5  Extension 5 4+ 5 5  Pronation        Supination                 Wrist    Flexion 5 5    Extension 5 5    Radial deviation        Ulnar deviation        (* = pain; Blank rows = not tested)    JAMAR grip strength (best of 3 trials) 03/23/22 R 47.2 lbs L 55.0 lbs   Sensation Grossly intact to light touch bilateral UE as determined by testing dermatomes C2-T2. Proprioception and hot/cold testing deferred on this date.   Reflexes R/L Elbow: 2+/2+  Brachioradialis: 2+/2+  Tricep: 1+/1+ Patellar Tendon 2+/2+ Achilles 2+/1+ Clonus Negative bilat    Palpation   Location LEFT  RIGHT           Suboccipitals      Cervical paraspinals 1 0  Upper Trapezius 2    Levator Scapulae 2    Rhomboid Major/Minor 2    Infraspinatus 2    (Blank rows = not tested) Graded on 0-4 scale (0 = no pain, 1 = pain, 2 = pain with wincing/grimacing/flinching, 3 = pain with withdrawal, 4 = unwilling to allow palpation), (Blank rows = not tested)     Repeated Movements Repeated cervical retraction in supine: no effect during,  minimal symptoms after when lying in supine     SPECIAL TESTS Spurlings A (ipsilateral lateral flexion/axial compression): R: Negative L: Positive Distraction Test: Positive for modest pain relief   Cervical spine compression: Negative Hoffman Sign (cervical cord compression): R: Negative L: Negative    PASSIVE ACCESSORY JOINT MOBILITY Hypomobile CPA C5-7 with moderate pain at restriction. Decreased L to R sideglide C4-7 with mild pain at restriction.        TODAY'S TREATMENT     OBJECTIVE FINDINGS  AROM Cervical extension: Mod motion loss Lateral flexion: Right WNL , Left WNL* Cervical rotation: Right WNL, Left WNL *Indicates pain    Therapeutic Exercise - for improved soft tissue flexibility and extensibility as needed for ROM, cervical spine mobility as needed for neck motion/scanning environment, postural re-edu   Upper body ergometer, 2 minutes forward, 2 minutes backward - for tissue warm-up to improve muscle performance, improved soft tissue mobility/extensibility - 1  minute unbilled time  Standing row with maintenance of upright posture and cerv retraction; BLUE Tband; 2x12, 3 sec hold Wall ABCs with towel, wall wash technique; performed A-Z with upright posture and arm starting at night 90 shldr flexion    PATIENT EDUCATION: discussed current progress in PT and expected course of care with remaining visits. Discussed maintenance plan and prognosis.    *not today* Cervical retraction, with patient overpressure; 1x10; increase in pressure along posterior C-spine during, no notable pain reported after Bruegger's; yellow Tband postural endurance drill with upright sitting position, maintaining "W" position of upper extremities; x10, 10 sec hold Seated cervical extension SNAG; 2x10, 1 sec hold at barrier Levator scapulae stretch; 2x30 sec on either side Cervical retraction, with patient overpressure; 1x10; increase in pressure along posterior C-spine during, no  change after Cervical retraction; 2x10; increase in pain along L arm during, no worse after     Manual Therapy - for symptom modulation, soft tissue sensitivity and mobility, joint mobility, nerve root decompression for pain reduction    STM/DTM left UT, levator scapulae, left splenius cervicis/capitis C3-6, left scalenes  x 15 minutes   Manual cervical traction with cervical flexion 45 deg; 10 sec on, 5 sec off; x 5 minutes  Cervical sideglides; L to R ; gr II for pain control x 30 sec, gr III for joint mobility 2x30 sec at each level along C5-6  *hold today* STM/DTM rhomboid major Attempted R SNAG at C2 level with active L rotation, pt has ongoing pain with L rotation and referral into L arm; this technique was stopped due to peripheralizing symptoms Manual cervical traction + retraction; x 10 repetitions, 1 sec hold at end-range (no overpressure)     *not today* Cervical sidebend stretch; reviewed  Trigger Point Dry Needling (TDN), unbilled Education performed with patient regarding potential benefit of TDN as well as pertinent precautions and risks with patient at previous session. Pt provided verbal consent to treatment. TDN performed to left upper trapezius, left levator scapulae, left rhomboid major with 0.25 x 40 single needle placements with local twitch response (LTR). Pistoning technique utilized. Notable post-DN soreness noted today.          PATIENT EDUCATION:  Education details: Plan of care, HEP Person educated: Patient Education method: Explanation Education comprehension: verbalized understanding     HOME EXERCISE PROGRAM: Access Code: [email or web address] URL: [email or web address] Date: 02/21/2022 Prepared by: Valentina Gu   Exercises - Seated Cervical Retraction  - 5-6 x daily - 7 x weekly - 1 sets - 10 reps - sec hold - Seated Upper Trapezius Stretch  - 2 x daily - 7 x weekly - 3 sets - 30sec hold - Seated Scapular Retraction  - 2 x daily - 7  x weekly - 2 sets - 10 reps - 3sec hold     ASSESSMENT:   CLINICAL IMPRESSION:  Patient has remaining deficit with left sidebending and rotation. She does report mild pain at this time with notably improved activity tolerance with functional use of LUE. Patient has remaining deficits in decreased cervical spine AROM, pain with shoulder elevation, dec shoulder flexion/ABD strength, postural changes, and sensitivity along L upper trap/rhomboid maj/posterior cuff mm. 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 Patient will benefit from skilled continued PT to address above impairments and improve overall function.   REHAB POTENTIAL: Good   CLINICAL DECISION MAKING: Evolving/moderate complexity   EVALUATION COMPLEXITY: Moderate     GOALS: Goals reviewed with patient? Yes  SHORT TERM GOALS: Target date: 03/14/2022   Pt will be independent with HEP to improve strength and decrease neck pain to improve pain-free function at home and work. Baseline: 02/21/22: Baseline HEP initiated.   03/28/22: Pt has good compliance and sound understanding of HEP as well as exercise techniques.  Goal status: ACHIEVED     LONG TERM GOALS: Target date: 04/04/2022   Pt will increase FOTO to at least 66 to demonstrate significant improvement in function at home and work related to neck pain  Baseline: 02/21/22: 53.   03/28/22: 69 Goal status: ACHIEVED   2.  Pt will decrease worst neck pain by at least 2 points on the NPRS in order to demonstrate clinically significant reduction in neck pain. Baseline: 02/21/22: 8/10 at worst   03/28/22: no more than 2-3/10 over last week  Goal status: ACHIEVED   3.  Pt will have full cervical spine AROM without reproduction of symptoms as needed for scanning environment and performing overhead activity as needed for working on her farm and driving to access community    Baseline: 02/21/22: Motion loss with extension and L rotation, pain with  flexion/extension/L rotation.    03/28/22: Motion loss with extension, minimal end-range rotation deficit bilaterally (rotation is [facility]); L paraspinal mild "soreness" in all directions but R C-spine rotation. Goal status: IN PROGRESS   4.  Pt will perform wall alphabet with hand fixed on wall without reproduction of pain indicative of improved ability to perform repetitive forward reaching activity as needed for refurbishing antique furniture  Baseline: 02/21/22: Pain with forward shoulder flexion and repetitive L upper limb movement.    03/28/22: Performed today with mild discomfort along L ACJ/upper trap near distal insertion.  Goal status: PARTIALLY MET/IN PROGRESS     PLAN: PT FREQUENCY: 2x/week   PT DURATION: 2-3 weeks   PLANNED INTERVENTIONS: Therapeutic exercises, Therapeutic activity, Neuromuscular re-education,  Patient/Family education, Joint mobilization, Dry Needling, Electrical stimulation, Spinal mobilization, Cryotherapy, Moist heat, Traction, and Manual therapy   PLAN FOR NEXT SESSION: continuing with extension principle management, manual therapy and use of SNAGs for cervical spine mobility, postural re-education. Will progress toward independent HEP over next 2 weeks pending no regression in pt condtion.      Valentina Gu, PT, DPT #[id number]  Eilleen Kempf, PT 04/04/2022, 1:52 PM

## 2022-04-06 ENCOUNTER — Ambulatory Visit: Payer: Medicare HMO | Admitting: Physical Therapy

## 2022-04-06 DIAGNOSIS — M542 Cervicalgia: Secondary | ICD-10-CM

## 2022-04-06 DIAGNOSIS — M25512 Pain in left shoulder: Secondary | ICD-10-CM

## 2022-04-06 DIAGNOSIS — M6281 Muscle weakness (generalized): Secondary | ICD-10-CM

## 2022-04-06 NOTE — Therapy (Addendum)
OUTPATIENT PHYSICAL THERAPY TREATMENT/GOAL UPDATE   Patient Name: Lauren Leblanc MRN: [id number] DOB:October 18, 1950, 71 y.o., female Today's Date: 04/06/2022  PCP: Duanne Limerick, MD REFERRING PROVIDER: Duanne Limerick, MD  END OF SESSION:   PT End of Session - 04/08/22 2147     Visit Number 13    Number of Visits 13    Date for PT Re-Evaluation --    Authorization Type [employer] 2023    Authorization Time Period VL based on medical necessity    Progress Note Due on Visit 10    PT Start Time 1345    PT Stop Time 1427    PT Time Calculation (min) 42 min    Activity Tolerance Patient tolerated treatment well    Behavior During Therapy WFL for tasks assessed/performed              Past Medical History:  Diagnosis Date   Angina at rest    Arthritis    GERD (gastroesophageal reflux disease)    Hypertension    Past Surgical History:  Procedure Laterality Date   BACK SURGERY  2014   CYST REMOVAL   BREAST BIOPSY Bilateral    neg   DILATION AND CURETTAGE OF UTERUS     TOTAL KNEE ARTHROPLASTY Left 04/13/2020   Procedure: TOTAL KNEE ARTHROPLASTY;  Surgeon: Christena Flake, MD;  Location: [facility] ORS;  Service: Orthopedics;  Laterality: Left;   TUBAL LIGATION     Patient Active Problem List   Diagnosis Date Noted   Statin intolerance 02/06/2022   Spondylolisthesis of lumbar region 06/18/2020   Sacroiliitis (HCC) 06/18/2020   Status post total knee replacement using cement, left 04/13/2020   Esophageal dysphagia 07/04/2019   Familial hypercholesterolemia 07/04/2019   Gastroesophageal reflux disease 07/04/2019   Obesity (BMI 30.0-34.9) 03/13/2018   Primary osteoarthritis of left knee 03/13/2018   Hyperlipidemia, mixed 04/26/2017   Chest pain 04/24/2017   Essential hypertension 01/06/2016    REFERRING DIAG: M54.13 (ICD-10-CM) - Radiculopathy of cervicothoracic region   THERAPY DIAG:  Cervicalgia  Left shoulder pain, unspecified chronicity  Muscle weakness  (generalized)  Rationale for Evaluation and Treatment Rehabilitation  PERTINENT HISTORY: Patient is a 71 year old female with primary complaint of L-sided neck/shoulder pain. Pt reports she will be 2 years s/p TKA this December. She reports injuring her L shoulder when trying to offload LLE. 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 Pt reports pain emanating from L periscapular region and down her arm to her elbow. She reports aching, burning, stinging into her L upper limb. Pt reports taking half dose of muscle relaxer due to sensitivity to medication. Pt denies current visual changes, photosensitivity or sound sensitivity. She did have headache after her head injury, but this it not primary concern at this time. 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    Pain:  Pain Intensity: Present: 2/10, Best: 2/10 (pain mitigated with tizanidine/diclofenac), Worst: 8/10 Pain location: L upper trapezius, L periscapular region and down LUE Pain Quality: burning, stinging aching  Radiating: Yes, down L upper limb Numbness/Tingling: No Focal Weakness: No Aggravating factors: lying on L side or R side, overhead activity,  Relieving factors: pillows around head/neck, Rx medication 24-hour pain behavior: None History of prior neck injury, pain, surgery, or therapy: Yes, Hx of shoulder pain prior to farm  accident - exacerbated by fall  Falls: Has patient fallen in last 6 months? Yes, Number of falls: 2 times when working on farm  Follow-up appointment with MD: No Dominant hand: left Imaging: Yes   Head/neck CT 12/27/21 IMPRESSION: 1.  No intracranial large vessel occlusion or significant stenosis. 2.  No hemodynamically significant stenosis in the neck. 3. No evidence of dissection or aneurysm in the head or neck.     Prior level of function:  Independent Occupational demands: pt is formally retired, works on farm tending to Insurance underwriter: Psychologist, forensic flags (personal history of cancer, h/o spinal tumors, history of compression fracture, chills/fever, night sweats, nausea, vomiting, unrelenting pain): Negative             Hx of precancerous skin lesions only, nausea acutely following head trauma in September    Precautions: Fall Hx with working on farm    [employer]earing Restrictions: No   Economist Lives with: lives with their spouse, daughter and her family live nearby in [place]  Lives in: House/apartment     Patient Goals: Find out cause of left upper quarter pain, getting off of medication     PRECAUTIONS: Fall Hx when working on farm   SUBJECTIVE:                                                                                                                                                                                      SUBJECTIVE STATEMENT:  Patient reports feeling really well in regard to her neck today. She reports sometimes with quickly turning her head, she gets a twinge of pain in L paracervical region. Patient reports 1/10 pain at highest over this past week.  She reports some residual pain - some fleeting pain following therapy. 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    PAIN:  Are you having pain? Yes, 1/10 pain at arrival Pain location: L paracervical region     OBJECTIVE: (objective measures completed at initial evaluation unless otherwise dated)    Patient Surveys  FOTO 53, predicted score of 9   Cognition Patient is oriented to person, place, and time.  Recent memory is intact.  Remote memory is intact.  Attention span and concentration are intact.  Expressive speech is intact.  Patient's fund of knowledge is within normal limits for educational level.                           Gross Musculoskeletal Assessment Tremor: None Bulk: Normal Tone: Normal  Posture Forward head, mild rounded shoulders posture   AROM   Shoulder Flexion 150 bilat (pain on L upper trap), Shoulder Abduction WNL bilat (pain in L deltoid with return to neutral position) 03/28/22:  Shoulder Flexion 150 bilat, Shoulder Abduction WNL bilat    AROM (Normal range in degrees) AROM 02/21/2022 AROM 03/28/22 AROM 04/06/22  Cervical    Flexion (50) 55* 56* (little bit sore L paracervical) 53  Extension (80) 30* 35* 40  Right lateral flexion (45) 45 43* 40  Left lateral flexion (45) 43 41* 33  Right rotation (85) 75 72 85  Left rotation (85) 57* 73* 85  (* = pain; Blank rows = not tested)  *Soreness in L cervical paraspinal in all directions with exception of R rotation   MMT   MMT (out of 5) Right 02/21/2022 Left 02/21/2022 Right 03/28/22 Left 03/28/22           Shoulder     Flexion 4 4 4+ 4+  Extension        Abduction 4 4 4+ 4+  Internal rotation        Horizontal abduction        Horizontal adduction        Lower Trapezius        Rhomboids                 Elbow    Flexion 4+ 4+ 5 5  Extension 5 4+ 5 5  Pronation        Supination                 Wrist    Flexion 5 5    Extension 5 5    Radial deviation        Ulnar deviation        (* = pain; Blank rows = not tested)    JAMAR grip strength (best of 3 trials) 03/23/22 R 47.2 lbs L 55.0 lbs   Sensation Grossly intact to light touch bilateral UE as determined by testing dermatomes C2-T2. Proprioception and hot/cold testing deferred on this date.   Reflexes R/L Elbow: 2+/2+  Brachioradialis: 2+/2+  Tricep: 1+/1+ Patellar Tendon 2+/2+ Achilles 2+/1+ Clonus Negative bilat    Palpation   Location LEFT  RIGHT           Suboccipitals      Cervical paraspinals 1 0  Upper Trapezius 2    Levator Scapulae 2    Rhomboid Major/Minor 2    Infraspinatus 2    (Blank rows = not tested) Graded on 0-4 scale (0  = no pain, 1 = pain, 2 = pain with wincing/grimacing/flinching, 3 = pain with withdrawal, 4 = unwilling to allow palpation), (Blank rows = not tested)     Repeated Movements Repeated cervical retraction in supine: no effect during, minimal symptoms after when lying in supine     SPECIAL TESTS Spurlings A (ipsilateral lateral flexion/axial compression): R: Negative L: Positive Distraction Test: Positive for modest pain relief   Cervical spine compression: Negative Hoffman Sign (cervical cord compression): R: Negative L: Negative    PASSIVE ACCESSORY JOINT MOBILITY Hypomobile CPA C5-7 with moderate pain at restriction. Decreased L to R sideglide C4-7 with mild pain at restriction.        TODAY'S TREATMENT     OBJECTIVE FINDINGS  AROM Cervical extension: Mod motion loss Lateral flexion: Right WNL , Left WNL* Cervical rotation: Right WNL, Left WNL *Indicates pain    Therapeutic Exercise -  for improved soft tissue flexibility and extensibility as needed for ROM, cervical spine mobility as needed for neck motion/scanning environment, postural re-edu  GOAL UPDATE PERFORMED   Upper body ergometer, 2 minutes forward, 2 minutes backward - for tissue warm-up to improve muscle performance, improved soft tissue mobility/extensibility - 1 minute unbilled time   Standing bilateral external rotation with scap retraction, back to wall for postural cue; Green Tband, 2x10   -verbal cueing and demo for shoulder ER versus HABD and elbow extension Standing row with maintenance of upright posture and cerv retraction; Black Tband; 2x10, 3 sec hold    PATIENT EDUCATION: Discussed goals met in PT and appropriateness for transition to home program. Reviewed principles for maintenance phase MDT and updated patient's home exercise program. Resistance bands were provided to patient.     *not today* Wall ABCs with towel, wall wash technique; performed A-Z with upright posture and arm starting at  night 90 shldr flexion Cervical retraction, with patient overpressure; 1x10; increase in pressure along posterior C-spine during, no notable pain reported after Bruegger's; yellow Tband postural endurance drill with upright sitting position, maintaining "W" position of upper extremities; x10, 10 sec hold Seated cervical extension SNAG; 2x10, 1 sec hold at barrier Levator scapulae stretch; 2x30 sec on either side Cervical retraction, with patient overpressure; 1x10; increase in pressure along posterior C-spine during, no change after Cervical retraction; 2x10; increase in pain along L arm during, no worse after     Manual Therapy - for symptom modulation, soft tissue sensitivity and mobility, joint mobility, nerve root decompression for pain reduction    STM/DTM left UT, levator scapulae, left splenius cervicis/capitis C3-6, left scalenes  x 15 minutes   Manual cervical traction with cervical flexion 45 deg; 10 sec on, 5 sec off; x 5 minutes  Cervical sideglides; L to R ; gr II for pain control x 30 sec, gr III for joint mobility 2x30 sec at each level along C5-6  MET for L rotation; with antagonist contraction for 5 sec and active L rotation during relax phase; performed x 5 repetitions  *hold today* STM/DTM rhomboid major Attempted R SNAG at C2 level with active L rotation, pt has ongoing pain with L rotation and referral into L arm; this technique was stopped due to peripheralizing symptoms Manual cervical traction + retraction; x 10 repetitions, 1 sec hold at end-range (no overpressure)     *not today* Cervical sidebend stretch; reviewed  Trigger Point Dry Needling (TDN), unbilled Education performed with patient regarding potential benefit of TDN as well as pertinent precautions and risks with patient at previous session. Pt provided verbal consent to treatment. TDN performed to left upper trapezius, left levator scapulae, left rhomboid major with 0.25 x 40 single needle  placements with local twitch response (LTR). Pistoning technique utilized. Notable post-DN soreness noted today.          PATIENT EDUCATION:  Education details: Plan of care, HEP Person educated: Patient Education method: Explanation Education comprehension: verbalized understanding     HOME EXERCISE PROGRAM: Access Code: [email or web address] URL: [email or web address] Date: 02/21/2022 Prepared by: Consuela Mimes   Exercises - Seated Cervical Retraction  - 5-6 x daily - 7 x weekly - 1 sets - 10 reps - sec hold - Seated Upper Trapezius Stretch  - 2 x daily - 7 x weekly - 3 sets - 30sec hold - Seated Scapular Retraction  - 2 x daily - 7 x weekly - 2 sets - 10 reps - 3sec hold  ASSESSMENT:   CLINICAL IMPRESSION:  Patient has made good progress in spite of difficulties with pain control early during plan of care. She demonstrates normal cervical spine rotation and functional ROM for lateral flexion in both directions. She does have moderate motion loss and stiffness with extension that has minimally changed - this may be due to chronic postural changes/skeletal changes. Pt has low NPRS at this time and is managing well with her current HEP. Updated HEP for further postural re-education work pt can continue outside of clinic. Pt is able to perform simple task requiring sustained shoulder elevation moving in multiple combined planes of motion (wall ABCs with towel on wall) without significant pain - simulating performing work with sustained elevated shoulder as needed for completing household chores and work on farm. Pt met her FOTO score at previous progress note and is self-motivated to maintain progress and continue with IND home program. Pt is appropriate for trial of HEP at this time and may f/u with clinic over next 30 days if experiencing lapse in progress or regression in her condition.    REHAB POTENTIAL: Good   CLINICAL DECISION MAKING: Evolving/moderate complexity    EVALUATION COMPLEXITY: Moderate     GOALS: Goals reviewed with patient? Yes   SHORT TERM GOALS: Target date: 03/14/2022   Pt will be independent with HEP to improve strength and decrease neck pain to improve pain-free function at home and work. Baseline: 02/21/22: Baseline HEP initiated.   03/28/22: Pt has good compliance and sound understanding of HEP as well as exercise techniques.  Goal status: ACHIEVED     LONG TERM GOALS: Target date: 04/04/2022   Pt will increase FOTO to at least 66 to demonstrate significant improvement in function at home and work related to neck pain  Baseline: 02/21/22: 53.   03/28/22: 69 Goal status: ACHIEVED   2.  Pt will decrease worst neck pain by at least 2 points on the NPRS in order to demonstrate clinically significant reduction in neck pain. Baseline: 02/21/22: 8/10 at worst   03/28/22: no more than 2-3/10 over last week   04/06/22: no more than 1/10 Goal status: ACHIEVED   3.  Pt will have full cervical spine AROM without reproduction of symptoms as needed for scanning environment and performing overhead activity as needed for working on her farm and driving to access community    Baseline: 02/21/22: Motion loss with extension and L rotation, pain with flexion/extension/L rotation.    03/28/22: Motion loss with extension, minimal end-range rotation deficit bilaterally (rotation is [facility]); L paraspinal mild "soreness" in all directions but R C-spine rotation.   04/06/22: Good cervical spine rotation bilateraly, fair sidebend ROM, moderate motion loss with extension. No significant pain with AROM.  Goal status: PARTIALLY MET    4.  Pt will perform wall alphabet with hand fixed on wall without reproduction of pain indicative of improved ability to perform repetitive forward reaching activity as needed for refurbishing antique furniture  Baseline: 02/21/22: Pain with forward shoulder flexion and repetitive L upper limb movement.    03/28/22: Performed today  with mild discomfort along L ACJ/upper trap near distal insertion.   04/06/22: Performed today with relative ease and no c/o pain.  Goal status: ACHIEVED      PLAN: PT FREQUENCY: -   PT DURATION: -   PLAN FOR NEXT SESSION: Trial of independent home exercise program. Will D/C if no further f/u required in next month.      Consuela Mimes, PT,  DPT #[id number]  Gertie Exon, PT 04/09/2022, 6:26 AM

## 2022-04-08 ENCOUNTER — Encounter: Payer: Self-pay | Admitting: Physical Therapy

## 2022-05-29 ENCOUNTER — Encounter: Payer: Self-pay | Admitting: Emergency Medicine

## 2022-05-29 ENCOUNTER — Ambulatory Visit: Payer: Medicare HMO | Admitting: Dermatology

## 2022-05-29 ENCOUNTER — Ambulatory Visit
Admission: EM | Admit: 2022-05-29 | Discharge: 2022-05-29 | Disposition: A | Discharge status: Home / Self Care | Payer: Medicare HMO | Attending: Emergency Medicine | Admitting: Emergency Medicine

## 2022-05-29 DIAGNOSIS — Z203 Contact with and (suspected) exposure to rabies: Secondary | ICD-10-CM

## 2022-05-29 DIAGNOSIS — W5501XA Bitten by cat, initial encounter: Secondary | ICD-10-CM | POA: Diagnosis not present

## 2022-05-29 DIAGNOSIS — S61032A Puncture wound without foreign body of left thumb without damage to nail, initial encounter: Secondary | ICD-10-CM | POA: Diagnosis not present

## 2022-05-29 MED ORDER — DOXYCYCLINE HYCLATE 100 MG PO CAPS: 100.0000 mg | ORAL_CAPSULE | Freq: Two times a day (BID) | ORAL | 0 refills | Status: DC

## 2022-05-29 MED ORDER — RABIES VACCINE, PCEC IM SUSR
1.0000 mL | Freq: Once | INTRAMUSCULAR | Status: AC
  Administered 2022-05-29: 1 mL via INTRAMUSCULAR

## 2022-05-29 MED ORDER — RABIES IMMUNE GLOBULIN 150 UNIT/ML IM INJ
20.0000 [IU]/kg | INJECTION | Freq: Once | INTRAMUSCULAR | Status: AC
  Administered 2022-05-29: 1875 [IU] via INTRAMUSCULAR

## 2022-05-29 NOTE — ED Provider Notes (Signed)
[facility]    CSN: [id number] Arrival date & time: 05/29/22  3532      History   Chief Complaint Chief Complaint  Patient presents with   Puncture Wound    HPI Lauren Leblanc is a 72 y.o. female.   HPI  72 year old female here for evaluation of left thumb injury.  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  This morning she picked up 29-68-week-old kitten that she had been feeding and the cat bit her left thumb causing a puncture wound into the pad.  She states that the wound did bleed and she washed it out and applied Neosporin.  She took the kitten to the vet and the vet put the cat down and is sending the to evaluate for rabies.  The patient reports that the vet said that the cat was "very sick".  Her last tetanus shot was a Tdap on 12/06/2018.  The vet recommended that she come to the urgent care for evaluation and treatment.  She does want rabies prophylaxis.  Past Medical History:  Diagnosis Date   Angina at rest    Arthritis    GERD (gastroesophageal reflux disease)    Hypertension     Patient Active Problem List   Diagnosis Date Noted   Statin intolerance 02/06/2022   Spondylolisthesis of lumbar region 06/18/2020   Sacroiliitis ([place]) 06/18/2020   Status post total knee replacement using cement, left 04/13/2020   Esophageal dysphagia 07/04/2019   Familial hypercholesterolemia 07/04/2019   Gastroesophageal reflux disease 07/04/2019   Obesity (BMI 30.0-34.9) 03/13/2018   Primary osteoarthritis of left knee 03/13/2018   Hyperlipidemia, mixed 04/26/2017   Chest pain 04/24/2017   Essential hypertension 01/06/2016    Past Surgical History:  Procedure Laterality Date   BACK SURGERY  2014   CYST REMOVAL   BREAST BIOPSY Bilateral    neg   DILATION AND CURETTAGE OF UTERUS     TOTAL KNEE ARTHROPLASTY Left 04/13/2020   Procedure: TOTAL KNEE ARTHROPLASTY;  Surgeon: Corky Mull, MD;  Location: [facility]  [facility];  Service: Orthopedics;  Laterality: Left;   TUBAL LIGATION      OB History   No obstetric history on file.      Home Medications    Prior to Admission medications   Medication Sig Start Date End Date Taking? Authorizing Provider  doxycycline (VIBRAMYCIN) 100 MG capsule Take 1 capsule (100 mg total) by mouth 2 (two) times daily. 05/29/22  Yes Margarette Canada, NP  amLODipine (NORVASC) 5 MG tablet Take 1 tablet (5 mg total) by mouth daily. 02/03/22   Juline Patch, MD  Calcium Carbonate-Vit D-Min (CALCIUM 1200 PO) Take 1 tablet by mouth daily.     [provider]  diclofenac (VOLTAREN) 75 MG EC tablet Take 1 tablet (75 mg total) by mouth 2 (two) times daily with a meal. 02/13/22   Crain, Whitney L, PA  ibuprofen (ADVIL) 600 MG tablet Take 1 tablet (600 mg total) by mouth every 6 (six) hours as needed. 12/26/21   Margarette Canada, NP  Lactobacillus-Inulin (CULTURELLE DIGESTIVE HEALTH PO) Take 1 capsule by mouth daily.    [provider]  metoprolol succinate (TOPROL-XL) 25 MG 24 hr tablet Take 1 tablet (25 mg total) by mouth daily. 02/03/22   Juline Patch, MD  Multiple Vitamins-Minerals (ONE-A-DAY WOMENS 50 PLUS PO) Take 1 tablet by mouth daily.    [provider]  nitroGLYCERIN (  NITROSTAT) 0.4 MG SL tablet Place 1 tablet (0.4 mg total) under the tongue every 5 (five) minutes as needed for chest pain. 08/03/21   Juline Patch, MD  OVER THE COUNTER MEDICATION Take 2 each by mouth daily. Neuriva    [provider]  pantoprazole (PROTONIX) 40 MG tablet Take 1 tablet (40 mg total) by mouth daily. 02/03/22   Juline Patch, MD  tiZANidine (ZANAFLEX) 4 MG tablet Take 1 tablet (4 mg total) by mouth every 6 (six) hours as needed for muscle spasms. 02/13/22   Crain, Loree Fee L, PA  Vitamin D, Cholecalciferol, 25 MCG (1000 UT) TABS Take 1,000 Units by mouth daily.     [provider]    Family History Family History  Problem Relation Age of Onset    Coronary artery disease Mother 74   Hypertension Mother    Stroke Father    Diabetes Father    Hypertension Sister    Allergies Sister    Breast cancer Maternal Aunt        3 mat aunts   Breast cancer Maternal Aunt    Breast cancer Maternal Aunt     Social History Social History   Tobacco Use   Smoking status: Never   Smokeless tobacco: Never  Vaping Use   Vaping Use: Never used  Substance Use Topics   Alcohol use: No    Alcohol/week: 0.0 standard drinks of alcohol   Drug use: No     Allergies   Lisinopril, Codeine, and Hydrochlorothiazide   Review of Systems Review of Systems  Constitutional:  Negative for fever.  Musculoskeletal:  Positive for myalgias. Negative for arthralgias and joint swelling.  Skin:  Positive for wound. Negative for color change.     Physical Exam Triage Vital Signs ED Triage Vitals  Enc Vitals Group     BP 05/29/22 1031 (!) 160/79     Pulse Rate 05/29/22 1030 83     Resp 05/29/22 1030 16     Temp 05/29/22 1030 (!) 97.5 F (36.4 C)     Temp Source 05/29/22 1030 Oral     SpO2 05/29/22 1031 99 %     Weight --      Height --      Head Circumference --      Peak Flow --      Pain Score --      Pain Loc --      Pain Edu? --      Excl. in [place]? --    No data found.  Updated Vital Signs BP (!) 160/79 (BP Location: Left Arm)   Pulse 65   Temp (!) 97.5 F (36.4 C) (Oral)   Resp 16   Wt 205 lb 6.4 oz (93.2 kg)   SpO2 99%   BMI 34.18 kg/m   Visual Acuity Right Eye Distance:   Left Eye Distance:   Bilateral Distance:    Right Eye Near:   Left Eye Near:    Bilateral Near:     Physical Exam Vitals and nursing note reviewed.  Constitutional:      Appearance: Normal appearance. She is not ill-appearing.  HENT:     Head: Normocephalic and atraumatic.  Musculoskeletal:        General: Swelling, tenderness and signs of injury present. No deformity.  Skin:    General: Skin is warm and dry.     Capillary Refill: Capillary  refill takes less than 2 seconds.     Findings:  No bruising or erythema.  Neurological:     General: No focal deficit present.     Mental Status: She is alert and oriented to person, place, and time.  Psychiatric:        Mood and Affect: Mood normal.        Behavior: Behavior normal.        Thought Content: Thought content normal.        Judgment: Judgment normal.      UC Treatments / Results  Labs (all labs ordered are listed, but only abnormal results are displayed) Labs Reviewed - No data to display  EKG   Radiology No results found.  Procedures Procedures (including critical care time)  Medications Ordered in UC Medications  rabies immune globulin (HYPERRAB/KEDRAB) injection 1,875 Units (has no administration in time range)  rabies vaccine (RABAVERT) injection 1 mL (has no administration in time range)    Initial Impression / Assessment and Plan / UC Course  I have reviewed the triage vital signs and the nursing notes.  Pertinent labs & imaging results that were available during my care of the patient were reviewed by me and considered in my medical decision making (see chart for details).   Patient is a very pleasant, nontoxic-appearing 72 year old female here for evaluation of a puncture wound to the pad of her left thumb that she sustained this morning from [place]itten [place].  Patient can see the image above there is a small puncture wound but there is no surrounding erythema.  The thumb is mildly swollen and slightly tender but patient has full range of motion and sensation.  There is no induration or fluctuance noted.  I will order rabies prophylaxis.  The patient's tetanus shot is up-to-date.  I will also discharge her home on doxycycline 100 mg twice daily for 10 days for prevention of infection.  Continue rabies immunization schedule and return precautions reviewed with patient.  Final Clinical Impressions(s) / UC Diagnoses   Final diagnoses:   Puncture wound of left thumb, initial encounter  Cat bite, initial encounter  Need for post exposure prophylaxis for rabies     Discharge Instructions      Take the doxycycline twice daily with food for 10 days for prevention of infection from your cat bite.  The wound on your thumb clean and dry.  You may cover with a Band-Aid when you are out in public and leave it open to air when you are at home.  Observe for any swelling, redness, increased pain, red streaks going up your arm, or fever.  If any of those develop you need to go to the ER for evaluation.  You have been given your first rabies vaccination today which is day 0.  You will need to return on day 3, day 7, and day 14 for the remainder of the immunization series.     ED Prescriptions     Medication Sig Dispense Auth. Provider   doxycycline (VIBRAMYCIN) 100 MG capsule Take 1 capsule (100 mg total) by mouth 2 (two) times daily. 20 capsule Margarette Canada, NP      PDMP not reviewed this encounter.   Margarette Canada, NP 05/29/22 1137

## 2022-05-29 NOTE — Discharge Instructions (Addendum)
Take the doxycycline twice daily with food for 10 days for prevention of infection from your cat bite.  The wound on your thumb clean and dry.  You may cover with a Band-Aid when you are out in public and leave it open to air when you are at home.  Observe for any swelling, redness, increased pain, red streaks going up your arm, or fever.  If any of those develop you need to go to the ER for evaluation.  You have been given your first rabies vaccination today which is day 0.  You will need to return on day 3, day 7, and day 14 for the remainder of the immunization series.

## 2022-05-29 NOTE — ED Triage Notes (Signed)
Pt [place] and has a puncture wound to her left thumb.

## 2022-06-01 ENCOUNTER — Ambulatory Visit: Payer: Medicare HMO

## 2022-06-19 ENCOUNTER — Emergency Department: Payer: Medicare HMO

## 2022-06-19 ENCOUNTER — Emergency Department
Admission: EM | Admit: 2022-06-19 | Discharge: 2022-06-19 | Disposition: A | Discharge status: Home / Self Care | Payer: Medicare HMO | Attending: Emergency Medicine | Admitting: Emergency Medicine

## 2022-06-19 ENCOUNTER — Other Ambulatory Visit: Payer: Self-pay

## 2022-06-19 DIAGNOSIS — M25532 Pain in left wrist: Secondary | ICD-10-CM | POA: Diagnosis not present

## 2022-06-19 DIAGNOSIS — I10 Essential (primary) hypertension: Secondary | ICD-10-CM | POA: Diagnosis not present

## 2022-06-19 DIAGNOSIS — S0990XA Unspecified injury of head, initial encounter: Secondary | ICD-10-CM

## 2022-06-19 DIAGNOSIS — R519 Headache, unspecified: Secondary | ICD-10-CM | POA: Insufficient documentation

## 2022-06-19 DIAGNOSIS — W19XXXA Unspecified fall, initial encounter: Secondary | ICD-10-CM | POA: Insufficient documentation

## 2022-06-19 DIAGNOSIS — M25562 Pain in left knee: Secondary | ICD-10-CM | POA: Insufficient documentation

## 2022-06-19 DIAGNOSIS — Z743 Need for continuous supervision: Secondary | ICD-10-CM | POA: Diagnosis not present

## 2022-06-19 DIAGNOSIS — S0083XA Contusion of other part of head, initial encounter: Secondary | ICD-10-CM | POA: Insufficient documentation

## 2022-06-19 DIAGNOSIS — S0003XA Contusion of scalp, initial encounter: Secondary | ICD-10-CM | POA: Diagnosis not present

## 2022-06-19 DIAGNOSIS — R58 Hemorrhage, not elsewhere classified: Secondary | ICD-10-CM | POA: Diagnosis not present

## 2022-06-19 MED ORDER — BACITRACIN-NEOMYCIN-POLYMYXIN OINTMENT TUBE
TOPICAL_OINTMENT | Freq: Once | CUTANEOUS | Status: AC
  Filled 2022-06-19: qty 14.17

## 2022-06-19 NOTE — ED Triage Notes (Signed)
Patient to ED via [facility] from the vet office after falling off the curb. Patient has cuts on bridge of nose and beside left eye. C/o pain on left wrist, left knee and a headache. Denies LOC or blood thinners. AOX4.

## 2022-06-19 NOTE — Discharge Instructions (Addendum)
Your exam and CT scans are normal and reassuring at this time.  No signs of a serious injury related to your fall.  Take OTC Tylenol or Motrin as needed for pain relief.  Apply ice to reduce swelling.  Follow-up with your primary provider or return to the ED if needed.

## 2022-06-20 ENCOUNTER — Telehealth: Payer: Self-pay

## 2022-06-20 NOTE — ED Provider Notes (Signed)
[facility] Emergency Department Provider Note     Event Date/Time   First MD Initiated Contact with Patient 06/19/22 1924     (approximate)   History   Fall (/)   HPI  Lauren Leblanc is a 72 y.o. female with a history of hypertension and GERD, presents to the ED via EMS from a business.  Patient transports via EMS from the vets office after mechanical fall causing her to trip off the curb.  Patient presents with abrasions across the nasal bridge and to the left side of the eye.  Patient also complaints of pain to the left wrist, left knee, and a mild headache pain no reports of any LOC or blood thinner use.  Physical Exam   Triage Vital Signs: ED Triage Vitals  Enc Vitals Group     BP 06/19/22 1735 (!) 167/83     Pulse Rate 06/19/22 1735 65     Resp 06/19/22 1735 18     Temp 06/19/22 1735 97.9 F (36.6 C)     Temp Source 06/19/22 1735 Oral     SpO2 06/19/22 1735 100 %     Weight --      Height --      Head Circumference --      Peak Flow --      Pain Score 06/19/22 1736 7     Pain Loc --      Pain Edu? --      Excl. in [place]? --     Most recent vital signs: Vitals:   06/19/22 1735  BP: (!) 167/83  Pulse: 65  Resp: 18  Temp: 97.9 F (36.6 C)  SpO2: 100%    General Awake, no distress. NAD HEENT NCAT, except for abrasions across the nasal bridge, the left brow, and the left cheek.  No active epistaxis. PERRL. EOMI. No rhinorrhea. Mucous membranes are moist.  EMs intact bilaterally. CV:  Good peripheral perfusion.  RESP:  Normal effort.  ABD:  No distention.  MSK:  Spinal alignment without midline tenderness, spasm, deformity, or step-off.  Normal active range of motion the upper and lower extremities.  Normal composite fist bilaterally.   ED Results / Procedures / Treatments   Labs (all labs ordered are listed, but only abnormal results are displayed) Labs Reviewed - No data to display   EKG    RADIOLOGY  I  personally viewed and evaluated these images as part of my medical decision making, as well as reviewing the written report by the radiologist.  ED Provider Interpretation: no acute findings  CT Maxillofacial Wo Contrast  Result Date: 06/19/2022 CLINICAL DATA:  Blunt facial trauma after falling off a curb with cut to the bridge of the nose and beside the left eye. EXAM: CT MAXILLOFACIAL WITHOUT CONTRAST TECHNIQUE: Multidetector CT imaging of the maxillofacial structures was performed. Multiplanar CT image reconstructions were also generated. RADIATION DOSE REDUCTION: This exam was performed according to the departmental dose-optimization program which includes automated exposure control, adjustment of the mA and/or kV according to patient size and/or use of iterative reconstruction technique. COMPARISON:  CT head earlier today FINDINGS: Osseous: No fracture or mandibular dislocation. No destructive process. Orbits: Negative. No traumatic or inflammatory finding. Sinuses: Clear. Soft tissues: Small left frontal scalp soft tissue hematoma. Limited intracranial: No significant or unexpected finding. IMPRESSION: 1. No evidence of facial bone fracture. 2. Small left frontal scalp soft tissue hematoma. Electronically Signed   By: Carroll Kinds.D.  On: 06/19/2022 19:54   DG Wrist Complete Left  Result Date: 06/19/2022 CLINICAL DATA:  Fall off curb left wrist pain. EXAM: LEFT WRIST - COMPLETE 3+ VIEW COMPARISON:  None Available. FINDINGS: Severe thumb carpometacarpal joint space narrowing, subchondral sclerosis, and peripheral osteophytosis. Moderate triscaphe joint space narrowing. No acute fracture is seen. No dislocation. IMPRESSION: 1. No acute fracture. 2. Severe thumb carpometacarpal osteoarthritis. Electronically Signed   By: Yvonne Kendall M.D.   On: 06/19/2022 18:08   CT Head Wo Contrast  Result Date: 06/19/2022 CLINICAL DATA:  Trauma EXAM: CT HEAD WITHOUT CONTRAST TECHNIQUE: Contiguous axial  images were obtained from the base of the skull through the vertex without intravenous contrast. RADIATION DOSE REDUCTION: This exam was performed according to the departmental dose-optimization program which includes automated exposure control, adjustment of the mA and/or kV according to patient size and/or use of iterative reconstruction technique. COMPARISON:  CT Head 12/27/21 FINDINGS: Brain: No evidence of acute infarction, hemorrhage, hydrocephalus, extra-axial collection or mass lesion/mass effect. Sequela of mild to moderate chronic microvascular ischemic change. Vascular: No hyperdense vessel or unexpected calcification. Skull: Small left frontal scalp soft tissue hematoma. Sinuses/Orbits: No middle ear or mastoid effusion. Paranasal sinuses are clear. Orbits are unremarkable. Other: None. IMPRESSION: Small left frontal scalp soft tissue hematoma. No acute intracranial abnormality. Electronically Signed   By: Marin Roberts M.D.   On: 06/19/2022 18:05     PROCEDURES:  Critical Care performed: No  Procedures   MEDICATIONS ORDERED IN ED: Medications  neomycin-bacitracin-polymyxin (NEOSPORIN) ointment ( Topical Given 06/19/22 2042)     IMPRESSION / MDM / ASSESSMENT AND PLAN / ED COURSE  I reviewed the triage vital signs and the nursing notes.                              Differential diagnosis includes, but is not limited to, SDH, facial fracture, nasal bone fracture, concussion, facial abrasions  Patient's presentation is most consistent with acute complicated illness / injury requiring diagnostic workup.  Patient's diagnosis is consistent with medical fall resulting in facial abrasion and wrist sprain.  Radiologic evidence of any acute intracranial process, facial bone fracture, wrist fracture, based on my interpretation of images.  Patient will be discharged home with wound care instructions for facial abrasions. Patient is to follow up with her primary provider as needed or otherwise  directed. Patient is given ED precautions to return to the ED for any worsening or new symptoms.     FINAL CLINICAL IMPRESSION(S) / ED DIAGNOSES   Final diagnoses:  Fall, initial encounter  Contusion of face, initial encounter  Minor head injury, initial encounter     Rx / DC Orders   ED Discharge Orders     None        Note:  This document was prepared using Dragon voice recognition software and may include unintentional dictation errors.    Melvenia Needles, PA-C 06/20/22 1538    Arta Silence, MD 06/21/22 1601

## 2022-06-20 NOTE — Transitions of Care (Post Inpatient/ED Visit) (Signed)
   06/20/2022  Name: Lauren Leblanc MRN: [id number] DOB: [birth year]  Today's TOC FU Call Status: Today's TOC FU Call Status:: Successful TOC FU Call Competed Unsuccessful Call (1st Attempt) Date: 06/20/22 [facility] FU Call Complete Date: 06/20/22  Transition Care Management Follow-up Telephone Call Date of Discharge: 06/19/22 Discharge Facility: [facility]) Type of Discharge: Emergency Department Reason for ED Visit: Other: (fell in front of vet's office/ hit head) How have you been since you were released from the hospital?: Same (face is sore from hitting concrete) Any questions or concerns?: No  Items Reviewed: Did you receive and understand the discharge instructions provided?: Yes Medications obtained and verified?: Yes (Medications Reviewed) Any new allergies since your discharge?: No Dietary orders reviewed?: No Do you have support at home?: Yes People in Home: spouse Name of Support/Comfort Primary Source: [place] and Equipment/Supplies: [place] Ordered?: No Any new equipment or medical supplies ordered?: No  Functional Questionnaire: Do you need assistance with bathing/showering or dressing?: No Do you need assistance with meal preparation?: No Do you need assistance with eating?: No Do you have difficulty maintaining continence: No Do you need assistance with getting out of bed/getting out of a chair/moving?: No Do you have difficulty managing or taking your medications?: No  Folllow up appointments reviewed: PCP Follow-up appointment confirmed?: Yes Date of PCP follow-up appointment?: 06/26/22 Follow-up Provider: [facility] Follow-up appointment confirmed?: NA Do you need transportation to your follow-up appointment?: No Do you understand care options if your condition(s) worsen?: Yes-patient verbalized understanding    SIGNATURE Berton Mount

## 2022-06-20 NOTE — Transitions of Care (Post Inpatient/ED Visit) (Deleted)
   06/20/2022  Name: Lauren Leblanc MRN: [id number] DOB: [birth year]  Today's TOC FU Call Status: Today's TOC FU Call Status:: Unsuccessul Call (1st Attempt) Unsuccessful Call (1st Attempt) Date: 06/20/22  Attempted to reach the patient regarding the most recent Inpatient/ED visit.  Follow Up Plan: Additional outreach attempts will be made to reach the patient to complete the Transitions of Care (Post Inpatient/ED visit) call.   Signature ***

## 2022-06-26 ENCOUNTER — Encounter: Payer: Self-pay | Admitting: Family Medicine

## 2022-06-26 ENCOUNTER — Ambulatory Visit (INDEPENDENT_AMBULATORY_CARE_PROVIDER_SITE_OTHER): Payer: Medicare HMO | Admitting: Family Medicine

## 2022-06-26 VITALS — BP 130/90 | HR 60 | Ht 65.0 in | Wt 208.0 lb

## 2022-06-26 DIAGNOSIS — S63502D Unspecified sprain of left wrist, subsequent encounter: Secondary | ICD-10-CM | POA: Diagnosis not present

## 2022-06-26 DIAGNOSIS — S0993XD Unspecified injury of face, subsequent encounter: Secondary | ICD-10-CM

## 2022-06-26 DIAGNOSIS — I10 Essential (primary) hypertension: Secondary | ICD-10-CM

## 2022-06-26 MED ORDER — METOPROLOL SUCCINATE ER 50 MG PO TB24: 50.0000 mg | ORAL_TABLET | Freq: Every day | ORAL | 1 refills | Status: DC

## 2022-06-26 NOTE — Progress Notes (Signed)
Date:  06/26/2022   Name:  Lauren Leblanc   DOB:  1951-02-15   MRN:  [id number]   Chief Complaint: Follow-up (ER on 06/19/22 for a fall- TOC placed on 06/20/22- face has healed- no fractures)  Patient is a 72 year old female who presents for a ER followup exam exam. The patient reports the following problems: elevated blood pressure. Health maintenance has been reviewed up to date.    Facial Injury  The incident occurred 5 to 7 days ago. The injury mechanism was a fall. There was no loss of consciousness. There was no blood loss. The quality of the pain is described as aching. The pain has been improving since the injury. Pertinent negatives include no blurred vision, disorientation, headaches, memory loss, numbness, tinnitus, vomiting or weakness. She has tried nothing for the symptoms. The treatment provided moderate relief.  Head Injury  The incident occurred 5 to 7 days ago. The injury mechanism was a fall. The volume of blood lost was moderate. The quality of the pain is described as aching. The pain is moderate. The pain has been fluctuating since the injury. Pertinent negatives include no blurred vision, disorientation, headaches, memory loss, numbness, tinnitus, vomiting or weakness. The treatment provided mild relief.  Wrist Injury  The incident occurred more than 1 week ago. The injury mechanism was a fall. The quality of the pain is described as aching. The pain has been Improving since the incident. Pertinent negatives include no numbness. She has tried nothing for the symptoms. The treatment provided moderate relief.    Lab Results  Component Value Date   NA 143 12/27/2021   K 3.8 12/27/2021   CO2 24 12/27/2021   GLUCOSE 95 12/27/2021   BUN 21 12/27/2021   CREATININE 0.85 12/27/2021   CALCIUM 9.4 12/27/2021   EGFR 68 08/03/2021   GFRNONAA >60 12/27/2021   Lab Results  Component Value Date   CHOL 219 (H) 08/03/2021   HDL 54 08/03/2021   LDLCALC 142 (H) 08/03/2021    LDLDIRECT 122 (H) 02/03/2022   TRIG 131 08/03/2021   CHOLHDL 5.3 04/25/2017   Lab Results  Component Value Date   TSH 1.359 12/11/2018   No results found for: "HGBA1C" Lab Results  Component Value Date   WBC 5.5 12/27/2021   HGB 12.6 12/27/2021   HCT 38.7 12/27/2021   MCV 85.8 12/27/2021   PLT 268 12/27/2021   Lab Results  Component Value Date   ALT 13 04/02/2020   AST 18 04/02/2020   ALKPHOS 62 04/02/2020   BILITOT 0.7 04/02/2020   No results found for: "25OHVITD2", "25OHVITD3", "VD25OH"   Review of Systems  Constitutional: Negative.  Negative for chills, fatigue, fever and unexpected weight change.  HENT:  Negative for congestion, ear discharge, ear pain, rhinorrhea, sinus pressure, sneezing, sore throat and tinnitus.   Eyes:  Negative for blurred vision.  Respiratory:  Negative for cough, shortness of breath, wheezing and stridor.   Gastrointestinal:  Negative for abdominal pain, blood in stool, constipation, diarrhea, nausea and vomiting.  Genitourinary:  Negative for dysuria, flank pain, frequency, hematuria, urgency and vaginal discharge.  Musculoskeletal:  Negative for arthralgias, back pain and myalgias.  Skin:  Negative for rash.  Neurological:  Negative for dizziness, weakness, numbness and headaches.  Hematological:  Negative for adenopathy. Does not bruise/bleed easily.  Psychiatric/Behavioral:  Negative for dysphoric mood and memory loss. The patient is not nervous/anxious.     Patient Active Problem List   Diagnosis  Date Noted   Statin intolerance 02/06/2022   Spondylolisthesis of lumbar region 06/18/2020   Sacroiliitis ([place]) 06/18/2020   Status post total knee replacement using cement, left 04/13/2020   Esophageal dysphagia 07/04/2019   Familial hypercholesterolemia 07/04/2019   Gastroesophageal reflux disease 07/04/2019   Obesity (BMI 30.0-34.9) 03/13/2018   Primary osteoarthritis of left knee 03/13/2018   Hyperlipidemia, mixed 04/26/2017   Chest  pain 04/24/2017   Essential hypertension 01/06/2016    Allergies  Allergen Reactions   Lisinopril Other (See Comments)    Chest pain, dizziness, cough   Codeine Hives    "hair crawling"    Hydrochlorothiazide Anxiety    Jittery feeling     Past Surgical History:  Procedure Laterality Date   BACK SURGERY  2014   CYST REMOVAL   BREAST BIOPSY Bilateral    neg   DILATION AND CURETTAGE OF UTERUS     TOTAL KNEE ARTHROPLASTY Left 04/13/2020   Procedure: TOTAL KNEE ARTHROPLASTY;  Surgeon: Corky Mull, MD;  Location: [facility] ORS;  Service: Orthopedics;  Laterality: Left;   TUBAL LIGATION      Social History   Tobacco Use   Smoking status: Never   Smokeless tobacco: Never  Vaping Use   Vaping Use: Never used  Substance Use Topics   Alcohol use: No    Alcohol/week: 0.0 standard drinks of alcohol   Drug use: No     Medication list has been reviewed and updated.  Current Meds  Medication Sig   amLODipine (NORVASC) 5 MG tablet Take 1 tablet (5 mg total) by mouth daily.   Calcium Carbonate-Vit D-Min (CALCIUM 1200 PO) Take 1 tablet by mouth daily.    ibuprofen (ADVIL) 600 MG tablet Take 1 tablet (600 mg total) by mouth every 6 (six) hours as needed.   Lactobacillus-Inulin (CULTURELLE DIGESTIVE HEALTH PO) Take 1 capsule by mouth daily.   metoprolol succinate (TOPROL-XL) 25 MG 24 hr tablet Take 1 tablet (25 mg total) by mouth daily.   Multiple Vitamins-Minerals (ONE-A-DAY WOMENS 50 PLUS PO) Take 1 tablet by mouth daily.   nitroGLYCERIN (NITROSTAT) 0.4 MG SL tablet Place 1 tablet (0.4 mg total) under the tongue every 5 (five) minutes as needed for chest pain.   OVER THE COUNTER MEDICATION Take 2 each by mouth daily. Neuriva   pantoprazole (PROTONIX) 40 MG tablet Take 1 tablet (40 mg total) by mouth daily.   Vitamin D, Cholecalciferol, 25 MCG (1000 UT) TABS Take 1,000 Units by mouth daily.    [DISCONTINUED] diclofenac (VOLTAREN) 75 MG EC tablet Take 1 tablet (75 mg total) by mouth  2 (two) times daily with a meal.       06/26/2022    2:18 PM 02/03/2022    9:18 AM 08/03/2021    7:57 AM 07/28/2020    8:30 AM  GAD 7 : Generalized Anxiety Score  Nervous, Anxious, on Edge 0 0 1 0  Control/stop worrying 0 0 1 0  Worry too much - different things 0 0 1 0  Trouble relaxing 0 0 2 0  Restless 0 0 0 0  Easily annoyed or irritable 0 0 3 0  Afraid - awful might happen 0 0 1 0  Total GAD 7 Score 0 0 9 0  Anxiety Difficulty Not difficult at all Not difficult at all Somewhat difficult        06/26/2022    2:18 PM 02/03/2022    9:18 AM 08/03/2021    7:57 AM  Depression screen PHQ  2/9  Decreased Interest 0 0 0  Down, Depressed, Hopeless 0 0 0  PHQ - 2 Score 0 0 0  Altered sleeping 0 0 0  Tired, decreased energy 0 0 0  Change in appetite 0 0 0  Feeling bad or failure about yourself  0 0 0  Trouble concentrating 0 0 0  Moving slowly or fidgety/restless 0 0 0  Suicidal thoughts 0 0 0  PHQ-9 Score 0 0 0  Difficult doing work/chores Not difficult at all Not difficult at all Not difficult at all    BP Readings from Last 3 Encounters:  06/26/22 (!) 130/90  06/19/22 (!) 167/83  05/29/22 (!) 160/79    Physical Exam Vitals and nursing note reviewed. Exam conducted with a chaperone present.  Constitutional:      General: She is not in acute distress.    Appearance: She is not diaphoretic.  HENT:     Head: Normocephalic and atraumatic.     Jaw: There is normal jaw occlusion.     Right Ear: Tympanic membrane, ear canal and external ear normal. There is no impacted cerumen.     Left Ear: Tympanic membrane, ear canal and external ear normal. There is no impacted cerumen.     Nose: Nose normal. No congestion or rhinorrhea.     Mouth/Throat:     Mouth: Mucous membranes are moist.     Tongue: No lesions.     Palate: No mass.     Pharynx: No oropharyngeal exudate or posterior oropharyngeal erythema.  Eyes:     General:        Right eye: No discharge.        Left eye: No  discharge.     Conjunctiva/sclera: Conjunctivae normal.     Pupils: Pupils are equal, round, and reactive to light.  Neck:     Thyroid: No thyromegaly.     Vascular: No JVD.  Cardiovascular:     Rate and Rhythm: Normal rate and regular rhythm.     Heart sounds: Normal heart sounds. No murmur heard.    No friction rub. No gallop.  Pulmonary:     Effort: Pulmonary effort is normal.     Breath sounds: Normal breath sounds. No wheezing, rhonchi or rales.  Abdominal:     General: Bowel sounds are normal.     Palpations: Abdomen is soft. There is no mass.     Tenderness: There is no abdominal tenderness. There is no guarding.  Musculoskeletal:        General: Normal range of motion.     Left wrist: No swelling or tenderness. Normal range of motion.     Cervical back: Normal range of motion and neck supple.  Lymphadenopathy:     Cervical: No cervical adenopathy.  Skin:    General: Skin is warm and dry.     Coloration: Skin is not pale.  Neurological:     Mental Status: She is alert.     Cranial Nerves: No cranial nerve deficit.     Sensory: No sensory deficit.     Motor: No weakness.     Coordination: Coordination normal.     Gait: Gait normal.     Deep Tendon Reflexes: Reflexes are normal and symmetric. Reflexes normal.     Wt Readings from Last 3 Encounters:  06/26/22 208 lb (94.3 kg)  05/29/22 205 lb 6.4 oz (93.2 kg)  02/13/22 198 lb (89.8 kg)    BP (!) 130/90 (BP Location: Right Arm,  Cuff Size: Large)   Pulse 60   Ht '5\' 5"'$  (1.651 m)   Wt 208 lb (94.3 kg)   SpO2 98%   BMI 34.61 kg/m   Assessment and Plan: 1. Facial injury, subsequent encounter New onset.  Gradually improving.  Stable.  Evaluation of potential entry is normal but no palpable tenderness of her orbit or zygomatic processes.  Will continue to observe and return to clinic as needed.  2. Sprain of left wrist, subsequent encounter New onset.  Improved.  No palpable tenderness with full range of  motion.  3. Essential hypertension Blood pressure remains elevated with recent weight gain.  We will increase Toprol XL to 50 but patient may double up on her 25 XL initially and will recheck blood pressure in 4 to 6 weeks. - metoprolol succinate (TOPROL-XL) 50 MG 24 hr tablet; Take 1 tablet (50 mg total) by mouth daily. Take with or immediately following a meal.  Dispense: 30 tablet; Refill: 1    Otilio Miu, MD

## 2022-06-27 ENCOUNTER — Telehealth: Payer: Self-pay

## 2022-06-27 NOTE — Telephone Encounter (Signed)
        Patient  visited [facility] 2/26    Telephone encounter attempt :  1st  A HIPAA compliant voice message was left requesting a return call.  Instructed patient to call back     [facility] [phone] [address], [place], [place] [address] Phone: [phone] Email: [email or web address]

## 2022-06-28 ENCOUNTER — Telehealth: Payer: Self-pay

## 2022-06-28 NOTE — Telephone Encounter (Signed)
        Patient  visited [place] on 2/26     Telephone encounter attempt :  2nd  A HIPAA compliant voice message was left requesting a return call.  Instructed patient to call back .   [facility] [phone] [address], [place], [place] [address] Phone: [phone] Email: [email or web address]

## 2022-08-03 ENCOUNTER — Ambulatory Visit (INDEPENDENT_AMBULATORY_CARE_PROVIDER_SITE_OTHER): Payer: Medicare HMO

## 2022-08-03 VITALS — Ht 65.0 in | Wt 208.0 lb

## 2022-08-03 DIAGNOSIS — Z0000 Encounter for general adult medical examination without abnormal findings: Secondary | ICD-10-CM

## 2022-08-03 DIAGNOSIS — Z1231 Encounter for screening mammogram for malignant neoplasm of breast: Secondary | ICD-10-CM

## 2022-08-03 NOTE — Patient Instructions (Signed)
[name] , Thank you for taking time to come for your Medicare Wellness Visit. I appreciate your ongoing commitment to your health goals. Please review the following plan we discussed and let me know if I can assist you in the future.   These are the goals we discussed:  Goals      DIET - EAT MORE FRUITS AND VEGETABLES     DIET - REDUCE PORTION SIZE     Weight (lb) < 175 lb (79.4 kg)     Pt would like to lose weight over the next year with diet and exercise.         This is a list of the screening recommended for you and due dates:  Health Maintenance  Topic Date Due   COVID-19 Vaccine (6 - 2023-24 season) 12/23/2021   Hepatitis C Screening: USPSTF Recommendation to screen - Ages 18-79 yo.  08/04/2022*   Zoster (Shingles) Vaccine (1 of 2) 09/26/2022*   Flu Shot  11/23/2022   Mammogram  02/03/2023   Medicare Annual Wellness Visit  08/03/2023   Colon Cancer Screening  07/30/2027   DTaP/Tdap/Td vaccine (2 - Td or Tdap) 12/05/2028   Pneumonia Vaccine  Completed   DEXA scan (bone density measurement)  Completed   HPV Vaccine  Aged Out  *Topic was postponed. The date shown is not the original due date.    Advanced directives: no  Conditions/risks identified: none  Next appointment: Follow up in one year for your annual wellness visit 08/09/23 @ 11:30 am by phone   Preventive Care 65 Years and Older, Female Preventive care refers to lifestyle choices and visits with your health care provider that can promote health and wellness. What does preventive care include? A yearly physical exam. This is also called an annual well check. Dental exams once or twice a year. Routine eye exams. Ask your health care provider how often you should have your eyes checked. Personal lifestyle choices, including: Daily care of your teeth and gums. Regular physical activity. Eating a healthy diet. Avoiding tobacco and drug use. Limiting alcohol use. Practicing safe sex. Taking low-dose  aspirin every day. Taking vitamin and mineral supplements as recommended by your health care provider. What happens during an annual well check? The services and screenings done by your health care provider during your annual well check will depend on your age, overall health, lifestyle risk factors, and family history of disease. Counseling  Your health care provider may ask you questions about your: Alcohol use. Tobacco use. Drug use. Emotional well-being. Home and relationship well-being. Sexual activity. Eating habits. History of falls. Memory and ability to understand (cognition). Work and work Astronomer. Reproductive health. Screening  You may have the following tests or measurements: Height, weight, and BMI. Blood pressure. Lipid and cholesterol levels. These may be checked every 5 years, or more frequently if you are over 57 years old. Skin check. Lung cancer screening. You may have this screening every year starting at age 19 if you have a 30-pack-year history of smoking and currently smoke or have quit within the past 15 years. Fecal occult blood test (FOBT) of the stool. You may have this test every year starting at age 54. Flexible sigmoidoscopy or colonoscopy. You may have a sigmoidoscopy every 5 years or a colonoscopy every 10 years starting at age 73. Hepatitis C blood test. Hepatitis B blood test. Sexually transmitted disease (STD) testing. Diabetes screening. This is done by checking your blood sugar (glucose) after you have not  eaten for a while (fasting). You may have this done every 1-3 years. Bone density scan. This is done to screen for osteoporosis. You may have this done starting at age 26. Mammogram. This may be done every 1-2 years. Talk to your health care provider about how often you should have regular mammograms. Talk with your health care provider about your test results, treatment options, and if necessary, the need for more tests. Vaccines  Your  health care provider may recommend certain vaccines, such as: Influenza vaccine. This is recommended every year. Tetanus, diphtheria, and acellular pertussis (Tdap, Td) vaccine. You may need a Td booster every 10 years. Zoster vaccine. You may need this after age 17. Pneumococcal 13-valent conjugate (PCV13) vaccine. One dose is recommended after age 64. Pneumococcal polysaccharide (PPSV23) vaccine. One dose is recommended after age 68. Talk to your health care provider about which screenings and vaccines you need and how often you need them. This information is not intended to replace advice given to you by your health care provider. Make sure you discuss any questions you have with your health care provider. Document Released: 05/07/2015 Document Revised: 12/29/2015 Document Reviewed: 02/09/2015 Elsevier Interactive Patient Education  2017 [place] Prevention in the Home Falls can cause injuries. They can happen to people of all ages. There are many things you can do to make your home safe and to help prevent falls. What can I do on the outside of my home? Regularly fix the edges of walkways and driveways and fix any cracks. Remove anything that might make you trip as you walk through a door, such as a raised step or threshold. Trim any bushes or trees on the path to your home. Use bright outdoor lighting. Clear any walking paths of anything that might make someone trip, such as rocks or tools. Regularly check to see if handrails are loose or broken. Make sure that both sides of any steps have handrails. Any raised decks and porches should have guardrails on the edges. Have any leaves, snow, or ice cleared regularly. Use sand or salt on walking paths during winter. Clean up any spills in your garage right away. This includes oil or grease spills. What can I do in the bathroom? Use night lights. Install grab bars by the toilet and in the tub and shower. Do not use towel bars as  grab bars. Use non-skid mats or decals in the tub or shower. If you need to sit down in the shower, use a plastic, non-slip stool. Keep the floor dry. Clean up any water that spills on the floor as soon as it happens. Remove soap buildup in the tub or shower regularly. Attach bath mats securely with double-sided non-slip rug tape. Do not have throw rugs and other things on the floor that can make you trip. What can I do in the bedroom? Use night lights. Make sure that you have a light by your bed that is easy to reach. Do not use any sheets or blankets that are too big for your bed. They should not hang down onto the floor. Have a firm chair that has side arms. You can use this for support while you get dressed. Do not have throw rugs and other things on the floor that can make you trip. What can I do in the kitchen? Clean up any spills right away. Avoid walking on wet floors. Keep items that you use a lot in easy-to-reach places. If you need to reach  something above you, use a strong step stool that has a grab bar. Keep electrical cords out of the way. Do not use floor polish or wax that makes floors slippery. If you must use wax, use non-skid floor wax. Do not have throw rugs and other things on the floor that can make you trip. What can I do with my stairs? Do not leave any items on the stairs. Make sure that there are handrails on both sides of the stairs and use them. Fix handrails that are broken or loose. Make sure that handrails are as long as the stairways. Check any carpeting to make sure that it is firmly attached to the stairs. Fix any carpet that is loose or worn. Avoid having throw rugs at the top or bottom of the stairs. If you do have throw rugs, attach them to the floor with carpet tape. Make sure that you have a light switch at the top of the stairs and the bottom of the stairs. If you do not have them, ask someone to add them for you. What else can I do to help prevent  falls? Wear shoes that: Do not have high heels. Have rubber bottoms. Are comfortable and fit you well. Are closed at the toe. Do not wear sandals. If you use a stepladder: Make sure that it is fully opened. Do not climb a closed stepladder. Make sure that both sides of the stepladder are locked into place. Ask someone to hold it for you, if possible. Clearly mark and make sure that you can see: Any grab bars or handrails. First and last steps. Where the edge of each step is. Use tools that help you move around (mobility aids) if they are needed. These include: Canes. Walkers. Scooters. Crutches. Turn on the lights when you go into a dark area. Replace any light bulbs as soon as they burn out. Set up your furniture so you have a clear path. Avoid moving your furniture around. If any of your floors are uneven, fix them. If there are any pets around you, be aware of where they are. Review your medicines with your doctor. Some medicines can make you feel dizzy. This can increase your chance of falling. Ask your doctor what other things that you can do to help prevent falls. This information is not intended to replace advice given to you by your health care provider. Make sure you discuss any questions you have with your health care provider. Document Released: 02/04/2009 Document Revised: 09/16/2015 Document Reviewed: 05/15/2014 Elsevier Interactive Patient Education  2017 [employer].

## 2022-08-03 NOTE — Progress Notes (Signed)
I connected with  Lauren Leblanc on 08/03/22 by a audio enabled telemedicine application and verified that I am speaking with the correct person using two identifiers.  Patient Location: Home  Provider Location: Office/Clinic  I discussed the limitations of evaluation and management by telemedicine. The patient expressed understanding and agreed to proceed.  Subjective:   Lauren Leblanc is a 72 y.o. female who presents for Medicare Annual (Subsequent) preventive examination.  Review of Systems     Cardiac Risk Factors include: advanced age (>64men, >37 women);hypertension     Objective:    There were no vitals filed for this visit. There is no height or weight on file to calculate BMI.     08/03/2022   11:06 AM 06/19/2022    5:37 PM 02/13/2022   11:18 AM 08/13/2021    6:42 PM 07/25/2021    9:41 AM 07/21/2020    9:33 AM 04/13/2020    6:10 AM  Advanced Directives  Does Patient Have a Medical Advance Directive? No No No No No No No  Would patient like information on creating a medical advance directive? No - Patient declined    Yes (MAU/Ambulatory/Procedural Areas - Information given) Yes (MAU/Ambulatory/Procedural Areas - Information given) No - Patient declined    Current Medications (verified) Outpatient Encounter Medications as of 08/03/2022  Medication Sig   amLODipine (NORVASC) 5 MG tablet Take 1 tablet (5 mg total) by mouth daily.   Calcium Carbonate-Vit D-Min (CALCIUM 1200 PO) Take 1 tablet by mouth daily.    ibuprofen (ADVIL) 600 MG tablet Take 1 tablet (600 mg total) by mouth every 6 (six) hours as needed.   Lactobacillus-Inulin (CULTURELLE DIGESTIVE HEALTH PO) Take 1 capsule by mouth daily.   metoprolol succinate (TOPROL-XL) 50 MG 24 hr tablet Take 1 tablet (50 mg total) by mouth daily. Take with or immediately following a meal.   Multiple Vitamins-Minerals (ONE-A-DAY WOMENS 50 PLUS PO) Take 1 tablet by mouth daily.   nitroGLYCERIN (NITROSTAT) 0.4 MG SL  tablet Place 1 tablet (0.4 mg total) under the tongue every 5 (five) minutes as needed for chest pain.   OVER THE COUNTER MEDICATION Take 2 each by mouth daily. Neuriva   pantoprazole (PROTONIX) 40 MG tablet Take 1 tablet (40 mg total) by mouth daily.   Vitamin D, Cholecalciferol, 25 MCG (1000 UT) TABS Take 1,000 Units by mouth daily.    No facility-administered encounter medications on file as of 08/03/2022.    Allergies (verified) Lisinopril, Codeine, and Hydrochlorothiazide   History: Past Medical History:  Diagnosis Date   Angina at rest    Arthritis    GERD (gastroesophageal reflux disease)    Hypertension    Past Surgical History:  Procedure Laterality Date   BACK SURGERY  2014   CYST REMOVAL   BREAST BIOPSY Bilateral    neg   DILATION AND CURETTAGE OF UTERUS     TOTAL KNEE ARTHROPLASTY Left 04/13/2020   Procedure: TOTAL KNEE ARTHROPLASTY;  Surgeon: Christena Flake, MD;  Location: [facility] ORS;  Service: Orthopedics;  Laterality: Left;   TUBAL LIGATION     Family History  Problem Relation Age of Onset   Coronary artery disease Mother 19   Hypertension Mother    Stroke Father    Diabetes Father    Hypertension Sister    Allergies Sister    Breast cancer Maternal Aunt        3 mat aunts   Breast cancer Maternal Aunt    Breast cancer  Maternal Aunt    Social History   Socioeconomic History   Marital status: Married    Spouse name: Not on file   Number of children: Not on file   Years of education: Not on file   Highest education level: Not on file  Occupational History   Occupation: working - Psychologist, sport and exercisebusiness owner    Comment: [employer]  Tobacco Use   Smoking status: Never   Smokeless tobacco: Never  Vaping Use   Vaping Use: Never used  Substance and Sexual Activity   Alcohol use: No    Alcohol/week: 0.0 standard drinks of alcohol   Drug use: No   Sexual activity: Not Currently  Other Topics Concern   Not on file  Social History Narrative   Not on file    Social Determinants of Health   Financial Resource Strain: Low Risk  (08/03/2022)   Overall Financial Resource Strain (CARDIA)    Difficulty of Paying Living Expenses: Not hard at all  Food Insecurity: No Food Insecurity (08/03/2022)   Hunger Vital Sign    Worried About Running Out of Food in the Last Year: Never true    Ran Out of Food in the Last Year: Never true  Transportation Needs: No Transportation Needs (08/03/2022)   PRAPARE - Administrator, Civil ServiceTransportation    Lack of Transportation (Medical): No    Lack of Transportation (Non-Medical): No  Physical Activity: Sufficiently Active (08/03/2022)   Exercise Vital Sign    Days of Exercise per Week: 7 days    Minutes of Exercise per Session: 60 min  Stress: No Stress Concern Present (08/03/2022)   [employer] - Occupational Stress Questionnaire    Feeling of Stress : Only a little  Social Connections: Moderately Isolated (08/03/2022)   Social Connection and Isolation Panel [NHANES]    Frequency of Communication with Friends and Family: More than three times a week    Frequency of Social Gatherings with Friends and Family: Once a week    Attends Religious Services: Never    Database administratorActive Member of Clubs or Organizations: No    Attends Engineer, structuralClub or Organization Meetings: Never    Marital Status: Married    Tobacco Counseling Counseling given: Not Answered   Clinical Intake:  Pre-visit preparation completed: Yes  Pain : No/denies pain     Nutritional Risks: None Diabetes: No  How often do you need to have someone help you when you read instructions, pamphlets, or other written materials from your doctor or pharmacy?: 1 - Never  Diabetic?no  Interpreter Needed?: No  Information entered by :: Kennedy BuckerLorrie Birdia Jaycox, LPN   Activities of Daily Living    08/03/2022   11:09 AM  In your present state of health, do you have any difficulty performing the following activities:  Hearing? 0  Vision? 0  Difficulty concentrating or  making decisions? 0  Walking or climbing stairs? 0  Dressing or bathing? 0  Doing errands, shopping? 0  Preparing Food and eating ? N  Using the Toilet? N  In the past six months, have you accidently leaked urine? N  Do you have problems with loss of bowel control? N  Managing your Medications? N  Managing your Finances? N  Housekeeping or managing your Housekeeping? N    Patient Care Team: Duanne LimerickJones, Deanna C, MD as PCP - General (Family Medicine)  Indicate any recent Medical Services you may have received from other than [facility] providers in the past year (date may be approximate).  Assessment:   This is a routine wellness examination for Deriah.  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 1 1 0 1 1  Comment     once before knee surgery - tripped over water hose  Number falls in past yr: 1 1 0 1 0  Injury with Fall? 1 1 0 0 0  Risk for fall due to : History of fall(s) History of fall(s) No Fall Risks History of fall(s) No Fall Risks  Risk for fall due to: Comment  tripped on curb     Follow up Falls evaluation completed;Falls prevention  discussed Falls evaluation completed;Falls prevention discussed Falls evaluation completed Falls prevention discussed Falls prevention discussed    FALL RISK PREVENTION PERTAINING TO THE HOME:  Any stairs in or around the home? Yes  If so, are there any without handrails? No  Home free of loose throw rugs in walkways, pet beds, electrical cords, etc? Yes  Adequate lighting in your home to reduce risk of falls? Yes   ASSISTIVE DEVICES UTILIZED TO PREVENT FALLS:  Life alert? No  Use of a cane, walker or w/c? No  Grab bars in the bathroom? No  Shower chair or bench in shower? No  Elevated toilet seat or a handicapped toilet? Yes    Cognitive Function:        08/03/2022   11:14 AM 07/16/2019    9:43 AM  6CIT Screen  What Year? 0 points 0 points  What month? 0 points 0 points  What time? 0 points 0 points  Count back from 20 0 points 0 points  Months in reverse 0 points 0 points  Repeat phrase 0 points 0 points  Total Score 0 points 0 points    Immunizations Immunization History  Administered Date(s) Administered   Fluad Quad(high Dose 65+) 02/02/2021, 12/29/2021   Influenza, High Dose Seasonal PF 02/01/2018   Influenza, Seasonal, Injecte, Preservative Fre 05/10/2012   Influenza,inj,Quad PF,6+ Mos 02/01/2017   Influenza-Unspecified 02/06/2020   PFIZER(Purple Top)SARS-COV-2 Vaccination 06/15/2019, 07/09/2019, 01/22/2020, 10/06/2020, 04/04/2021   PNEUMOCOCCAL CONJUGATE-20 02/03/2022   Pneumococcal Conjugate-13 02/01/2018   Pneumococcal Polysaccharide-23 07/21/2020   Rabies, IM 05/29/2022   Tdap 12/06/2018    TDAP status: Up to date  Flu Vaccine status: Up to date  Pneumococcal vaccine status: Up to date  Covid-19 vaccine  status: Completed vaccines  Qualifies for Shingles Vaccine? Yes   Zostavax completed No   Shingrix Completed?: No.    Education has been provided regarding the importance of this vaccine. Patient has been advised to call insurance company to  determine out of pocket expense if they have not yet received this vaccine. Advised may also receive vaccine at local pharmacy or Health Dept. Verbalized acceptance and understanding.  Screening Tests Health Maintenance  Topic Date Due   COVID-19 Vaccine (6 - 2023-24 season) 12/23/2021   Hepatitis C Screening  08/04/2022 (Originally 07/18/1968)   Zoster Vaccines- Shingrix (1 of 2) 09/26/2022 (Originally 07/18/1969)   INFLUENZA VACCINE  11/23/2022   MAMMOGRAM  02/03/2023   Medicare Annual Wellness (AWV)  08/03/2023   COLONOSCOPY (Pts 45-88yrs Insurance coverage will need to be confirmed)  07/30/2027   DTaP/Tdap/Td (2 - Td or Tdap) 12/05/2028   Pneumonia Vaccine 72+ Years old  Completed   DEXA SCAN  Completed   HPV VACCINES  Aged Out    Health Maintenance  Health Maintenance Due  Topic Date Due   COVID-19 Vaccine (6 - 2023-24 season) 12/23/2021    Colorectal cancer screening: Type of screening: Colonoscopy. Completed 07/29/17. Repeat every 10 years  Mammogram status: Ordered 08/03/22. Pt provided with contact info and advised to call to schedule appt.   Bone Density status: Completed 09/02/19. Results reflect: Bone density results: OSTEOPENIA. Repeat every 5 years.  Lung Cancer Screening: (Low Dose CT Chest recommended if Age 28-80 years, 30 pack-year currently smoking OR have quit w/in 15years.) does not qualify.   Additional Screening:  Hepatitis C Screening: does qualify; Completed no  Vision Screening: Recommended annual ophthalmology exams for early detection of glaucoma and other disorders of the eye. Is the patient up to date with their annual eye exam?  Yes  Who is the provider or what is the name of the office in which the patient attends annual eye exams? Dr. Clearance Coots If pt is not established with a provider, would they like to be referred to a provider to establish care? No .   Dental Screening: Recommended annual dental exams for proper oral hygiene  Community Resource  Referral / Chronic Care Management: CRR required this visit?  No   CCM required this visit?  No      Plan:     I have personally reviewed and noted the following in the patient's chart:   Medical and social history Use of alcohol, tobacco or illicit drugs  Current medications and supplements including opioid prescriptions. Patient is not currently taking opioid prescriptions. Functional ability and status Nutritional status Physical activity Advanced directives List of other physicians Hospitalizations, surgeries, and ER visits in previous 12 months Vitals Screenings to include cognitive, depression, and falls Referrals and appointments  In addition, I have reviewed and discussed with patient certain preventive protocols, quality metrics, and best practice recommendations. A written personalized care plan for preventive services as well as general preventive health recommendations were provided to patient.     Hal Hope, LPN   9/[id number]   Nurse Notes: none

## 2022-08-04 ENCOUNTER — Ambulatory Visit (INDEPENDENT_AMBULATORY_CARE_PROVIDER_SITE_OTHER): Payer: Medicare HMO | Admitting: Family Medicine

## 2022-08-04 ENCOUNTER — Encounter: Payer: Self-pay | Admitting: Family Medicine

## 2022-08-04 VITALS — BP 122/76 | HR 68 | Ht 65.0 in | Wt 206.0 lb

## 2022-08-04 DIAGNOSIS — E7801 Familial hypercholesterolemia: Secondary | ICD-10-CM | POA: Diagnosis not present

## 2022-08-04 DIAGNOSIS — I10 Essential (primary) hypertension: Secondary | ICD-10-CM | POA: Diagnosis not present

## 2022-08-04 DIAGNOSIS — K219 Gastro-esophageal reflux disease without esophagitis: Secondary | ICD-10-CM

## 2022-08-04 MED ORDER — METOPROLOL SUCCINATE ER 50 MG PO TB24: 50.0000 mg | ORAL_TABLET | Freq: Every day | ORAL | 1 refills | Status: DC

## 2022-08-04 MED ORDER — AMLODIPINE BESYLATE 5 MG PO TABS: 5.0000 mg | ORAL_TABLET | Freq: Every day | ORAL | 1 refills | Status: DC

## 2022-08-04 MED ORDER — PANTOPRAZOLE SODIUM 40 MG PO TBEC: 40.0000 mg | DELAYED_RELEASE_TABLET | Freq: Every day | ORAL | 1 refills | Status: DC

## 2022-08-04 NOTE — Progress Notes (Signed)
Date:  08/04/2022   Name:  Lauren Leblanc   DOB:  1951-01-25   MRN:  [id number]   Chief Complaint: Hypertension and Gastroesophageal Reflux  Hypertension This is a chronic problem. The current episode started more than 1 year ago. The problem has been gradually improving since onset. The problem is controlled. Pertinent negatives include no anxiety, blurred vision, chest pain, headaches, orthopnea, palpitations, PND, shortness of breath or sweats. There are no associated agents to hypertension. Risk factors for coronary artery disease include dyslipidemia. Past treatments include calcium channel blockers and beta blockers. The current treatment provides moderate improvement. There is no history of CAD/MI or CVA. There is no history of chronic renal disease, a hypertension causing med or renovascular disease.  Gastroesophageal Reflux She complains of heartburn. She reports no abdominal pain, no chest pain, no coughing, no dysphagia or no nausea. pending diet. This is a chronic problem. The current episode started more than 1 year ago. The problem has been gradually improving. The symptoms are aggravated by certain foods. She has tried a PPI for the symptoms. The treatment provided moderate relief.  Hyperlipidemia This is a chronic problem. The current episode started more than 1 year ago. Recent lipid tests were reviewed and are variable. She has no history of chronic renal disease. Pertinent negatives include no chest pain or shortness of breath. Current antihyperlipidemic treatment includes diet change. The current treatment provides moderate improvement of lipids.    Lab Results  Component Value Date   NA 143 12/27/2021   K 3.8 12/27/2021   CO2 24 12/27/2021   GLUCOSE 95 12/27/2021   BUN 21 12/27/2021   CREATININE 0.85 12/27/2021   CALCIUM 9.4 12/27/2021   EGFR 68 08/03/2021   GFRNONAA >60 12/27/2021   Lab Results  Component Value Date   CHOL 219 (H) 08/03/2021   HDL 54  08/03/2021   LDLCALC 142 (H) 08/03/2021   LDLDIRECT 122 (H) 02/03/2022   TRIG 131 08/03/2021   CHOLHDL 5.3 04/25/2017   Lab Results  Component Value Date   TSH 1.359 12/11/2018   No results found for: "HGBA1C" Lab Results  Component Value Date   WBC 5.5 12/27/2021   HGB 12.6 12/27/2021   HCT 38.7 12/27/2021   MCV 85.8 12/27/2021   PLT 268 12/27/2021   Lab Results  Component Value Date   ALT 13 04/02/2020   AST 18 04/02/2020   ALKPHOS 62 04/02/2020   BILITOT 0.7 04/02/2020   No results found for: "25OHVITD2", "25OHVITD3", "VD25OH"   Review of Systems  HENT:  Negative for trouble swallowing.   Eyes:  Negative for blurred vision and visual disturbance.  Respiratory:  Negative for cough and shortness of breath.   Cardiovascular:  Positive for leg swelling. Negative for chest pain, palpitations, orthopnea and PND.  Gastrointestinal:  Positive for heartburn. Negative for abdominal pain, blood in stool, constipation, dysphagia and nausea.  Endocrine: Negative for polydipsia and polyuria.  Genitourinary:  Negative for dysuria and frequency.  Musculoskeletal:  Negative for back pain.  Neurological:  Negative for headaches.    Patient Active Problem List   Diagnosis Date Noted   Statin intolerance 02/06/2022   Spondylolisthesis of lumbar region 06/18/2020   Sacroiliitis 06/18/2020   Status post total knee replacement using cement, left 04/13/2020   Esophageal dysphagia 07/04/2019   Familial hypercholesterolemia 07/04/2019   Gastroesophageal reflux disease 07/04/2019   Obesity (BMI 30.0-34.9) 03/13/2018   Primary osteoarthritis of left knee 03/13/2018   Hyperlipidemia, mixed  04/26/2017   Chest pain 04/24/2017   Essential hypertension 01/06/2016    Allergies  Allergen Reactions   Lisinopril Other (See Comments)    Chest pain, dizziness, cough   Codeine Hives    "hair crawling"    Hydrochlorothiazide Anxiety    Jittery feeling     Past Surgical History:   Procedure Laterality Date   BACK SURGERY  2014   CYST REMOVAL   BREAST BIOPSY Bilateral    neg   DILATION AND CURETTAGE OF UTERUS     TOTAL KNEE ARTHROPLASTY Left 04/13/2020   Procedure: TOTAL KNEE ARTHROPLASTY;  Surgeon: Christena Flake, MD;  Location: [facility] ORS;  Service: Orthopedics;  Laterality: Left;   TUBAL LIGATION      Social History   Tobacco Use   Smoking status: Never   Smokeless tobacco: Never  Vaping Use   Vaping Use: Never used  Substance Use Topics   Alcohol use: No    Alcohol/week: 0.0 standard drinks of alcohol   Drug use: No     Medication list has been reviewed and updated.  Current Meds  Medication Sig   amLODipine (NORVASC) 5 MG tablet Take 1 tablet (5 mg total) by mouth daily.   Calcium Carbonate-Vit D-Min (CALCIUM 1200 PO) Take 1 tablet by mouth daily.    ibuprofen (ADVIL) 600 MG tablet Take 1 tablet (600 mg total) by mouth every 6 (six) hours as needed.   Lactobacillus-Inulin (CULTURELLE DIGESTIVE HEALTH PO) Take 1 capsule by mouth daily.   metoprolol succinate (TOPROL-XL) 50 MG 24 hr tablet Take 1 tablet (50 mg total) by mouth daily. Take with or immediately following a meal.   Multiple Vitamins-Minerals (ONE-A-DAY WOMENS 50 PLUS PO) Take 1 tablet by mouth daily.   nitroGLYCERIN (NITROSTAT) 0.4 MG SL tablet Place 1 tablet (0.4 mg total) under the tongue every 5 (five) minutes as needed for chest pain.   OVER THE COUNTER MEDICATION Take 2 each by mouth daily. Neuriva   pantoprazole (PROTONIX) 40 MG tablet Take 1 tablet (40 mg total) by mouth daily.   Vitamin D, Cholecalciferol, 25 MCG (1000 UT) TABS Take 1,000 Units by mouth daily.        08/04/2022    1:10 PM 06/26/2022    2:18 PM 02/03/2022    9:18 AM 08/03/2021    7:57 AM  GAD 7 : Generalized Anxiety Score  Nervous, Anxious, on Edge 0 0 0 1  Control/stop worrying 0 0 0 1  Worry too much - different things 0 0 0 1  Trouble relaxing 0 0 0 2  Restless 0 0 0 0  Easily annoyed or irritable 0 0 0 3   Afraid - awful might happen 0 0 0 1  Total GAD 7 Score 0 0 0 9  Anxiety Difficulty Not difficult at all Not difficult at all Not difficult at all Somewhat difficult       08/04/2022    1:10 PM 08/03/2022   11:03 AM 06/26/2022    2:18 PM  Depression screen PHQ 2/9  Decreased Interest 0 0 0  Down, Depressed, Hopeless 0 1 0  PHQ - 2 Score 0 1 0  Altered sleeping 0 0 0  Tired, decreased energy 0 0 0  Change in appetite 0 0 0  Feeling bad or failure about yourself  0 0 0  Trouble concentrating 0 0 0  Moving slowly or fidgety/restless 0 0 0  Suicidal thoughts 0 0 0  PHQ-9 Score 0 1 0  Difficult doing work/chores Not difficult at all Not difficult at all Not difficult at all    BP Readings from Last 3 Encounters:  08/04/22 122/76  06/26/22 (!) 130/90  06/19/22 (!) 167/83    Physical Exam Vitals and nursing note reviewed. Exam conducted with a chaperone present.  Constitutional:      General: She is not in acute distress.    Appearance: Normal appearance. She is not diaphoretic.  HENT:     Head: Normocephalic and atraumatic.     Right Ear: External ear normal.     Left Ear: External ear normal.     Nose: Nose normal. No congestion or rhinorrhea.     Mouth/Throat:     Mouth: Mucous membranes are moist.     Pharynx: No oropharyngeal exudate or posterior oropharyngeal erythema.  Eyes:     General:        Right eye: No discharge.        Left eye: No discharge.     Conjunctiva/sclera: Conjunctivae normal.     Pupils: Pupils are equal, round, and reactive to light.  Neck:     Thyroid: No thyromegaly.     Vascular: No JVD.  Cardiovascular:     Rate and Rhythm: Normal rate and regular rhythm.     Heart sounds: Normal heart sounds. No murmur heard.    No friction rub. No gallop.  Pulmonary:     Effort: Pulmonary effort is normal. No respiratory distress.     Breath sounds: Normal breath sounds. No wheezing, rhonchi or rales.  Abdominal:     General: Bowel sounds are normal.  There is no distension.     Palpations: Abdomen is soft. There is no mass.     Tenderness: There is no abdominal tenderness. There is no guarding or rebound.     Hernia: No hernia is present.  Musculoskeletal:        General: Normal range of motion.     Cervical back: Normal range of motion and neck supple.  Lymphadenopathy:     Cervical: No cervical adenopathy.  Skin:    General: Skin is warm and dry.     Findings: No erythema or lesion.  Neurological:     Mental Status: She is alert.     Deep Tendon Reflexes: Reflexes are normal and symmetric.     Wt Readings from Last 3 Encounters:  08/04/22 206 lb (93.4 kg)  08/03/22 208 lb (94.3 kg)  06/26/22 208 lb (94.3 kg)    BP 122/76   Pulse 68   Ht  (1.651 m)   Wt 206 lb (93.4 kg)   SpO2 97%   BMI 34.28 kg/m   Assessment and Plan: 1. Essential hypertension Chronic.  Controlled.  Stable.  Blood pressure is 122/76.  Asymptomatic.  Tolerating medication well.  Continue amlodipine 5 mg once a day and metoprolol XL 50 mg once a day.  Will check CMP for electrolytes and GFR. - amLODipine (NORVASC) 5 MG tablet; Take 1 tablet (5 mg total) by mouth daily.  Dispense: 90 tablet; Refill: 1 - metoprolol succinate (TOPROL-XL) 50 MG 24 hr tablet; Take 1 tablet (50 mg total) by mouth daily. Take with or immediately following a meal.  Dispense: 90 tablet; Refill: 1 - Comprehensive Metabolic Panel (CMET)  2. Gastroesophageal reflux disease, unspecified whether esophagitis present Chronic.  Controlled.  Stable.  Continue pantoprazole 40 mg once a day. - pantoprazole (PROTONIX) 40 MG tablet; Take 1 tablet (40 mg total) by  mouth daily.  Dispense: 90 tablet; Refill: 1  3. Familial hypercholesterolemia Chronic.  Diet controlled.  Stable.  Patient is fasting and we will check lipid panel today and adjust accordingly in the meantime we will supply low-cholesterol low triglyceride guidelines. - Lipid Panel With LDL/HDL Ratio     Elizabeth Sauer,  MD

## 2022-08-04 NOTE — Patient Instructions (Signed)

## 2022-08-05 LAB — COMPREHENSIVE METABOLIC PANEL
ALT: 17 IU/L (ref 0–32)
AST: 21 IU/L (ref 0–40)
Albumin/Globulin Ratio: 2.1 (ref 1.2–2.2)
Albumin: 4.5 g/dL (ref 3.9–4.9)
Alkaline Phosphatase: 93 IU/L (ref 44–121)
BUN/Creatinine Ratio: 18 (ref 12–28)
BUN: 17 mg/dL (ref 8–27)
Bilirubin Total: 0.3 mg/dL (ref 0.0–1.2)
CO2: 24 mmol/L (ref 20–29)
Calcium: 9.7 mg/dL (ref 8.7–10.3)
Chloride: 103 mmol/L (ref 96–106)
Creatinine, Ser: 0.92 mg/dL (ref 0.57–1.00)
Globulin, Total: 2.1 g/dL (ref 1.5–4.5)
Glucose: 91 mg/dL (ref 70–99)
Potassium: 4.6 mmol/L (ref 3.5–5.2)
Sodium: 143 mmol/L (ref 134–144)
Total Protein: 6.6 g/dL (ref 6.0–8.5)
eGFR: 67 mL/min/{1.73_m2} (ref >59)

## 2022-08-05 LAB — LIPID PANEL WITH LDL/HDL RATIO
Cholesterol, Total: 207 mg/dL — ABNORMAL HIGH (ref 100–199)
HDL: 49 mg/dL (ref >39)
LDL Chol Calc (NIH): 124 mg/dL — ABNORMAL HIGH (ref 0–99)
LDL/HDL Ratio: 2.5 ratio (ref 0.0–3.2)
Triglycerides: 191 mg/dL — ABNORMAL HIGH (ref 0–149)
VLDL Cholesterol Cal: 34 mg/dL (ref 5–40)

## 2022-08-07 ENCOUNTER — Ambulatory Visit: Payer: Medicare HMO | Admitting: Family Medicine

## 2022-09-25 ENCOUNTER — Ambulatory Visit (INDEPENDENT_AMBULATORY_CARE_PROVIDER_SITE_OTHER): Payer: Medicare HMO | Admitting: Family Medicine

## 2022-09-25 ENCOUNTER — Encounter: Payer: Self-pay | Admitting: Family Medicine

## 2022-09-25 ENCOUNTER — Ambulatory Visit
Admission: RE | Admit: 2022-09-25 | Discharge: 2022-09-25 | Disposition: A | Discharge status: Home / Self Care | Payer: Medicare HMO | Source: Ambulatory Visit | Attending: Family Medicine | Admitting: Family Medicine

## 2022-09-25 ENCOUNTER — Ambulatory Visit
Admission: RE | Admit: 2022-09-25 | Discharge: 2022-09-25 | Disposition: A | Discharge status: Home / Self Care | Payer: Medicare HMO | Attending: Family Medicine | Admitting: Family Medicine

## 2022-09-25 VITALS — BP 118/76 | HR 62 | Ht 65.0 in | Wt 201.0 lb

## 2022-09-25 DIAGNOSIS — J40 Bronchitis, not specified as acute or chronic: Secondary | ICD-10-CM

## 2022-09-25 DIAGNOSIS — I10 Essential (primary) hypertension: Secondary | ICD-10-CM | POA: Diagnosis not present

## 2022-09-25 DIAGNOSIS — J4521 Mild intermittent asthma with (acute) exacerbation: Secondary | ICD-10-CM | POA: Diagnosis not present

## 2022-09-25 DIAGNOSIS — R0600 Dyspnea, unspecified: Secondary | ICD-10-CM | POA: Diagnosis not present

## 2022-09-25 DIAGNOSIS — E7801 Familial hypercholesterolemia: Secondary | ICD-10-CM | POA: Diagnosis not present

## 2022-09-25 MED ORDER — AZITHROMYCIN 250 MG PO TABS: ORAL_TABLET | ORAL | 0 refills | Status: AC

## 2022-09-25 MED ORDER — ATORVASTATIN CALCIUM 10 MG PO TABS: 10.0000 mg | ORAL_TABLET | Freq: Every day | ORAL | 3 refills | Status: DC

## 2022-09-25 MED ORDER — ALBUTEROL SULFATE HFA 108 (90 BASE) MCG/ACT IN AERS: 2.0000 | INHALATION_SPRAY | Freq: Four times a day (QID) | RESPIRATORY_TRACT | 2 refills | Status: DC | PRN

## 2022-09-25 MED ORDER — PREDNISONE 10 MG PO TABS: 10.0000 mg | ORAL_TABLET | Freq: Every day | ORAL | 0 refills | Status: DC

## 2022-09-25 NOTE — Progress Notes (Signed)
Date:  09/25/2022   Name:  Lauren Leblanc   DOB:  05-05-1950   MRN:  [id number]   Chief Complaint: Cough (Coughing with congestion, rattling, "tired- having to push myself" started a week ago- green production)  Cough This is a chronic problem. The current episode started more than 1 year ago. The problem has been gradually improving. The problem occurs every few hours. The cough is Non-productive. Associated symptoms include shortness of breath and wheezing. Pertinent negatives include no chest pain, chills, ear congestion, ear pain, fever, headaches, myalgias, postnasal drip, rash, rhinorrhea or sore throat. Nothing aggravates the symptoms. The treatment provided moderate relief.    Lab Results  Component Value Date   NA 143 08/04/2022   K 4.6 08/04/2022   CO2 24 08/04/2022   GLUCOSE 91 08/04/2022   BUN 17 08/04/2022   CREATININE 0.92 08/04/2022   CALCIUM 9.7 08/04/2022   EGFR 67 08/04/2022   GFRNONAA >60 12/27/2021   Lab Results  Component Value Date   CHOL 207 (H) 08/04/2022   HDL 49 08/04/2022   LDLCALC 124 (H) 08/04/2022   LDLDIRECT 122 (H) 02/03/2022   TRIG 191 (H) 08/04/2022   CHOLHDL 5.3 04/25/2017   Lab Results  Component Value Date   TSH 1.359 12/11/2018   No results found for: "HGBA1C" Lab Results  Component Value Date   WBC 5.5 12/27/2021   HGB 12.6 12/27/2021   HCT 38.7 12/27/2021   MCV 85.8 12/27/2021   PLT 268 12/27/2021   Lab Results  Component Value Date   ALT 17 08/04/2022   AST 21 08/04/2022   ALKPHOS 93 08/04/2022   BILITOT 0.3 08/04/2022   No results found for: "25OHVITD2", "25OHVITD3", "VD25OH"   Review of Systems  Constitutional: Negative.  Negative for chills, fatigue, fever and unexpected weight change.  HENT:  Negative for congestion, ear discharge, ear pain, postnasal drip, rhinorrhea, sinus pressure, sneezing and sore throat.   Respiratory:  Positive for cough, chest tightness, shortness of breath and wheezing. Negative for  stridor.   Cardiovascular:  Negative for chest pain, palpitations and leg swelling.  Gastrointestinal:  Negative for abdominal pain, blood in stool, constipation, diarrhea and nausea.  Genitourinary:  Negative for dysuria, flank pain, frequency, hematuria, urgency and vaginal discharge.  Musculoskeletal:  Negative for arthralgias, back pain and myalgias.  Skin:  Negative for rash.  Neurological:  Negative for dizziness, weakness and headaches.  Hematological:  Negative for adenopathy. Does not bruise/bleed easily.  Psychiatric/Behavioral:  Negative for dysphoric mood. The patient is not nervous/anxious.     Patient Active Problem List   Diagnosis Date Noted   Statin intolerance 02/06/2022   Spondylolisthesis of lumbar region 06/18/2020   Sacroiliitis (HCC) 06/18/2020   Status post total knee replacement using cement, left 04/13/2020   Esophageal dysphagia 07/04/2019   Familial hypercholesterolemia 07/04/2019   Gastroesophageal reflux disease 07/04/2019   Obesity (BMI 30.0-34.9) 03/13/2018   Primary osteoarthritis of left knee 03/13/2018   Hyperlipidemia, mixed 04/26/2017   Chest pain 04/24/2017   Essential hypertension 01/06/2016    Allergies  Allergen Reactions   Lisinopril Other (See Comments)    Chest pain, dizziness, cough   Codeine Hives    "hair crawling"    Hydrochlorothiazide Anxiety    Jittery feeling     Past Surgical History:  Procedure Laterality Date   BACK SURGERY  2014   CYST REMOVAL   BREAST BIOPSY Bilateral    neg   DILATION AND CURETTAGE OF UTERUS  TOTAL KNEE ARTHROPLASTY Left 04/13/2020   Procedure: TOTAL KNEE ARTHROPLASTY;  Surgeon: Christena Flake, MD;  Location: [facility] ORS;  Service: Orthopedics;  Laterality: Left;   TUBAL LIGATION      Social History   Tobacco Use   Smoking status: Never   Smokeless tobacco: Never  Vaping Use   Vaping Use: Never used  Substance Use Topics   Alcohol use: No    Alcohol/week: 0.0 standard drinks of  alcohol   Drug use: No     Medication list has been reviewed and updated.  Current Meds  Medication Sig   amLODipine (NORVASC) 5 MG tablet Take 1 tablet (5 mg total) by mouth daily.   Calcium Carbonate-Vit D-Min (CALCIUM 1200 PO) Take 1 tablet by mouth daily.    ibuprofen (ADVIL) 600 MG tablet Take 1 tablet (600 mg total) by mouth every 6 (six) hours as needed.   Lactobacillus-Inulin (CULTURELLE DIGESTIVE HEALTH PO) Take 1 capsule by mouth daily.   metoprolol succinate (TOPROL-XL) 50 MG 24 hr tablet Take 1 tablet (50 mg total) by mouth daily. Take with or immediately following a meal.   Multiple Vitamins-Minerals (ONE-A-DAY WOMENS 50 PLUS PO) Take 1 tablet by mouth daily.   nitroGLYCERIN (NITROSTAT) 0.4 MG SL tablet Place 1 tablet (0.4 mg total) under the tongue every 5 (five) minutes as needed for chest pain.   OVER THE COUNTER MEDICATION Take 2 each by mouth daily. Neuriva   pantoprazole (PROTONIX) 40 MG tablet Take 1 tablet (40 mg total) by mouth daily.   Vitamin D, Cholecalciferol, 25 MCG (1000 UT) TABS Take 1,000 Units by mouth daily.        09/25/2022    2:39 PM 08/04/2022    1:10 PM 06/26/2022    2:18 PM 02/03/2022    9:18 AM  GAD 7 : Generalized Anxiety Score  Nervous, Anxious, on Edge 0 0 0 0  Control/stop worrying 0 0 0 0  Worry too much - different things 0 0 0 0  Trouble relaxing 0 0 0 0  Restless 0 0 0 0  Easily annoyed or irritable 0 0 0 0  Afraid - awful might happen 0 0 0 0  Total GAD 7 Score 0 0 0 0  Anxiety Difficulty Not difficult at all Not difficult at all Not difficult at all Not difficult at all       09/25/2022    2:39 PM 08/04/2022    1:10 PM 08/03/2022   11:03 AM  Depression screen PHQ 2/9  Decreased Interest 0 0 0  Down, Depressed, Hopeless 0 0 1  PHQ - 2 Score 0 0 1  Altered sleeping 0 0 0  Tired, decreased energy 0 0 0  Change in appetite 0 0 0  Feeling bad or failure about yourself  0 0 0  Trouble concentrating 0 0 0  Moving slowly or  fidgety/restless 0 0 0  Suicidal thoughts 0 0 0  PHQ-9 Score 0 0 1  Difficult doing work/chores Not difficult at all Not difficult at all Not difficult at all    BP Readings from Last 3 Encounters:  09/25/22 118/76  08/04/22 122/76  06/26/22 (!) 130/90    Physical Exam Vitals and nursing note reviewed. Exam conducted with a chaperone present.  Constitutional:      General: She is not in acute distress.    Appearance: She is not diaphoretic.  HENT:     Head: Normocephalic and atraumatic.     Right Ear:  Tympanic membrane and external ear normal. There is no impacted cerumen.     Left Ear: Tympanic membrane and external ear normal. There is no impacted cerumen.     Nose: Nose normal. No congestion or rhinorrhea.     Mouth/Throat:     Mouth: Mucous membranes are moist.     Pharynx: No oropharyngeal exudate or posterior oropharyngeal erythema.  Eyes:     General:        Right eye: No discharge.        Left eye: No discharge.     Conjunctiva/sclera: Conjunctivae normal.     Pupils: Pupils are equal, round, and reactive to light.  Neck:     Thyroid: No thyromegaly.     Vascular: No JVD.  Cardiovascular:     Rate and Rhythm: Normal rate and regular rhythm.     Heart sounds: Normal heart sounds. No murmur heard.    No friction rub. No gallop.  Pulmonary:     Effort: Pulmonary effort is normal.     Breath sounds: Decreased air movement present. No stridor. Wheezing present. No decreased breath sounds, rhonchi or rales.  Abdominal:     General: Bowel sounds are normal.     Palpations: Abdomen is soft. There is no mass.     Tenderness: There is no abdominal tenderness. There is no guarding.  Musculoskeletal:        General: Normal range of motion.     Cervical back: Normal range of motion and neck supple.  Lymphadenopathy:     Cervical: No cervical adenopathy.  Skin:    General: Skin is warm and dry.  Neurological:     Mental Status: She is alert.     Deep Tendon Reflexes:  Reflexes are normal and symmetric.     Wt Readings from Last 3 Encounters:  09/25/22 201 lb (91.2 kg)  08/04/22 206 lb (93.4 kg)  08/03/22 208 lb (94.3 kg)    BP 118/76   Pulse 62   Ht 5\' 5"  (1.651 m)   Wt 201 lb (91.2 kg)   SpO2 98%   BMI 33.45 kg/m   Assessment and Plan:  1. Mild intermittent reactive airway disease with acute exacerbation New onset.  Persistent.  Acute exacerbation.  Patient for at least 2 weeks has had reactive airway disease exacerbation with cough wheezing shortness of breath with no fever or chills.  Nebulization improved air movement but increased wheezing as expected.  There were no rales noted but I do have concerns of his sequestered pneumonia for which we will initiate azithromycin.  We will continue with albuterol 2 puffs every 6 hours with AeroChamber and initiate low-dose prednisone 10 mg once a day. - DG Chest 2 View  2. Familial hypercholesterolemia New onset.  Persistent.  Patient would like to be more proactive towards control of lipids and we will initiate atorvastatin 10 mg once a day.  3. Essential hypertension Chronic.  Controlled.  Stable.  Blood pressure today is excellent 118/76.   Elizabeth Sauer, MD

## 2022-10-11 ENCOUNTER — Ambulatory Visit
Admission: RE | Admit: 2022-10-11 | Discharge: 2022-10-11 | Disposition: A | Discharge status: Home / Self Care | Payer: Medicare HMO | Attending: Physician Assistant | Admitting: Physician Assistant

## 2022-10-11 ENCOUNTER — Ambulatory Visit: Payer: Self-pay | Admitting: *Deleted

## 2022-10-11 ENCOUNTER — Encounter: Payer: Self-pay | Admitting: Physician Assistant

## 2022-10-11 ENCOUNTER — Ambulatory Visit
Admission: RE | Admit: 2022-10-11 | Discharge: 2022-10-11 | Disposition: A | Discharge status: Home / Self Care | Payer: Medicare HMO | Source: Ambulatory Visit | Attending: Physician Assistant | Admitting: Physician Assistant

## 2022-10-11 ENCOUNTER — Ambulatory Visit (INDEPENDENT_AMBULATORY_CARE_PROVIDER_SITE_OTHER): Payer: Medicare HMO | Admitting: Physician Assistant

## 2022-10-11 VITALS — BP 126/80 | HR 63 | Temp 97.8°F | Ht 65.0 in | Wt 206.0 lb

## 2022-10-11 DIAGNOSIS — R109 Unspecified abdominal pain: Secondary | ICD-10-CM | POA: Insufficient documentation

## 2022-10-11 DIAGNOSIS — M545 Low back pain, unspecified: Secondary | ICD-10-CM | POA: Diagnosis not present

## 2022-10-11 DIAGNOSIS — M25551 Pain in right hip: Secondary | ICD-10-CM | POA: Insufficient documentation

## 2022-10-11 DIAGNOSIS — W57XXXA Bitten or stung by nonvenomous insect and other nonvenomous arthropods, initial encounter: Secondary | ICD-10-CM | POA: Diagnosis not present

## 2022-10-11 DIAGNOSIS — M1611 Unilateral primary osteoarthritis, right hip: Secondary | ICD-10-CM | POA: Diagnosis not present

## 2022-10-11 DIAGNOSIS — S30861A Insect bite (nonvenomous) of abdominal wall, initial encounter: Secondary | ICD-10-CM | POA: Diagnosis not present

## 2022-10-11 DIAGNOSIS — M4316 Spondylolisthesis, lumbar region: Secondary | ICD-10-CM | POA: Diagnosis not present

## 2022-10-11 LAB — POCT URINALYSIS DIPSTICK
Bilirubin, UA: NEGATIVE
Blood, UA: NEGATIVE
Glucose, UA: NEGATIVE
Ketones, UA: NEGATIVE
Leukocytes, UA: NEGATIVE
Nitrite, UA: NEGATIVE
Protein, UA: NEGATIVE
Spec Grav, UA: 1.02 (ref 1.010–1.025)
Urobilinogen, UA: 0.2 E.U./dL
pH, UA: 6 (ref 5.0–8.0)

## 2022-10-11 MED ORDER — DOXYCYCLINE HYCLATE 100 MG PO TABS: 100.0000 mg | ORAL_TABLET | Freq: Two times a day (BID) | ORAL | 0 refills | Status: AC

## 2022-10-11 MED ORDER — MELOXICAM 15 MG PO TABS: 15.0000 mg | ORAL_TABLET | Freq: Every day | ORAL | 0 refills | Status: DC

## 2022-10-11 NOTE — Progress Notes (Addendum)
Date:  10/11/2022   Name:  Lauren Leblanc   DOB:  28-Feb-1951   MRN:  [id number]   Chief Complaint: Flank Pain (Right side)  Flank Pain This is a new problem. Episode onset: X4 days. The problem occurs constantly. The problem has been gradually worsening since onset. The pain is present in the lumbar spine (right side lower back to hip). The quality of the pain is described as aching. Radiates to: right hip. The pain is at a severity of 9/10. The pain is moderate. The pain is The same all the time. The symptoms are aggravated by sitting, twisting, standing, bending, lying down and position. Pertinent negatives include no chest pain, dysuria or fever. Treatments tried: tylenol. The treatment provided no relief.    Lauren Leblanc is a pleasant 72 year old female who presents new to me today for evaluation of acute right-sided hip/flank pain for the past 3 days.  She woke up Sunday morning and thought she might have slept on it wrong, but it has been gradually worsening since.  Pain improves when perfectly still, worsens with any movement or ambulation.  She can barely walk today.  No inciting injury, strain, or other event. Tylenol 650 mg two tablets at a time without relief; has not tried NSAIDs.  Recently prescribed 30 tablets of prednisone for reactive airway disease on 09/25/2022 to be taken as 10 mg daily, which she has been doing for roughly 2 weeks now -pulmonary symptoms have resolved.  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  Removed tick from left lower abdomen this morning, unsure how long it has been there, skin is red near the bite, also had some other ticks on her in recent weeks. Currently denies systemic symptoms such as fever, nausea, vomiting, chills, loss of appetite.  No history of kidney stones.  Denies urinary symptoms specifically dysuria, hematuria, frequency, urgency.   Medication list has been reviewed and updated.  Current Meds  Medication  Sig   albuterol (VENTOLIN HFA) 108 (90 Base) MCG/ACT inhaler Inhale 2 puffs into the lungs every 6 (six) hours as needed for wheezing or shortness of breath.   amLODipine (NORVASC) 5 MG tablet Take 1 tablet (5 mg total) by mouth daily.   Calcium Carbonate-Vit D-Min (CALCIUM 1200 PO) Take 1 tablet by mouth daily.    ibuprofen (ADVIL) 600 MG tablet Take 1 tablet (600 mg total) by mouth every 6 (six) hours as needed.   Lactobacillus-Inulin (CULTURELLE DIGESTIVE HEALTH PO) Take 1 capsule by mouth daily.   metoprolol succinate (TOPROL-XL) 50 MG 24 hr tablet Take 1 tablet (50 mg total) by mouth daily. Take with or immediately following a meal.   Multiple Vitamins-Minerals (ONE-A-DAY WOMENS 50 PLUS PO) Take 1 tablet by mouth daily.   nitroGLYCERIN (NITROSTAT) 0.4 MG SL tablet Place 1 tablet (0.4 mg total) under the tongue every 5 (five) minutes as needed for chest pain.   OVER THE COUNTER MEDICATION Take 2 each by mouth daily. Neuriva   pantoprazole (PROTONIX) 40 MG tablet Take 1 tablet (40 mg total) by mouth daily.   predniSONE (DELTASONE) 10 MG tablet Take 1 tablet (10 mg total) by mouth daily with breakfast.   Spacer/Aero-Holding Chambers (EQ SPACE CHAMBER ANTI-STATIC L) DEVI See admin instructions.   Vitamin D, Cholecalciferol, 25 MCG (1000 UT) TABS Take 1,000 Units by mouth daily.      Review of Systems  Constitutional:  Negative for fatigue and fever.  Respiratory:  Negative  for shortness of breath.   Cardiovascular:  Negative for chest pain.  Gastrointestinal:  Negative for blood in stool, constipation, diarrhea, nausea and vomiting.  Genitourinary:  Positive for flank pain. Negative for dysuria, frequency, hematuria and urgency.  Musculoskeletal:  Positive for arthralgias, back pain and gait problem.    Patient Active Problem List   Diagnosis Date Noted   Statin intolerance 02/06/2022   Spondylolisthesis of lumbar region 06/18/2020   Sacroiliitis (HCC) 06/18/2020   Status post total  knee replacement using cement, left 04/13/2020   Esophageal dysphagia 07/04/2019   Familial hypercholesterolemia 07/04/2019   Gastroesophageal reflux disease 07/04/2019   Obesity (BMI 30.0-34.9) 03/13/2018   Primary osteoarthritis of left knee 03/13/2018   Hyperlipidemia, mixed 04/26/2017   Chest pain 04/24/2017   Essential hypertension 01/06/2016    Allergies  Allergen Reactions   Lisinopril Other (See Comments)    Chest pain, dizziness, cough   Codeine Hives    "hair crawling"    Hydrochlorothiazide Anxiety    Jittery feeling     Immunization History  Administered Date(s) Administered   Fluad Quad(high Dose 65+) 02/02/2021, 12/29/2021   Influenza, High Dose Seasonal PF 02/01/2018   Influenza, Seasonal, Injecte, Preservative Fre 05/10/2012   Influenza,inj,Quad PF,6+ Mos 02/01/2017   Influenza-Unspecified 02/06/2020   PFIZER(Purple Top)SARS-COV-2 Vaccination 06/15/2019, 07/09/2019, 01/22/2020, 10/06/2020, 04/04/2021   PNEUMOCOCCAL CONJUGATE-20 02/03/2022   Pneumococcal Conjugate-13 02/01/2018   Pneumococcal Polysaccharide-23 07/21/2020   Rabies, IM 05/29/2022   Tdap 12/06/2018    Past Surgical History:  Procedure Laterality Date   BACK SURGERY  2014   CYST REMOVAL   BREAST BIOPSY Bilateral    neg   DILATION AND CURETTAGE OF UTERUS     TOTAL KNEE ARTHROPLASTY Left 04/13/2020   Procedure: TOTAL KNEE ARTHROPLASTY;  Surgeon: Christena Flake, MD;  Location: [facility] ORS;  Service: Orthopedics;  Laterality: Left;   TUBAL LIGATION      Social History   Tobacco Use   Smoking status: Never   Smokeless tobacco: Never  Vaping Use   Vaping Use: Never used  Substance Use Topics   Alcohol use: No    Alcohol/week: 0.0 standard drinks of alcohol   Drug use: No    Family History  Problem Relation Age of Onset   Coronary artery disease Mother 48   Hypertension Mother    Stroke Father    Diabetes Father    Hypertension Sister    Allergies Sister    Breast cancer Maternal  Aunt        3 mat aunts   Breast cancer Maternal Aunt    Breast cancer Maternal Aunt         09/25/2022    2:39 PM 08/04/2022    1:10 PM 06/26/2022    2:18 PM 02/03/2022    9:18 AM  GAD 7 : Generalized Anxiety Score  Nervous, Anxious, on Edge 0 0 0 0  Control/stop worrying 0 0 0 0  Worry too much - different things 0 0 0 0  Trouble relaxing 0 0 0 0  Restless 0 0 0 0  Easily annoyed or irritable 0 0 0 0  Afraid - awful might happen 0 0 0 0  Total GAD 7 Score 0 0 0 0  Anxiety Difficulty Not difficult at all Not difficult at all Not difficult at all Not difficult at all       09/25/2022    2:39 PM 08/04/2022    1:10 PM 08/03/2022   11:03 AM  Depression screen PHQ 2/9  Decreased Interest 0 0 0  Down, Depressed, Hopeless 0 0 1  PHQ - 2 Score 0 0 1  Altered sleeping 0 0 0  Tired, decreased energy 0 0 0  Change in appetite 0 0 0  Feeling bad or failure about yourself  0 0 0  Trouble concentrating 0 0 0  Moving slowly or fidgety/restless 0 0 0  Suicidal thoughts 0 0 0  PHQ-9 Score 0 0 1  Difficult doing work/chores Not difficult at all Not difficult at all Not difficult at all    BP Readings from Last 3 Encounters:  09/25/22 118/76  08/04/22 122/76  06/26/22 (!) 130/90    Wt Readings from Last 3 Encounters:  10/11/22 206 lb (93.4 kg)  09/25/22 201 lb (91.2 kg)  08/04/22 206 lb (93.4 kg)    Ht 5\' 5"  (1.651 m)   Wt 206 lb (93.4 kg)   BMI 34.28 kg/m   Physical Exam Vitals and nursing note reviewed.  Constitutional:      Appearance: Normal appearance.  Cardiovascular:     Rate and Rhythm: Normal rate and regular rhythm.     Heart sounds: No murmur heard.    No friction rub. No gallop.  Pulmonary:     Effort: Pulmonary effort is normal.     Breath sounds: Normal breath sounds.  Abdominal:     Tenderness: There is no abdominal tenderness.     Comments: No CVA tenderness bilaterally  Musculoskeletal:     Comments: No tenderness of midline spine or paraspinal  musculature.  There is moderate to severe tenderness to palpation of the right lumbar region which radiates toward the hip joint.  No overlying skin changes. Assessment of the right hip reveals full ROM, though she does have some pain with distal traction.  Straight leg raise reproduces pain in the back/hip but does not cause radiating pain down the leg. Antalgic gait favoring right leg.  Skin:    Comments: 3 cm circular area of erythema from presumed tick bite on the left abdomen/flank entheses see picture below).  It does not have the appearance of erythema migrans.      Urine Dipstick   Recent Labs     Component Value Date/Time   NA 143 08/04/2022 1358   K 4.6 08/04/2022 1358   CL 103 08/04/2022 1358   CO2 24 08/04/2022 1358   GLUCOSE 91 08/04/2022 1358   GLUCOSE 95 12/27/2021 1554   BUN 17 08/04/2022 1358   CREATININE 0.92 08/04/2022 1358   CALCIUM 9.7 08/04/2022 1358   PROT 6.6 08/04/2022 1358   ALBUMIN 4.5 08/04/2022 1358   AST 21 08/04/2022 1358   ALT 17 08/04/2022 1358   ALKPHOS 93 08/04/2022 1358   BILITOT 0.3 08/04/2022 1358   GFRNONAA >60 12/27/2021 1554   GFRAA 72 07/04/2019 0848    Lab Results  Component Value Date   WBC 5.5 12/27/2021   HGB 12.6 12/27/2021   HCT 38.7 12/27/2021   MCV 85.8 12/27/2021   PLT 268 12/27/2021   No results found for: "HGBA1C" Lab Results  Component Value Date   CHOL 207 (H) 08/04/2022   HDL 49 08/04/2022   LDLCALC 124 (H) 08/04/2022   LDLDIRECT 122 (H) 02/03/2022   TRIG 191 (H) 08/04/2022   CHOLHDL 5.3 04/25/2017   Lab Results  Component Value Date   TSH 1.359 12/11/2018     Assessment and Plan:  1. Acute pain of right hip Check for  Lyme disease, obtain lumbar spine and hip x-rays today, start meloxicam (take with food).  Stop prednisone.  DDx includes but is not limited to Lyme arthritis, osteoarthritis flare, MSK strain, nephrolithiasis, less likely avascular necrosis from prednisone use.  Advised patient to  proceed directly to ED if significantly worsening over the next 24 hours, she verbalizes understanding. - Lyme Disease Serology w/Reflex - Sedimentation rate - C-reactive protein - CBC with Differential/Platelet - DG Hip Unilat W OR W/O Pelvis 2-3 Views Right - DG Lumbar Spine Complete - meloxicam (MOBIC) 15 MG tablet; Take 1 tablet (15 mg total) by mouth daily.  Dispense: 15 tablet; Refill: 0  2. Flank pain Urinalysis negative today.  Plan as above. - POCT urinalysis dipstick - DG Hip Unilat W OR W/O Pelvis 2-3 Views Right - DG Lumbar Spine Complete - meloxicam (MOBIC) 15 MG tablet; Take 1 tablet (15 mg total) by mouth daily.  Dispense: 15 tablet; Refill: 0  3. Tick bite of abdomen, initial encounter Plan as above.  Begin doxycycline while we await serology.  Protect skin from sun while on this medication. - Lyme Disease Serology w/Reflex - doxycycline (VIBRA-TABS) 100 MG tablet; Take 1 tablet (100 mg total) by mouth 2 (two) times daily for 10 days. Do not take with dairy. This medication INCREASES SUN SENSITIVITY so avoid direct sunlight.  Dispense: 20 tablet; Refill: 0   F/u TBD  Partially dictated using Animal nutritionist. Any errors are unintentional.  Alvester Morin, PA-C, DMSc, Nutritionist [facility] [phone]

## 2022-10-11 NOTE — Telephone Encounter (Signed)
Reason for Disposition  [1] SEVERE back pain (e.g., excruciating, unable to do any normal activities) AND [2] not improved 2 hours after pain medicine  Answer Assessment - Initial Assessment Questions 1. ONSET: "When did the pain begin?"      Sunday 2. LOCATION: "Where does it hurt?" (upper, mid or lower back)     R sided back pain 3. SEVERITY: "How bad is the pain?"  (e.g., Scale 1-10; mild, moderate, or severe)   - MILD (1-3): Doesn't interfere with normal activities.    - MODERATE (4-7): Interferes with normal activities or awakens from sleep.    - SEVERE (8-10): Excruciating pain, unable to do any normal activities.      Severe- can barely walk 4. PATTERN: "Is the pain constant?" (e.g., yes, no; constant, intermittent)      Constant- unable to sleep 5. RADIATION: "Does the pain shoot into your legs or somewhere else?"     no 6. CAUSE:  "What do you think is causing the back pain?"      Possible UTI 7. BACK OVERUSE:  "Any recent lifting of heavy objects, strenuous work or exercise?"     No-at first patient thought she may have hurt herself 8. MEDICINES: "What have you taken so far for the pain?" (e.g., nothing, acetaminophen, NSAIDS)     Tylenol 600mg -2 did not help 9. NEUROLOGIC SYMPTOMS: "Do you have any weakness, numbness, or problems with bowel/bladder control?"     no 10. OTHER SYMPTOMS: "Do you have any other symptoms?" (e.g., fever, abdomen pain, burning with urination, blood in urine)       no  Protocols used: Back Pain-A-AH

## 2022-10-11 NOTE — Telephone Encounter (Signed)
  Chief Complaint: R sided back pain Symptoms: pain only Frequency: started Sunday Pertinent Negatives: Patient denies fever, abdomen pain, burning with urination, blood in urine Disposition: [] ED /[] Urgent Care (no appt availability in office) / [x] Appointment(In office/virtual)/ []  [facility]/ [] Home Care/ [] Refused Recommended Disposition /[] [place] [facility]/ []  Follow-up with PCP Additional Notes: Patient thinks her symptoms could be related to urinary tract- PCP is not in office today- scheduled with alternate provider in office

## 2022-10-12 LAB — CBC WITH DIFFERENTIAL/PLATELET
Basophils Absolute: 0 10*3/uL (ref 0.0–0.2)
Basos: 1 %
EOS (ABSOLUTE): 0.3 10*3/uL (ref 0.0–0.4)
Eos: 4 %
Hematocrit: 36.4 % (ref 34.0–46.6)
Hemoglobin: 11.9 g/dL (ref 11.1–15.9)
Immature Grans (Abs): 0 10*3/uL (ref 0.0–0.1)
Immature Granulocytes: 1 %
Lymphocytes Absolute: 2.2 10*3/uL (ref 0.7–3.1)
Lymphs: 26 %
MCH: 29.1 pg (ref 26.6–33.0)
MCHC: 32.7 g/dL (ref 31.5–35.7)
MCV: 89 fL (ref 79–97)
Monocytes Absolute: 0.5 10*3/uL (ref 0.1–0.9)
Monocytes: 5 %
Neutrophils Absolute: 5.3 10*3/uL (ref 1.4–7.0)
Neutrophils: 63 %
Platelets: 252 10*3/uL (ref 150–450)
RBC: 4.09 x10E6/uL (ref 3.77–5.28)
RDW: 13.8 % (ref 11.7–15.4)
WBC: 8.4 10*3/uL (ref 3.4–10.8)

## 2022-10-12 LAB — LYME DISEASE SEROLOGY W/REFLEX: Lyme Total Antibody EIA: NEGATIVE

## 2022-10-12 LAB — SEDIMENTATION RATE: Sed Rate: 2 mm/hr (ref 0–40)

## 2022-10-12 LAB — C-REACTIVE PROTEIN: CRP: 4 mg/L (ref 0–10)

## 2022-10-16 ENCOUNTER — Encounter: Payer: Self-pay | Admitting: Physician Assistant

## 2022-10-16 DIAGNOSIS — M5136 Other intervertebral disc degeneration, lumbar region: Secondary | ICD-10-CM | POA: Insufficient documentation

## 2022-10-16 DIAGNOSIS — M51369 Other intervertebral disc degeneration, lumbar region without mention of lumbar back pain or lower extremity pain: Secondary | ICD-10-CM | POA: Insufficient documentation

## 2022-10-16 NOTE — Progress Notes (Signed)
Pt stated she is able to walk.  KP

## 2022-12-29 ENCOUNTER — Other Ambulatory Visit: Payer: Self-pay | Admitting: Family Medicine

## 2022-12-29 DIAGNOSIS — K219 Gastro-esophageal reflux disease without esophagitis: Secondary | ICD-10-CM

## 2022-12-29 NOTE — Telephone Encounter (Signed)
Requested Prescriptions  Pending Prescriptions Disp Refills   pantoprazole (PROTONIX) 40 MG tablet [Pharmacy Med Name: Pantoprazole Sodium 40 MG Oral Tablet Delayed Release] 90 tablet 0    Sig: Take 1 tablet by mouth once daily     Gastroenterology: Proton Pump Inhibitors Passed - 12/29/2022  8:05 AM      Passed - Valid encounter within last 12 months    Recent Outpatient Visits           2 months ago Acute pain of right hip   [facility] at [facility], Melton Alar, PA   3 months ago Mild intermittent reactive airway disease with acute exacerbation   [place] [facility], [facility]   4 months ago Essential hypertension   [place] [facility] at [facility], [facility]   6 months ago Facial injury, subsequent encounter   [facility] at [facility], [facility]   10 months ago Essential hypertension   [place] [facility] at [facility], [facility]       Future Appointments             In 1 month Duanne Limerick, MD [facility] at [facility], [facility]

## 2023-01-05 ENCOUNTER — Ambulatory Visit
Admission: EM | Admit: 2023-01-05 | Discharge: 2023-01-05 | Disposition: A | Discharge status: Home / Self Care | Payer: Medicare HMO | Attending: Physician Assistant | Admitting: Physician Assistant

## 2023-01-05 ENCOUNTER — Encounter: Payer: Self-pay | Admitting: Emergency Medicine

## 2023-01-05 ENCOUNTER — Ambulatory Visit (INDEPENDENT_AMBULATORY_CARE_PROVIDER_SITE_OTHER): Payer: Medicare HMO

## 2023-01-05 ENCOUNTER — Ambulatory Visit: Payer: Self-pay | Admitting: *Deleted

## 2023-01-05 DIAGNOSIS — Z1152 Encounter for screening for COVID-19: Secondary | ICD-10-CM | POA: Diagnosis not present

## 2023-01-05 DIAGNOSIS — J111 Influenza due to unidentified influenza virus with other respiratory manifestations: Secondary | ICD-10-CM | POA: Insufficient documentation

## 2023-01-05 DIAGNOSIS — R062 Wheezing: Secondary | ICD-10-CM | POA: Diagnosis not present

## 2023-01-05 DIAGNOSIS — R509 Fever, unspecified: Secondary | ICD-10-CM | POA: Diagnosis not present

## 2023-01-05 LAB — SARS CORONAVIRUS 2 BY RT PCR: SARS Coronavirus 2 by RT PCR: NEGATIVE

## 2023-01-05 LAB — RAPID INFLUENZA A&B ANTIGENS
Influenza A (ARMC): NEGATIVE
Influenza B (ARMC): NEGATIVE

## 2023-01-05 LAB — GROUP A STREP BY PCR: Group A Strep by PCR: NOT DETECTED

## 2023-01-05 MED ORDER — BENZONATATE 200 MG PO CAPS: 200.0000 mg | ORAL_CAPSULE | Freq: Three times a day (TID) | ORAL | 0 refills | Status: DC | PRN

## 2023-01-05 NOTE — ED Triage Notes (Signed)
Pt c/o headache, body aches, chills, fever, sore throat, runny nose. Started last night. Pt also got a shingles vaccines yesterday. Pt daughter has covid.

## 2023-01-05 NOTE — ED Provider Notes (Addendum)
[facility]    CSN: [id number] Arrival date & time: 01/05/23  1452      History   Chief Complaint Chief Complaint  Patient presents with   Generalized Body Aches   Fever    HPI Lauren Leblanc is a 72 y.o. female who presents with onset of HA, body aches, cough,chills, fever, rhinitis, ST since 6 am today.  Her daughter has Covid and she had Shingles vaccine. She was fine yesterday other than being fatigued.  She was around her daughter Tuesday and neither one of them knew she had it.  She has a non productive cough, and gets bouts of cough fits. She had bronchitis one month ago and feels she recovered.     Past Medical History:  Diagnosis Date   Angina at rest    Arthritis    GERD (gastroesophageal reflux disease)    Hypertension     Patient Active Problem List   Diagnosis Date Noted   Degenerative disc disease, lumbar 10/16/2022   Statin intolerance 02/06/2022   Spondylolisthesis of lumbar region 06/18/2020   Sacroiliitis (HCC) 06/18/2020   Status post total knee replacement using cement, left 04/13/2020   Esophageal dysphagia 07/04/2019   Familial hypercholesterolemia 07/04/2019   Gastroesophageal reflux disease 07/04/2019   Obesity (BMI 30.0-34.9) 03/13/2018   Primary osteoarthritis of left knee 03/13/2018   Hyperlipidemia, mixed 04/26/2017   Chest pain 04/24/2017   Essential hypertension 01/06/2016    Past Surgical History:  Procedure Laterality Date   BACK SURGERY  2014   CYST REMOVAL   BREAST BIOPSY Bilateral    neg   DILATION AND CURETTAGE OF UTERUS     TOTAL KNEE ARTHROPLASTY Left 04/13/2020   Procedure: TOTAL KNEE ARTHROPLASTY;  Surgeon: Christena Flake, MD;  Location: [facility];  Service: Orthopedics;  Laterality: Left;   TUBAL LIGATION      OB History   No obstetric history on file.      Home Medications    Prior to Admission medications   Medication Sig Start Date End Date Taking? Authorizing Provider  albuterol  (VENTOLIN HFA) 108 (90 Base) MCG/ACT inhaler Inhale 2 puffs into the lungs every 6 (six) hours as needed for wheezing or shortness of breath. 09/25/22  Yes Duanne Limerick, MD  amLODipine (NORVASC) 5 MG tablet Take 1 tablet (5 mg total) by mouth daily. 08/04/22  Yes Duanne Limerick, MD  atorvastatin (LIPITOR) 10 MG tablet Take 1 tablet (10 mg total) by mouth daily. 09/25/22  Yes Duanne Limerick, MD  benzonatate (TESSALON) 200 MG capsule Take 1 capsule (200 mg total) by mouth 3 (three) times daily as needed for cough. 01/05/23  Yes Rodriguez-Southworth, Nettie Elm, PA-C  pantoprazole (PROTONIX) 40 MG tablet Take 1 tablet by mouth once daily 12/29/22  Yes Duanne Limerick, MD  Calcium Carbonate-Vit D-Min (CALCIUM 1200 PO) Take 1 tablet by mouth daily.     [provider]  ibuprofen (ADVIL) 600 MG tablet Take 1 tablet (600 mg total) by mouth every 6 (six) hours as needed. 12/26/21   Becky Augusta, NP  Lactobacillus-Inulin (CULTURELLE DIGESTIVE HEALTH PO) Take 1 capsule by mouth daily.    [provider]  meloxicam (MOBIC) 15 MG tablet Take 1 tablet (15 mg total) by mouth daily. 10/11/22   Remo Lipps, PA  metoprolol succinate (TOPROL-XL) 50 MG 24 hr tablet Take 1 tablet (50 mg total) by mouth daily. Take with or immediately following a meal. 08/04/22   Elizabeth Sauer  C, MD  Multiple Vitamins-Minerals (ONE-A-DAY WOMENS 50 PLUS PO) Take 1 tablet by mouth daily.    [provider]  nitroGLYCERIN (NITROSTAT) 0.4 MG SL tablet Place 1 tablet (0.4 mg total) under the tongue every 5 (five) minutes as needed for chest pain. 08/03/21   Duanne Limerick, MD  OVER THE COUNTER MEDICATION Take 2 each by mouth daily. Neuriva    [provider]  Spacer/Aero-Holding Chambers (EQ SPACE CHAMBER ANTI-STATIC L) DEVI See admin instructions. 09/25/22   [provider]  Vitamin D, Cholecalciferol, 25 MCG (1000 UT) TABS Take 1,000 Units by mouth daily.     [provider]    Family  History Family History  Problem Relation Age of Onset   Coronary artery disease Mother 60   Hypertension Mother    Stroke Father    Diabetes Father    Hypertension Sister    Allergies Sister    Breast cancer Maternal Aunt        3 mat aunts   Breast cancer Maternal Aunt    Breast cancer Maternal Aunt     Social History Social History   Tobacco Use   Smoking status: Never   Smokeless tobacco: Never  Vaping Use   Vaping status: Never Used  Substance Use Topics   Alcohol use: No    Alcohol/week: 0.0 standard drinks of alcohol   Drug use: No     Allergies   Lisinopril, Codeine, and Hydrochlorothiazide   Review of Systems Review of Systems As noted in HPI  Physical Exam Triage Vital Signs ED Triage Vitals  Encounter Vitals Group     BP 01/05/23 1517 137/79     Systolic BP Percentile --      Diastolic BP Percentile --      Pulse Rate 01/05/23 1517 69     Resp 01/05/23 1517 16     Temp 01/05/23 1517 99.2 F (37.3 C)     Temp Source 01/05/23 1517 Oral     SpO2 01/05/23 1517 96 %     Weight 01/05/23 1515 205 lb 14.6 oz (93.4 kg)     Height 01/05/23 1515 5\' 5"  (1.651 m)     Head Circumference --      Peak Flow --      Pain Score 01/05/23 1514 8     Pain Loc --      Pain Education --      Exclude from Growth Chart --    No data found.  Updated Vital Signs BP 137/79 (BP Location: Right Arm)   Pulse 69   Temp 99.2 F (37.3 C) (Oral)   Resp 16   Ht 5\' 5"  (1.651 m)   Wt 205 lb 14.6 oz (93.4 kg)   SpO2 96%   BMI 34.27 kg/m   Visual Acuity Right Eye Distance:   Left Eye Distance:   Bilateral Distance:    Right Eye Near:   Left Eye Near:    Bilateral Near:     Physical Exam Physical Exam Vitals signs and nursing note reviewed.  Constitutional:      General: She is not in acute distress.    Appearance: Normal appearance. She is not ill-appearing, toxic-appearing or diaphoretic.  HENT:     Head: Normocephalic.     Right Ear: Tympanic membrane,  ear canal and external ear normal.     Left Ear: Tympanic membrane, ear canal and external ear normal.     Nose: Nose normal.  Mouth/Throat:     Mouth: Mucous membranes are moist.  Eyes:     General: No scleral icterus.       Right eye: No discharge.        Left eye: No discharge.     Conjunctiva/sclera: Conjunctivae normal.  Neck:     Musculoskeletal: Neck supple. No neck rigidity.  Cardiovascular:     Rate and Rhythm: Normal rate and regular rhythm.     Heart sounds: No murmur.  Pulmonary:     Effort: Pulmonary effort is normal.     Breath sounds: mild expiratory wheezing present on L lung Abdominal:     General: Bowel sounds are normal. There is no distension.     Palpations: Abdomen is soft. There is no mass.     Tenderness: There is no abdominal tenderness. There is no guarding or rebound.     Hernia: No hernia is present.  Musculoskeletal: Normal range of motion.  Lymphadenopathy:     Cervical: No cervical adenopathy.  Skin:    General: Skin is warm and dry.     Coloration: Skin is not jaundiced.     Findings: No rash.  Neurological:     Mental Status: She is alert and oriented to person, place, and time.     Gait: Gait normal.  Psychiatric:        Mood and Affect: Mood normal.        Behavior: Behavior normal.        Thought Content: Thought content normal.        Judgment: Judgment normal.    UC Treatments / Results  Labs (all labs ordered are listed, but only abnormal results are displayed) Labs Reviewed  SARS CORONAVIRUS 2 BY RT PCR  GROUP A STREP BY PCR  RAPID INFLUENZA A&B ANTIGENS  Covid, flu and strep tests are negative  EKG   Radiology Ready by me and compared to her prior one from Jan 2024 and looks unchanged  Procedures Procedures (including critical care time)  Medications Ordered in UC Medications - No data to display  Initial Impression / Assessment and Plan / UC Course  I have reviewed the triage vital signs and the nursing  notes.  Pertinent labs & imaging results that were available during my care of the patient were reviewed by me and considered in my medical decision making (see chart for details).  Flu like illness  I placed her on Tessalon as noted and advised to stay quarantined while she has symptoms. She may repeat the Covid test in 2 days in case today's result is too early. I explained as long as she does the test within 5 days, so she can benefit from the antiviral.    Final Clinical Impressions(s) / UC Diagnoses   Final diagnoses:  Influenza-like illness     Discharge Instructions      The Covid, strep test  and flu tests were negative. The chest xray shows to be normal when compared to the last one from this year.  But I will call you when the final read for the chest xray comes back if it shows pneumonia If your symptoms persists, get the Covid test repeated in 2-3 day, as long as within 5 day of onset of symptoms, so you can benefit of treatment if it is positive     ED Prescriptions     Medication Sig Dispense Auth. Provider   benzonatate (TESSALON) 200 MG capsule Take 1 capsule (200 mg total) by  mouth 3 (three) times daily as needed for cough. 30 capsule Rodriguez-Southworth, Nettie Elm, PA-C      PDMP not reviewed this encounter.   Garey Ham, PA-C 01/05/23 1733    Rodriguez-Southworth, [place], PA-C 01/05/23 1735

## 2023-01-05 NOTE — Telephone Encounter (Signed)
Message from [place] C sent at 01/05/2023  1:29 PM EDT  Summary: cold  and flu like symptoms / rx req   The patient shares that they have been in close contact with someone covid positive  The patient shares that they are experiencing body aches, headache, sore throat and cough  The patient would like to be prescribed something for their symptoms  Please contact further when possible          Call History  Contact Date/Time Type Contact Phone/Fax User  01/05/2023 01:28 PM EDT Phone (Incoming) Leblanc, Lauren Pain "Lauren Leblanc" (Self) [phone] Judie Petit) Izora Leblanc, Lauren A   Reason for Disposition  Cough with cold symptoms (e.g., runny nose, postnasal drip, throat clearing)    She has been exposed to Covid and sure she has it. Doesn't have a home test to use.   Wanting Paxlovid because she has a trip to [place] coming up and needs to get over this.  Answer Assessment - Initial Assessment Questions 1. ONSET: "When did the cough begin?"      Symptoms began yesterday.   I was fatigued and started feeling bad.   During the night my body aches started.   I had fever and chills last night.   Sore throat, dry cough,  2. SEVERITY: "How bad is the cough today?"      It's dry cough 3. SPUTUM: "Describe the color of your sputum" (none, dry cough; clear, white, yellow, green)     N/A 4. HEMOPTYSIS: "Are you coughing up any blood?" If so ask: "How much?" (flecks, streaks, tablespoons, etc.)     Not asked 5. DIFFICULTY BREATHING: "Are you having difficulty breathing?" If Yes, ask: "How bad is it?" (e.g., mild, moderate, severe)    - MILD: No SOB at rest, mild SOB with walking, speaks normally in sentences, can lie down, no retractions, pulse < 100.    - MODERATE: SOB at rest, SOB with minimal exertion and prefers to sit, cannot lie down flat, speaks in phrases, mild retractions, audible wheezing, pulse 100-120.    - SEVERE: Very SOB at rest, speaks in single words, struggling to breathe, sitting  hunched forward, retractions, pulse > 120      Having some shortness of breath 6. FEVER: "Do you have a fever?" If Yes, ask: "What is your temperature, how was it measured, and when did it start?"     Having chills 7. CARDIAC HISTORY: "Do you have any history of heart disease?" (e.g., heart attack, congestive heart failure)      Not asked 8. LUNG HISTORY: "Do you have any history of lung disease?"  (e.g., pulmonary embolus, asthma, emphysema)     Not asked 9. PE RISK FACTORS: "Do you have a history of blood clots?" (or: recent major surgery, recent prolonged travel, bedridden)     age 72. OTHER SYMPTOMS: "Do you have any other symptoms?" (e.g., runny nose, wheezing, chest pain)       Body aches, headache, sore throat, dry cough, my eyes hurt,  11. PREGNANCY: "Is there any chance you are pregnant?" "When was your last menstrual period?"       N/A due to age 68. TRAVEL: "Have you traveled out of the country in the last month?" (e.g., travel history, exposures)       She does have a trip coming up to [place] on Oct. 9, 2024.   She got her shingles shot yesterday and at first thought it was a reaction from that until she woke  up during the night with the Covid symptoms.   She's been exposed to Covid.  She needs to get her flu and covid shots and was supposed to do that on Monday but now having covid delays that.   I need those shots in time enough to be effective when I leave on my trip.   This being sick with covid is bad timing. There were no appts with Dr. Yetta Barre or any of the other providers today.   I suggested either the urgent care or a [facility] virtual care via MyChart.   She preferred to go to the urgent care.  "I'm not very good with computer stuff and I've never done a video visit before".  Protocols used: Cough - Acute Non-Productive-A-AH

## 2023-01-05 NOTE — Discharge Instructions (Addendum)
The Covid, strep test  and flu tests were negative. The chest xray shows to be normal when compared to the last one from this year.  But I will call you when the final read for the chest xray comes back if it shows pneumonia If your symptoms persists, get the Covid test repeated in 2-3 day, as long as within 5 day of onset of symptoms, so you can benefit of treatment if it is positive

## 2023-01-05 NOTE — Telephone Encounter (Signed)
  Chief Complaint: Sure she has Covid.  Has the symptoms and has been exposed to covid.  Doesn't have a test kit at home. Symptoms: Bad body aches, headache, chills, sore throat and coughing along with feeling very tired, and some shortness of breath.  Frequency: Symptoms started yesterday afternoon and got worse during the night Pertinent Negatives: Patient denies having a test kit at home to use. Disposition: [] ED /[x] Urgent Care (no appt availability in office) / [] Appointment(In office/virtual)/ []  [facility]/ [] Home Care/ [] Refused Recommended Disposition /[] [facility]/ []  Follow-up with PCP Additional Notes: There are no appts available this afternoon with any of the providers.   I suggested a MyChart virtual visit or urgent care so she can get started on an antiviral since she has a trip to [place] coming up.    She preferred to go to the urgent care.   She wasn't comfortable with the computer technology to do the virtual visit.

## 2023-01-26 ENCOUNTER — Other Ambulatory Visit: Payer: Self-pay | Admitting: Family Medicine

## 2023-01-26 DIAGNOSIS — I10 Essential (primary) hypertension: Secondary | ICD-10-CM

## 2023-01-26 NOTE — Telephone Encounter (Signed)
Requested Prescriptions  Pending Prescriptions Disp Refills   amLODipine (NORVASC) 5 MG tablet [Pharmacy Med Name: amLODIPine Besylate 5 MG Oral Tablet] 90 tablet 0    Sig: Take 1 tablet by mouth once daily     Cardiovascular: Calcium Channel Blockers 2 Passed - 01/26/2023 11:02 AM      Passed - Last BP in normal range    BP Readings from Last 1 Encounters:  01/05/23 137/79         Passed - Last Heart Rate in normal range    Pulse Readings from Last 1 Encounters:  01/05/23 69         Passed - Valid encounter within last 6 months    Recent Outpatient Visits           3 months ago Acute pain of right hip   [facility] at [facility], Melton Alar, [place]   4 months ago Mild intermittent reactive airway disease with acute exacerbation   [place] [facility]   5 months ago Essential hypertension   [place] [facility]   7 months ago Facial injury, subsequent encounter   [facility]   11 months ago Essential hypertension   [place] [facility]       Future Appointments             In 2 weeks Duanne Limerick, MD [facility] at [facility], [facility]

## 2023-02-09 ENCOUNTER — Ambulatory Visit: Payer: Medicare HMO | Admitting: Family Medicine

## 2023-02-12 ENCOUNTER — Encounter: Payer: Self-pay | Admitting: Family Medicine

## 2023-02-12 ENCOUNTER — Ambulatory Visit (INDEPENDENT_AMBULATORY_CARE_PROVIDER_SITE_OTHER): Payer: Medicare HMO | Admitting: Family Medicine

## 2023-02-12 VITALS — BP 114/82 | HR 67 | Ht 65.0 in | Wt 201.0 lb

## 2023-02-12 DIAGNOSIS — Z789 Other specified health status: Secondary | ICD-10-CM | POA: Diagnosis not present

## 2023-02-12 DIAGNOSIS — K219 Gastro-esophageal reflux disease without esophagitis: Secondary | ICD-10-CM

## 2023-02-12 DIAGNOSIS — I10 Essential (primary) hypertension: Secondary | ICD-10-CM

## 2023-02-12 MED ORDER — METOPROLOL SUCCINATE ER 50 MG PO TB24: 50.0000 mg | ORAL_TABLET | Freq: Every day | ORAL | 1 refills | Status: DC

## 2023-02-12 MED ORDER — AMLODIPINE BESYLATE 5 MG PO TABS: 5.0000 mg | ORAL_TABLET | Freq: Every day | ORAL | 0 refills | Status: DC

## 2023-02-12 MED ORDER — PANTOPRAZOLE SODIUM 40 MG PO TBEC: 40.0000 mg | DELAYED_RELEASE_TABLET | Freq: Every day | ORAL | 0 refills | Status: DC

## 2023-02-12 NOTE — Patient Instructions (Signed)

## 2023-02-12 NOTE — Progress Notes (Addendum)
Date:  02/12/2023   Name:  Lauren Leblanc   DOB:  1950/07/27   MRN:  [id number]   Chief Complaint: Gastroesophageal Reflux, Hypertension, and Hyperlipidemia (Stopped atorvastatin for 2 weeks, tried it for 2 months, made her tired was a struggle to wake up in the morning)  Gastroesophageal Reflux She reports no abdominal pain, no chest pain, no coughing, no dysphagia, no heartburn, no nausea, no sore throat or no wheezing. This is a chronic problem. The current episode started more than 1 year ago. The problem has been gradually improving. The symptoms are aggravated by certain foods. She has tried a PPI for the symptoms. The treatment provided moderate relief. Past procedures do not include an abdominal ultrasound, esophageal pH monitoring or a UGI.  Hypertension This is a chronic problem. The current episode started more than 1 year ago. The problem has been waxing and waning since onset. The problem is controlled. Pertinent negatives include no anxiety, blurred vision, chest pain, headaches, malaise/fatigue, neck pain, orthopnea, palpitations, peripheral edema, PND, shortness of breath or sweats. Past treatments include ACE inhibitors. The current treatment provides moderate improvement. There are no compliance problems.  There is no history of CAD/MI or CVA. There is no history of chronic renal disease, a hypertension causing med or renovascular disease.  Hyperlipidemia This is a chronic problem. The current episode started more than 1 year ago. The problem is controlled. Recent lipid tests were reviewed and are normal. Exacerbating diseases include obesity. She has no history of chronic renal disease or hypothyroidism. Pertinent negatives include no chest pain, leg pain, myalgias or shortness of breath. Current antihyperlipidemic treatment includes statins and ezetimibe. The current treatment provides moderate improvement of lipids. There are no compliance problems.  Risk factors for  coronary artery disease include dyslipidemia and hypertension.    Lab Results  Component Value Date   NA 143 08/04/2022   K 4.6 08/04/2022   CO2 24 08/04/2022   GLUCOSE 91 08/04/2022   BUN 17 08/04/2022   CREATININE 0.92 08/04/2022   CALCIUM 9.7 08/04/2022   EGFR 67 08/04/2022   GFRNONAA >60 12/27/2021   Lab Results  Component Value Date   CHOL 207 (H) 08/04/2022   HDL 49 08/04/2022   LDLCALC 124 (H) 08/04/2022   LDLDIRECT 122 (H) 02/03/2022   TRIG 191 (H) 08/04/2022   CHOLHDL 5.3 04/25/2017   Lab Results  Component Value Date   TSH 1.359 12/11/2018   No results found for: "HGBA1C" Lab Results  Component Value Date   WBC 8.4 10/11/2022   HGB 11.9 10/11/2022   HCT 36.4 10/11/2022   MCV 89 10/11/2022   PLT 252 10/11/2022   Lab Results  Component Value Date   ALT 17 08/04/2022   AST 21 08/04/2022   ALKPHOS 93 08/04/2022   BILITOT 0.3 08/04/2022   No results found for: "25OHVITD2", "25OHVITD3", "VD25OH"   Review of Systems  Constitutional:  Negative for malaise/fatigue.  HENT:  Negative for congestion, dental problem and sore throat.   Eyes:  Negative for blurred vision.  Respiratory:  Negative for cough, chest tightness, shortness of breath and wheezing.   Cardiovascular:  Negative for chest pain, palpitations, orthopnea and PND.  Gastrointestinal:  Negative for abdominal pain, dysphagia, heartburn and nausea.  Musculoskeletal:  Negative for myalgias and neck pain.  Neurological:  Negative for headaches.    Patient Active Problem List   Diagnosis Date Noted   Degenerative disc disease, lumbar 10/16/2022   Statin intolerance 02/06/2022  Spondylolisthesis of lumbar region 06/18/2020   Sacroiliitis (HCC) 06/18/2020   Status post total knee replacement using cement, left 04/13/2020   Esophageal dysphagia 07/04/2019   Familial hypercholesterolemia 07/04/2019   Gastroesophageal reflux disease 07/04/2019   Obesity (BMI 30.0-34.9) 03/13/2018   Primary  osteoarthritis of left knee 03/13/2018   Hyperlipidemia, mixed 04/26/2017   Chest pain 04/24/2017   Essential hypertension 01/06/2016    Allergies  Allergen Reactions   Lisinopril Other (See Comments)    Chest pain, dizziness, cough   Codeine Hives    "hair crawling"    Hydrochlorothiazide Anxiety    Jittery feeling     Past Surgical History:  Procedure Laterality Date   BACK SURGERY  2014   CYST REMOVAL   BREAST BIOPSY Bilateral    neg   DILATION AND CURETTAGE OF UTERUS     TOTAL KNEE ARTHROPLASTY Left 04/13/2020   Procedure: TOTAL KNEE ARTHROPLASTY;  Surgeon: Christena Flake, MD;  Location: [facility] ORS;  Service: Orthopedics;  Laterality: Left;   TUBAL LIGATION      Social History   Tobacco Use   Smoking status: Never   Smokeless tobacco: Never  Vaping Use   Vaping status: Never Used  Substance Use Topics   Alcohol use: No    Alcohol/week: 0.0 standard drinks of alcohol   Drug use: No     Medication list has been reviewed and updated.  Current Meds  Medication Sig   amLODipine (NORVASC) 5 MG tablet Take 1 tablet by mouth once daily   ibuprofen (ADVIL) 600 MG tablet Take 1 tablet (600 mg total) by mouth every 6 (six) hours as needed.   Lactobacillus-Inulin (CULTURELLE DIGESTIVE HEALTH PO) Take 1 capsule by mouth daily.   metoprolol succinate (TOPROL-XL) 50 MG 24 hr tablet Take 1 tablet (50 mg total) by mouth daily. Take with or immediately following a meal.   Multiple Vitamins-Minerals (ONE-A-DAY WOMENS 50 PLUS PO) Take 1 tablet by mouth daily.   nitroGLYCERIN (NITROSTAT) 0.4 MG SL tablet Place 1 tablet (0.4 mg total) under the tongue every 5 (five) minutes as needed for chest pain.   OVER THE COUNTER MEDICATION Take 2 each by mouth daily. Neuriva   pantoprazole (PROTONIX) 40 MG tablet Take 1 tablet by mouth once daily   [DISCONTINUED] albuterol (VENTOLIN HFA) 108 (90 Base) MCG/ACT inhaler Inhale 2 puffs into the lungs every 6 (six) hours as needed for wheezing or  shortness of breath.   [DISCONTINUED] Calcium Carbonate-Vit D-Min (CALCIUM 1200 PO) Take 1 tablet by mouth daily.    [DISCONTINUED] meloxicam (MOBIC) 15 MG tablet Take 1 tablet (15 mg total) by mouth daily.   [DISCONTINUED] Vitamin D, Cholecalciferol, 25 MCG (1000 UT) TABS Take 1,000 Units by mouth daily.        02/12/2023    2:41 PM 10/11/2022    9:39 AM 09/25/2022    2:39 PM 08/04/2022    1:10 PM  GAD 7 : Generalized Anxiety Score  Nervous, Anxious, on Edge 0 0 0 0  Control/stop worrying 0 0 0 0  Worry too much - different things 0 0 0 0  Trouble relaxing 0 0 0 0  Restless 0 0 0 0  Easily annoyed or irritable 0 0 0 0  Afraid - awful might happen 0 0 0 0  Total GAD 7 Score 0 0 0 0  Anxiety Difficulty Not difficult at all Not difficult at all Not difficult at all Not difficult at all  02/12/2023    2:41 PM 10/11/2022    9:39 AM 09/25/2022    2:39 PM  Depression screen PHQ 2/9  Decreased Interest 0 0 0  Down, Depressed, Hopeless 0 0 0  PHQ - 2 Score 0 0 0  Altered sleeping 0 2 0  Tired, decreased energy 0 0 0  Change in appetite 0 0 0  Feeling bad or failure about yourself  0 0 0  Trouble concentrating 0 0 0  Moving slowly or fidgety/restless 0 0 0  Suicidal thoughts 0 0 0  PHQ-9 Score 0 2 0  Difficult doing work/chores Not difficult at all Not difficult at all Not difficult at all    BP Readings from Last 3 Encounters:  02/12/23 114/82  01/05/23 137/79  10/11/22 126/80    Physical Exam HENT:     Right Ear: Tympanic membrane and ear canal normal.     Left Ear: Tympanic membrane and ear canal normal.     Nose: Nose normal. No congestion or rhinorrhea.     Mouth/Throat:     Mouth: Mucous membranes are moist.     Pharynx: No oropharyngeal exudate or posterior oropharyngeal erythema.  Eyes:     Pupils: Pupils are equal, round, and reactive to light.  Cardiovascular:     Rate and Rhythm: Normal rate.     Heart sounds: No murmur heard.    No friction rub. No  gallop.  Pulmonary:     Breath sounds: No stridor. No wheezing, rhonchi or rales.  Abdominal:     Tenderness: There is no abdominal tenderness. There is no guarding.     Wt Readings from Last 3 Encounters:  02/12/23 201 lb (91.2 kg)  01/05/23 205 lb 14.6 oz (93.4 kg)  10/11/22 206 lb (93.4 kg)    BP 114/82   Pulse 67   Ht 5\' 5"  (1.651 m)   Wt 201 lb (91.2 kg)   SpO2 99%   BMI 33.45 kg/m   Assessment and Plan: 1. Essential hypertension Chronic.  Controlled.  Stable.  Blood pressure is 114/82.  Blood pressure currently controlled on amlodipine 5 mg once a day and Toprol XL 50 mg once a day.  Patient is asymptomatic and will return in 6 months for recheck. - amLODipine (NORVASC) 5 MG tablet; Take 1 tablet (5 mg total) by mouth daily.  Dispense: 90 tablet; Refill: 0 - metoprolol succinate (TOPROL-XL) 50 MG 24 hr tablet; Take 1 tablet (50 mg total) by mouth daily. Take with or immediately following a meal.  Dispense: 90 tablet; Refill: 1  2. Gastroesophageal reflux disease, unspecified whether esophagitis present Chronic.  Controlled.  Stable.  Continue pantoprazole 40 mg once a day. - pantoprazole (PROTONIX) 40 MG tablet; Take 1 tablet (40 mg total) by mouth daily.  Dispense: 90 tablet; Refill: 0  3. Statin intolerance Chronic.  Controlled.  Patient unable to tolerate statin and we will continue with dietary control only.  Discussion about weight loss and that I currently am not prescribing the weight loss medications as currently available.  I have encouraged her if she needs to find someone to do so that I would concur and that understand that that this is a struggle and something that she needs.  Elizabeth Sauer, MD

## 2023-08-09 ENCOUNTER — Ambulatory Visit (INDEPENDENT_AMBULATORY_CARE_PROVIDER_SITE_OTHER): Payer: Medicare HMO | Admitting: Emergency Medicine

## 2023-08-09 VITALS — Ht 65.0 in | Wt 200.0 lb

## 2023-08-09 DIAGNOSIS — Z0000 Encounter for general adult medical examination without abnormal findings: Secondary | ICD-10-CM | POA: Diagnosis not present

## 2023-08-09 DIAGNOSIS — Z1231 Encounter for screening mammogram for malignant neoplasm of breast: Secondary | ICD-10-CM

## 2023-08-09 NOTE — Patient Instructions (Addendum)
 Lauren Leblanc , Thank you for taking time to come for your Medicare Wellness Visit. I appreciate your ongoing commitment to your health goals. Please review the following plan we discussed and let me know if I can assist you in the future.   Referrals/Orders/Follow-Ups/Clinician Recommendations: I have placed an order for a mammogram. Call [facility] @ [phone] to schedule at your earliest convenience. I have made you an appointment with Dr. Barnetta Liberty for 08/13/23 @ 1:40pm (arrive 15 min early)  This is a list of the screening recommended for you and due dates:  Health Maintenance  Topic Date Due   Mammogram  02/02/2022   COVID-19 Vaccine (7 - Pfizer risk 2024-25 season) 07/15/2023   Hepatitis C Screening  09/25/2023*   Flu Shot  11/23/2023   Medicare Annual Wellness Visit  08/08/2024   DEXA scan (bone density measurement)  09/01/2024   Colon Cancer Screening  07/30/2027   DTaP/Tdap/Td vaccine (3 - Td or Tdap) 11/02/2032   Pneumonia Vaccine  Completed   Zoster (Shingles) Vaccine  Completed   HPV Vaccine  Aged Out   Meningitis B Vaccine  Aged Out  *Topic was postponed. The date shown is not the original due date.    Advanced directives: (ACP Link)Information on Advanced Care Planning can be found at [place]  Secretary of [facility] Advance Health Care Directives Advance Health Care Directives. [email or web address] You may also get these forms at your doctor's office.  Once you have completed the forms, please bring a copy of your health care power of attorney and living will to the office to be added to your chart at your convenience.   Next Medicare Annual Wellness Visit scheduled for next year: Yes, 08/21/24 @ 11:20am (phone visit)  Fall Prevention in the Home, Adult Falls can cause injuries and affect people of all ages. There are many simple things that you can do to make your home safe and to help prevent falls. If you need it, ask for help making these changes. What  actions can I take to prevent falls? General information Use good lighting in all rooms. Make sure to: Replace any light bulbs that burn out. Turn on lights if it is dark and use night-lights. Keep items that you use often in easy-to-reach places. Lower the shelves around your home if needed. Move furniture so that there are clear paths around it. Do not keep throw rugs or other things on the floor that can make you trip. If any of your floors are uneven, fix them. Add color or contrast paint or tape to clearly mark and help you see: Grab bars or handrails. First and last steps of staircases. Where the edge of each step is. If you use a ladder or stepladder: Make sure that it is fully opened. Do not climb a closed ladder. Make sure the sides of the ladder are locked in place. Have someone hold the ladder while you use it. Know where your pets are as you move through your home. What can I do in the bathroom?     Keep the floor dry. Clean up any water that is on the floor right away. Remove soap buildup in the bathtub or shower. Buildup makes bathtubs and showers slippery. Use non-skid mats or decals on the floor of the bathtub or shower. Attach bath mats securely with double-sided, non-slip rug tape. If you need to sit down while you are in the shower, use a non-slip stool. Install grab bars by the toilet  and in the bathtub and shower. Do not use towel bars as grab bars. What can I do in the bedroom? Make sure that you have a light by your bed that is easy to reach. Do not use any sheets or blankets on your bed that hang to the floor. Have a firm bench or chair with side arms that you can use for support when you get dressed. What can I do in the kitchen? Clean up any spills right away. If you need to reach something above you, use a sturdy step stool that has a grab bar. Keep electrical cables out of the way. Do not use floor polish or wax that makes floors slippery. What can I  do with my stairs? Do not leave anything on the stairs. Make sure that you have a light switch at the top and the bottom of the stairs. Have them installed if you do not have them. Make sure that there are handrails on both sides of the stairs. Fix handrails that are broken or loose. Make sure that handrails are as long as the staircases. Install non-slip stair treads on all stairs in your home if they do not have carpet. Avoid having throw rugs at the top or bottom of stairs, or secure the rugs with carpet tape to prevent them from moving. Choose a carpet design that does not hide the edge of steps on the stairs. Make sure that carpet is firmly attached to the stairs. Fix any carpet that is loose or worn. What can I do on the outside of my home? Use bright outdoor lighting. Repair the edges of walkways and driveways and fix any cracks. Clear paths of anything that can make you trip, such as tools or rocks. Add color or contrast paint or tape to clearly mark and help you see high doorway thresholds. Trim any bushes or trees on the main path into your home. Check that handrails are securely fastened and in good repair. Both sides of all steps should have handrails. Install guardrails along the edges of any raised decks or porches. Have leaves, snow, and ice cleared regularly. Use sand, salt, or ice melt on walkways during winter months if you live where there is ice and snow. In the garage, clean up any spills right away, including grease or oil spills. What other actions can I take? Review your medicines with your health care provider. Some medicines can make you confused or feel dizzy. This can increase your chance of falling. Wear closed-toe shoes that fit well and support your feet. Wear shoes that have rubber soles and low heels. Use a cane, walker, scooter, or crutches that help you move around if needed. Talk with your provider about other ways that you can decrease your risk of falls. This  may include seeing a physical therapist to learn to do exercises to improve movement and strength. Where to find more information Centers for Disease Control and Prevention, [employer]: TonerPromos.no [employer] on Aging: BaseRingTones.pl National Institute on Aging: BaseRingTones.pl Contact a health care provider if: You are afraid of falling at home. You feel weak, drowsy, or dizzy at home. You fall at home. Get help right away if you: Lose consciousness or have trouble moving after a fall. Have a fall that causes a head injury. These symptoms may be an emergency. Get help right away. Call 911. Do not wait to see if the symptoms will go away. Do not drive yourself to the hospital. This information is not  intended to replace advice given to you by your health care provider. Make sure you discuss any questions you have with your health care provider. Document Revised: 12/12/2021 Document Reviewed: 12/12/2021 Elsevier Patient Education  2024 [employer].

## 2023-08-09 NOTE — Progress Notes (Signed)
 Subjective:   Lauren Leblanc is a 73 y.o. who presents for a Medicare Wellness preventive visit.  Visit Complete: Virtual I connected with  Lauren Leblanc on 08/09/23 by a audio enabled telemedicine application and verified that I am speaking with the correct person using two identifiers.  Patient Location: Home  Provider Location: Home Office  I discussed the limitations of evaluation and management by telemedicine. The patient expressed understanding and agreed to proceed.  Vital Signs: Because this visit was a virtual/telehealth visit, some criteria may be missing or patient reported. Any vitals not documented were not able to be obtained and vitals that have been documented are patient reported.  VideoDeclined- This patient declined Librarian, academic. Therefore the visit was completed with audio only.  Persons Participating in Visit: Patient.  AWV Questionnaire: Yes: Patient Medicare AWV questionnaire was completed by the patient on 07/29/23; I have confirmed that all information answered by patient is correct and no changes since this date.  Cardiac Risk Factors include: advanced age (>68men, >45 women);dyslipidemia;hypertension;obesity (BMI >30kg/m2)     Objective:    Today's Vitals   08/09/23 1119  Weight: 200 lb (90.7 kg)  Height: 5\' 5"  (1.651 m)   Body mass index is 33.28 kg/m.     08/09/2023   11:35 AM 01/05/2023    3:17 PM 08/03/2022   11:06 AM 06/19/2022    5:37 PM 02/13/2022   11:18 AM 08/13/2021    6:42 PM 07/25/2021    9:41 AM  Advanced Directives  Does Patient Have a Medical Advance Directive? No No No No No No No  Would patient like information on creating a medical advance directive? Yes (MAU/Ambulatory/Procedural Areas - Information given)  No - Patient declined    Yes (MAU/Ambulatory/Procedural Areas - Information given)    Current Medications (verified) Outpatient Encounter Medications as of 08/09/2023  Medication  Sig   amLODipine (NORVASC) 5 MG tablet Take 1 tablet (5 mg total) by mouth daily.   Cholecalciferol (VITAMIN D-3 PO) Take 1 tablet by mouth daily.   Docusate Sodium (COLACE PO) Take 1 tablet by mouth daily.   metoprolol succinate (TOPROL-XL) 50 MG 24 hr tablet Take 1 tablet (50 mg total) by mouth daily. Take with or immediately following a meal.   Multiple Vitamins-Minerals (ONE-A-DAY WOMENS 50 PLUS PO) Take 1 tablet by mouth daily.   nitroGLYCERIN (NITROSTAT) 0.4 MG SL tablet Place 1 tablet (0.4 mg total) under the tongue every 5 (five) minutes as needed for chest pain.   pantoprazole (PROTONIX) 40 MG tablet Take 1 tablet (40 mg total) by mouth daily.   ibuprofen (ADVIL) 600 MG tablet Take 1 tablet (600 mg total) by mouth every 6 (six) hours as needed. (Patient not taking: Reported on 08/09/2023)   Lactobacillus-Inulin (CULTURELLE DIGESTIVE HEALTH PO) Take 1 capsule by mouth daily. (Patient not taking: Reported on 08/09/2023)   OVER THE COUNTER MEDICATION Take 2 each by mouth daily. Neuriva (Patient not taking: Reported on 08/09/2023)   No facility-administered encounter medications on file as of 08/09/2023.    Allergies (verified) Lisinopril, Codeine, and Hydrochlorothiazide   History: Past Medical History:  Diagnosis Date   Angina at rest [facility])    Arthritis    GERD (gastroesophageal reflux disease)    Hypertension    Past Surgical History:  Procedure Laterality Date   BACK SURGERY  2014   CYST REMOVAL   BREAST BIOPSY Bilateral    neg   DILATION AND CURETTAGE OF UTERUS  TOTAL KNEE ARTHROPLASTY Left 04/13/2020   Procedure: TOTAL KNEE ARTHROPLASTY;  Surgeon: Christena Flake, MD;  Location: [facility] ORS;  Service: Orthopedics;  Laterality: Left;   TUBAL LIGATION     Family History  Problem Relation Age of Onset   Coronary artery disease Mother 52   Hypertension Mother    Stroke Father    Diabetes Father    Hypertension Sister    Allergies Sister    Breast cancer Maternal Aunt         3 mat aunts   Breast cancer Maternal Aunt    Breast cancer Maternal Aunt    Social History   Socioeconomic History   Marital status: Married    Spouse name: Thurmond Butts   Number of children: 3   Years of education: Not on file   Highest education level: Associate degree: academic program  Occupational History   Occupation: working - Psychologist, sport and exercise    Comment: [employer]   Occupation: retired    Start: 08/2021  Tobacco Use   Smoking status: Never    Passive exposure: Past   Smokeless tobacco: Never  Vaping Use   Vaping status: Never Used  Substance and Sexual Activity   Alcohol use: No    Alcohol/week: 0.0 standard drinks of alcohol   Drug use: No   Sexual activity: Not Currently  Other Topics Concern   Not on file  Social History Narrative   3 children, 2 living and 1 deceased   Social Drivers of Health   Financial Resource Strain: Low Risk  (08/09/2023)   Overall Financial Resource Strain (CARDIA)    Difficulty of Paying Living Expenses: Not hard at all  Food Insecurity: No Food Insecurity (08/09/2023)   Hunger Vital Sign    Worried About Running Out of Food in the Last Year: Never true    Ran Out of Food in the Last Year: Never true  Transportation Needs: No Transportation Needs (08/09/2023)   PRAPARE - Administrator, Civil Service (Medical): No    Lack of Transportation (Non-Medical): No  Physical Activity: Insufficiently Active (08/09/2023)   Exercise Vital Sign    Days of Exercise per Week: 3 days    Minutes of Exercise per Session: 30 min  Stress: Stress Concern Present (08/09/2023)   [employer] of Occupational Health - Occupational Stress Questionnaire    Feeling of Stress : To some extent  Social Connections: Moderately Integrated (08/09/2023)   Social Connection and Isolation Panel [NHANES]    Frequency of Communication with Friends and Family: More than three times a week    Frequency of Social Gatherings with Friends and Family: Twice  a week    Attends Religious Services: 1 to 4 times per year    Active Member of [employer] or Organizations: No    Attends Engineer, structural: Never    Marital Status: Married    Tobacco Counseling Counseling given: Not Answered    Clinical Intake:  Pre-visit preparation completed: Yes  Pain : No/denies pain     BMI - recorded: 33.28 Nutritional Status: BMI > 30  Obese Nutritional Risks: None Diabetes: No  No results found for: "HGBA1C"   How often do you need to have someone help you when you read instructions, pamphlets, or other written materials from your doctor or pharmacy?: 1 - Never  Interpreter Needed?: No  Information entered by :: Tora Kindred, CMA   Activities of Daily Living      08/09/2023  11:21 AM 08/08/2023   11:06 AM  In your present state of health, do you have any difficulty performing the following activities:  Hearing? 0 0  Vision? 0 0  Difficulty concentrating or making decisions? 0 0  Walking or climbing stairs? 0 0  Dressing or bathing? 0 0  Doing errands, shopping? 0 0  Preparing Food and eating ? N N  Using the Toilet? N N  In the past six months, have you accidently leaked urine? Malvin Johns  Comment wears a pad   Do you have problems with loss of bowel control? N N  Managing your Medications? N N  Managing your Finances? N N  Housekeeping or managing your Housekeeping? N N    Patient Care Team: Duanne Limerick, MD as PCP - General (Family Medicine)  Indicate any recent Medical Services you may have received from other than [facility] providers in the past year (date may be approximate).     Assessment:   This is a routine wellness examination for Lauren Leblanc.  Hearing/Vision screen Hearing Screening - Comments:: Denies hearing loss Vision Screening - Comments:: Needs eye exam. Pt will call to schedule appointment at [facility] who is contracted with her insurance company   Goals Addressed               This Visit's  Progress     Weight (lb) < 150 lb (68 kg) (pt-stated)   200 lb (90.7 kg)      Depression Screen     08/09/2023   11:30 AM 02/12/2023    2:41 PM 10/11/2022    9:39 AM 09/25/2022    2:39 PM 08/04/2022    1:10 PM 08/03/2022   11:03 AM 06/26/2022    2:18 PM  PHQ 2/9 Scores  PHQ - 2 Score 1 0 0 0 0 1 0  PHQ- 9 Score 2 0 2 0 0 1 0    Fall Risk     08/09/2023   11:38 AM 08/08/2023   11:06 AM 02/12/2023    2:42 PM 10/11/2022    9:39 AM 09/25/2022    2:38 PM  Fall Risk   Falls in the past year? 1 1 1  0 0  Number falls in past yr: 1 1 0 0 0  Injury with Fall? 0 0 0 0 0  Risk for fall due to : History of fall(s);Impaired balance/gait;Orthopedic patient  No Fall Risks No Fall Risks No Fall Risks  Follow up Falls prevention discussed;Falls evaluation completed;Education provided  Falls evaluation completed Falls evaluation completed Falls evaluation completed    MEDICARE RISK AT HOME:  Medicare Risk at Home Any stairs in or around the home?: Yes If so, are there any without handrails?: No Home free of loose throw rugs in walkways, pet beds, electrical cords, etc?: Yes Adequate lighting in your home to reduce risk of falls?: Yes Life alert?: No Use of a cane, walker or w/c?: No Grab bars in the bathroom?: No Shower chair or bench in shower?: No Elevated toilet seat or a handicapped toilet?: Yes  TIMED UP AND GO:  Was the test performed?  No  Cognitive Function: 6CIT completed        08/09/2023   11:40 AM 08/03/2022   11:14 AM 07/16/2019    9:43 AM  6CIT Screen  What Year? 0 points 0 points 0 points  What month? 0 points 0 points 0 points  What time? 0 points 0 points 0 points  Count back  from 20 2 points 0 points 0 points  Months in reverse 0 points 0 points 0 points  Repeat phrase 0 points 0 points 0 points  Total Score 2 points 0 points 0 points    Immunizations Immunization History  Administered Date(s) Administered   Fluad Quad(high Dose 65+) 02/02/2021, 12/29/2021    Influenza, High Dose Seasonal PF 02/01/2018   Influenza, Seasonal, Injecte, Preservative Fre 05/10/2012   Influenza,inj,Quad PF,6+ Mos 02/01/2017   Influenza-Unspecified 02/06/2020, 01/15/2023   PFIZER(Purple Top)SARS-COV-2 Vaccination 06/15/2019, 07/09/2019, 01/22/2020, 10/06/2020, 04/04/2021   PNEUMOCOCCAL CONJUGATE-20 02/03/2022   Pfizer(Comirnaty)Fall Seasonal Vaccine 12 years and older 01/15/2023   Pneumococcal Conjugate-13 02/01/2018   Pneumococcal Polysaccharide-23 07/21/2020   Rabies, IM 05/29/2022   Td 11/03/2022   Tdap 12/06/2018   Zoster Recombinant(Shingrix) 11/03/2022, 01/04/2023    Screening Tests Health Maintenance  Topic Date Due   MAMMOGRAM  02/02/2022   COVID-19 Vaccine (7 - Pfizer risk 2024-25 season) 07/15/2023   Hepatitis C Screening  09/25/2023 (Originally 07/18/1968)   INFLUENZA VACCINE  11/23/2023   Medicare Annual Wellness (AWV)  08/08/2024   DEXA SCAN  09/01/2024   Colonoscopy  07/30/2027   DTaP/Tdap/Td (3 - Td or Tdap) 11/02/2032   Pneumonia Vaccine 53+ Years old  Completed   Zoster Vaccines- Shingrix  Completed   HPV VACCINES  Aged Out   Meningococcal B Vaccine  Aged Out    Health Maintenance  Health Maintenance Due  Topic Date Due   MAMMOGRAM  02/02/2022   COVID-19 Vaccine (7 - Pfizer risk 2024-25 season) 07/15/2023   Health Maintenance Items Addressed: Mammogram ordered  Additional Screening:  Vision Screening: Recommended annual ophthalmology exams for early detection of glaucoma and other disorders of the eye.  Dental Screening: Recommended annual dental exams for proper oral hygiene  Community Resource Referral / Chronic Care Management: CRR required this visit?  No   CCM required this visit?  No     Plan:     I have personally reviewed and noted the following in the patient's chart:   Medical and social history Use of alcohol, tobacco or illicit drugs  Current medications and supplements including opioid prescriptions.  Patient is not currently taking opioid prescriptions. Functional ability and status Nutritional status Physical activity Advanced directives List of other physicians Hospitalizations, surgeries, and ER visits in previous 12 months Vitals Screenings to include cognitive, depression, and falls Referrals and appointments  In addition, I have reviewed and discussed with patient certain preventive protocols, quality metrics, and best practice recommendations. A written personalized care plan for preventive services as well as general preventive health recommendations were provided to patient.     Lauren Leblanc, CMA   08/09/2023   After Visit Summary: (MyChart) Due to this being a telephonic visit, the after visit summary with patients personalized plan was offered to patient via MyChart   Notes:  6 CIT Score - 2 Placed order for MMG (past due) Schedule patient a 6 mth f/u for HTN, HLD with Dr. Barnetta Liberty on 08/13/23

## 2023-08-13 ENCOUNTER — Ambulatory Visit (INDEPENDENT_AMBULATORY_CARE_PROVIDER_SITE_OTHER): Admitting: Student

## 2023-08-13 ENCOUNTER — Encounter: Payer: Self-pay | Admitting: Student

## 2023-08-13 VITALS — BP 138/88 | HR 62 | Ht 65.0 in | Wt 204.0 lb

## 2023-08-13 DIAGNOSIS — R109 Unspecified abdominal pain: Secondary | ICD-10-CM | POA: Insufficient documentation

## 2023-08-13 DIAGNOSIS — E66811 Obesity, class 1: Secondary | ICD-10-CM

## 2023-08-13 DIAGNOSIS — I10 Essential (primary) hypertension: Secondary | ICD-10-CM | POA: Diagnosis not present

## 2023-08-13 DIAGNOSIS — R1030 Lower abdominal pain, unspecified: Secondary | ICD-10-CM | POA: Diagnosis not present

## 2023-08-13 DIAGNOSIS — Z0000 Encounter for general adult medical examination without abnormal findings: Secondary | ICD-10-CM | POA: Insufficient documentation

## 2023-08-13 DIAGNOSIS — E782 Mixed hyperlipidemia: Secondary | ICD-10-CM | POA: Diagnosis not present

## 2023-08-13 NOTE — Assessment & Plan Note (Signed)
 Intermittent low abdominal pain for the past few months. Is taking colace as needed. Well appearing today with stable vitals. Discussed increase fluid and dietary fiber intake. CMP today. Follow in 1 month if not improving.

## 2023-08-13 NOTE — Patient Instructions (Signed)
 Please call to schedule your mammogram [phone]

## 2023-08-13 NOTE — Assessment & Plan Note (Signed)
 Current medications are amlodipine  5 mg daily and metoprolol  succinate 50 mg daily. BP controlled, mildly elevated may be due to stomach discomfort. Continue current medications.   CMP today.

## 2023-08-13 NOTE — Assessment & Plan Note (Signed)
 Lipid panel today

## 2023-08-13 NOTE — Assessment & Plan Note (Signed)
 Discussed keeping log of food, focus on eating more lean protein like fish and fewer carbohydrates.

## 2023-08-13 NOTE — Progress Notes (Signed)
 Established Patient Office Visit  Subjective   Patient ID: Lauren Leblanc, female    DOB: 11/16/1950  Age: 73 y.o. MRN: [id number]  Chief Complaint  Patient presents with   Medical Management of Chronic Issues    Patient presents today for a follow up on her HTN and HLD. She has been taking her medications as directed. She is not feeling well today and having stomach pain.     Hypertension Doing well on medication has been taking garlic and beet supplements. She denies chest pain, dyspnea, HA, palpitations, edema, or visual changes.  Hyperlipidemia Unable to tolerate statins, working on dietary control. Reports extreme on lipid lowing medications.   Weight Difficulty losing weight. [place] and reports she is very active taking care of chores as husband is unable to help much with this. Thinks she not eating much, reports she was unable to afford changing her diet much.   Abdominal pain Lower pain for the last few months off and on pain lasts several hours. Pain is diffusely in the lower abdomen. Is not sure how often this happens. Take colace and having daily Bms. Does note bloating. Denies constipation, diarrhea, nausea, vomiting, melena, hematochezia, dysuria.   Patient Active Problem List   Diagnosis Date Noted   Annual physical exam 08/13/2023   Abdominal pain 08/13/2023   Degenerative disc disease, lumbar 10/16/2022   Statin intolerance 02/06/2022   Spondylolisthesis of lumbar region 06/18/2020   Sacroiliitis (HCC) 06/18/2020   Status post total knee replacement using cement, left 04/13/2020   Esophageal dysphagia 07/04/2019   Familial hypercholesterolemia 07/04/2019   Gastroesophageal reflux disease 07/04/2019   Obesity (BMI 30.0-34.9) 03/13/2018   Primary osteoarthritis of left knee 03/13/2018   Hyperlipidemia, mixed 04/26/2017   Chest pain 04/24/2017   Essential hypertension 01/06/2016      ROS Refer to HPI    Objective:     BP 138/88    Pulse 62   Ht 5\' 5"  (1.651 m)   Wt 204 lb (92.5 kg)   SpO2 97%   BMI 33.95 kg/m  BP Readings from Last 3 Encounters:  08/13/23 138/88  02/12/23 114/82  01/05/23 137/79    Physical Exam Constitutional:      Appearance: Normal appearance.  HENT:     Mouth/Throat:     Mouth: Mucous membranes are moist.     Pharynx: Oropharynx is clear.  Cardiovascular:     Rate and Rhythm: Normal rate and regular rhythm.  Pulmonary:     Effort: Pulmonary effort is normal.     Breath sounds: No rhonchi or rales.  Abdominal:     General: Abdomen is flat. Bowel sounds are normal. There is no distension.     Palpations: Abdomen is soft. There is no mass.     Tenderness: There is no guarding or rebound.     Comments: No ttp however diffuse pain across lower abdomen. No fluid wave  Musculoskeletal:        General: Normal range of motion.     Right lower leg: No edema.     Left lower leg: No edema.  Skin:    General: Skin is warm and dry.     Capillary Refill: Capillary refill takes less than 2 seconds.  Neurological:     General: No focal deficit present.     Mental Status: She is alert and oriented to person, place, and time.  Psychiatric:        Mood and Affect: Mood  normal.        Behavior: Behavior normal.        08/13/2023    1:38 PM 08/09/2023   11:30 AM 02/12/2023    2:41 PM  Depression screen PHQ 2/9  Decreased Interest 0 0 0  Down, Depressed, Hopeless 0 1 0  PHQ - 2 Score 0 1 0  Altered sleeping 2 0 0  Tired, decreased energy 2 1 0  Change in appetite 0 0 0  Feeling bad or failure about yourself  0 0 0  Trouble concentrating 0 0 0  Moving slowly or fidgety/restless 0 0 0  Suicidal thoughts 0 0 0  PHQ-9 Score 4 2 0  Difficult doing work/chores Not difficult at all Not difficult at all Not difficult at all       08/13/2023    1:38 PM 02/12/2023    2:41 PM 10/11/2022    9:39 AM 09/25/2022    2:39 PM  GAD 7 : Generalized Anxiety Score  Nervous, Anxious, on Edge 1 0 0 0   Control/stop worrying 0 0 0 0  Worry too much - different things 0 0 0 0  Trouble relaxing 0 0 0 0  Restless 0 0 0 0  Easily annoyed or irritable 0 0 0 0  Afraid - awful might happen 0 0 0 0  Total GAD 7 Score 1 0 0 0  Anxiety Difficulty Not difficult at all Not difficult at all Not difficult at all Not difficult at all    No results found for any visits on 08/13/23.  Last CBC Lab Results  Component Value Date   WBC 8.4 10/11/2022   HGB 11.9 10/11/2022   HCT 36.4 10/11/2022   MCV 89 10/11/2022   MCH 29.1 10/11/2022   RDW 13.8 10/11/2022   PLT 252 10/11/2022   Last metabolic panel Lab Results  Component Value Date   GLUCOSE 91 08/04/2022   NA 143 08/04/2022   K 4.6 08/04/2022   CL 103 08/04/2022   CO2 24 08/04/2022   BUN 17 08/04/2022   CREATININE 0.92 08/04/2022   EGFR 67 08/04/2022   CALCIUM  9.7 08/04/2022   PHOS 3.9 08/03/2021   PROT 6.6 08/04/2022   ALBUMIN 4.5 08/04/2022   LABGLOB 2.1 08/04/2022   AGRATIO 2.1 08/04/2022   BILITOT 0.3 08/04/2022   ALKPHOS 93 08/04/2022   AST 21 08/04/2022   ALT 17 08/04/2022   ANIONGAP 10 12/27/2021   Last lipids Lab Results  Component Value Date   CHOL 207 (H) 08/04/2022   HDL 49 08/04/2022   LDLCALC 124 (H) 08/04/2022   LDLDIRECT 122 (H) 02/03/2022   TRIG 191 (H) 08/04/2022   CHOLHDL 5.3 04/25/2017   Last thyroid  functions Lab Results  Component Value Date   TSH 1.359 12/11/2018      The 10-year ASCVD risk score (Arnett DK, et al., 2019) is: 16.5%    Assessment & Plan:  Hyperlipidemia, mixed Assessment & Plan: Lipid panel today.  Orders: -     Lipid panel  Essential hypertension Assessment & Plan: Current medications are amlodipine  5 mg daily and metoprolol  succinate 50 mg daily. BP controlled, mildly elevated may be due to stomach discomfort. Continue current medications.   CMP today.   Orders: -     Comprehensive metabolic panel with GFR  Annual physical exam Assessment & Plan: Bloodwork  ordered. Mammo was ordered at [facility].   Orders: -     Hemoglobin A1c -  TSH -     CBC with Differential/Platelet  Obesity (BMI 30.0-34.9) Assessment & Plan: Discussed keeping log of food, focus on eating more lean protein like fish and fewer carbohydrates.    Lower abdominal pain Assessment & Plan: Intermittent low abdominal pain for the past few months. Is taking colace as needed. Well appearing today with stable vitals. Discussed increase fluid and dietary fiber intake. CMP today. Follow in 1 month if not improving.       Return in about 6 months (around 02/12/2024).    Barnetta Liberty, MD

## 2023-08-13 NOTE — Addendum Note (Signed)
 Addended by: Barnetta Liberty on: 08/13/2023 04:04 PM   Modules accepted: Level of Service

## 2023-08-13 NOTE — Assessment & Plan Note (Signed)
 Bloodwork ordered. Mammo was ordered at [facility].

## 2023-08-14 ENCOUNTER — Encounter: Payer: Self-pay | Admitting: Student

## 2023-08-14 LAB — LIPID PANEL
Chol/HDL Ratio: 4.1 ratio (ref 0.0–4.4)
Cholesterol, Total: 191 mg/dL (ref 100–199)
HDL: 47 mg/dL (ref >39)
LDL Chol Calc (NIH): 103 mg/dL — ABNORMAL HIGH (ref 0–99)
Triglycerides: 243 mg/dL — ABNORMAL HIGH (ref 0–149)
VLDL Cholesterol Cal: 41 mg/dL — ABNORMAL HIGH (ref 5–40)

## 2023-08-14 LAB — COMPREHENSIVE METABOLIC PANEL WITH GFR
ALT: 14 IU/L (ref 0–32)
AST: 19 IU/L (ref 0–40)
Albumin: 4.5 g/dL (ref 3.8–4.8)
Alkaline Phosphatase: 92 IU/L (ref 44–121)
BUN/Creatinine Ratio: 18 (ref 12–28)
BUN: 18 mg/dL (ref 8–27)
Bilirubin Total: 0.2 mg/dL (ref 0.0–1.2)
CO2: 23 mmol/L (ref 20–29)
Calcium: 9.4 mg/dL (ref 8.7–10.3)
Chloride: 106 mmol/L (ref 96–106)
Creatinine, Ser: 1 mg/dL (ref 0.57–1.00)
Globulin, Total: 2 g/dL (ref 1.5–4.5)
Glucose: 85 mg/dL (ref 70–99)
Potassium: 4.6 mmol/L (ref 3.5–5.2)
Sodium: 144 mmol/L (ref 134–144)
Total Protein: 6.5 g/dL (ref 6.0–8.5)
eGFR: 60 mL/min/{1.73_m2} (ref >59)

## 2023-08-14 LAB — CBC WITH DIFFERENTIAL/PLATELET
Basophils Absolute: 0 10*3/uL (ref 0.0–0.2)
Basos: 1 %
EOS (ABSOLUTE): 0.2 10*3/uL (ref 0.0–0.4)
Eos: 3 %
Hematocrit: 35.7 % (ref 34.0–46.6)
Hemoglobin: 11.8 g/dL (ref 11.1–15.9)
Immature Grans (Abs): 0 10*3/uL (ref 0.0–0.1)
Immature Granulocytes: 0 %
Lymphocytes Absolute: 2.3 10*3/uL (ref 0.7–3.1)
Lymphs: 34 %
MCH: 29.1 pg (ref 26.6–33.0)
MCHC: 33.1 g/dL (ref 31.5–35.7)
MCV: 88 fL (ref 79–97)
Monocytes Absolute: 0.3 10*3/uL (ref 0.1–0.9)
Monocytes: 5 %
Neutrophils Absolute: 3.8 10*3/uL (ref 1.4–7.0)
Neutrophils: 57 %
Platelets: 250 10*3/uL (ref 150–450)
RBC: 4.06 x10E6/uL (ref 3.77–5.28)
RDW: 13.1 % (ref 11.7–15.4)
WBC: 6.7 10*3/uL (ref 3.4–10.8)

## 2023-08-14 LAB — HEMOGLOBIN A1C
Est. average glucose Bld gHb Est-mCnc: 114 mg/dL
Hgb A1c MFr Bld: 5.6 % (ref 4.8–5.6)

## 2023-08-14 LAB — TSH: TSH: 3.95 u[IU]/mL (ref 0.450–4.500)

## 2023-08-19 ENCOUNTER — Other Ambulatory Visit: Payer: Self-pay | Admitting: Family Medicine

## 2023-08-19 DIAGNOSIS — I10 Essential (primary) hypertension: Secondary | ICD-10-CM

## 2023-08-28 ENCOUNTER — Ambulatory Visit
Admission: RE | Admit: 2023-08-28 | Discharge: 2023-08-28 | Disposition: A | Discharge status: Home / Self Care | Source: Ambulatory Visit | Attending: Family Medicine | Admitting: Family Medicine

## 2023-08-28 DIAGNOSIS — Z1231 Encounter for screening mammogram for malignant neoplasm of breast: Secondary | ICD-10-CM | POA: Diagnosis not present

## 2023-09-19 ENCOUNTER — Other Ambulatory Visit: Payer: Self-pay | Admitting: Family Medicine

## 2023-09-19 DIAGNOSIS — I10 Essential (primary) hypertension: Secondary | ICD-10-CM

## 2023-10-09 DIAGNOSIS — H5203 Hypermetropia, bilateral: Secondary | ICD-10-CM | POA: Diagnosis not present

## 2023-10-10 DIAGNOSIS — Z0100 Encounter for examination of eyes and vision without abnormal findings: Secondary | ICD-10-CM | POA: Diagnosis not present

## 2023-10-15 DIAGNOSIS — L300 Nummular dermatitis: Secondary | ICD-10-CM | POA: Diagnosis not present

## 2023-10-16 ENCOUNTER — Other Ambulatory Visit: Payer: Self-pay

## 2023-10-16 DIAGNOSIS — K219 Gastro-esophageal reflux disease without esophagitis: Secondary | ICD-10-CM

## 2023-10-16 MED ORDER — PANTOPRAZOLE SODIUM 40 MG PO TBEC: 40.0000 mg | DELAYED_RELEASE_TABLET | Freq: Every day | ORAL | 1 refills | Status: DC

## 2023-11-26 DIAGNOSIS — L300 Nummular dermatitis: Secondary | ICD-10-CM | POA: Diagnosis not present

## 2023-12-20 ENCOUNTER — Other Ambulatory Visit: Payer: Self-pay | Admitting: Student

## 2023-12-20 DIAGNOSIS — I10 Essential (primary) hypertension: Secondary | ICD-10-CM

## 2023-12-20 MED ORDER — AMLODIPINE BESYLATE 5 MG PO TABS: 5.0000 mg | ORAL_TABLET | Freq: Every day | ORAL | 1 refills | Status: AC

## 2023-12-20 MED ORDER — METOPROLOL SUCCINATE ER 50 MG PO TB24: 50.0000 mg | ORAL_TABLET | Freq: Every day | ORAL | 1 refills | Status: DC

## 2023-12-20 NOTE — Telephone Encounter (Signed)
 FYI Only or Action Required?: Action required by provider: medication refill request.  Patient was last seen in primary care on 08/13/2023 by Lemon Raisin, MD.  Called Nurse Triage reporting No chief complaint on file..  Symptoms began today.  Interventions attempted: Nothing.  Symptoms are: stable.  Triage Disposition: No disposition on file.  Patient/caregiver understands and will follow disposition?:

## 2023-12-20 NOTE — Telephone Encounter (Signed)
 Not our patient

## 2023-12-20 NOTE — Telephone Encounter (Signed)
 Copied from CRM [id number]. Topic: Clinical - Medication Refill >> Dec 20, 2023 10:43 AM Montie POUR wrote: Medication:  metoprolol  succinate (TOPROL -XL) 50 MG 24 hr tablet  amLODipine  (NORVASC ) 5 MG tablet  Has the patient contacted their pharmacy? Yes (Agent: If no, request that the patient contact the pharmacy for the refill. If patient does not wish to contact the pharmacy document the reason why and proceed with request.) (Agent: If yes, when and what did the pharmacy advise?) Pharmacy needs order to refill  This is the patient's preferred pharmacy:  [facility] [address] [place] - [address] [place] [address] Phone: [phone] Fax: [phone]   Is this the correct pharmacy for this prescription? Yes If no, delete pharmacy and type the correct one.   Has the prescription been filled recently? No  Is the patient out of the medication? Yes  Has the patient been seen for an appointment in the last year OR does the patient have an upcoming appointment? Yes  Can we respond through MyChart? Yes  Agent: Please be advised that Rx refills may take up to 3 business days. We ask that you follow-up with your pharmacy.

## 2024-02-12 ENCOUNTER — Ambulatory Visit: Admitting: Student

## 2024-02-12 VITALS — BP 136/80 | HR 67 | Ht 65.0 in | Wt 208.0 lb

## 2024-02-12 DIAGNOSIS — R0609 Other forms of dyspnea: Secondary | ICD-10-CM | POA: Diagnosis not present

## 2024-02-12 DIAGNOSIS — E66811 Obesity, class 1: Secondary | ICD-10-CM

## 2024-02-12 DIAGNOSIS — I10 Essential (primary) hypertension: Secondary | ICD-10-CM | POA: Diagnosis not present

## 2024-02-12 DIAGNOSIS — K219 Gastro-esophageal reflux disease without esophagitis: Secondary | ICD-10-CM

## 2024-02-12 DIAGNOSIS — R1319 Other dysphagia: Secondary | ICD-10-CM

## 2024-02-12 MED ORDER — METOPROLOL SUCCINATE ER 25 MG PO TB24: 25.0000 mg | ORAL_TABLET | Freq: Every day | ORAL | 1 refills | Status: DC

## 2024-02-12 NOTE — Assessment & Plan Note (Deleted)
 Is taking pantoprazole  40 mg daily, feels chewing gum helps with this more.

## 2024-02-12 NOTE — Assessment & Plan Note (Signed)
 Is taking pantoprazole  40 mg daily, feels chewing gum helps with this more.

## 2024-02-12 NOTE — Progress Notes (Unsigned)
 Established Patient Office Visit  Subjective   Patient ID: Lauren Leblanc, female    DOB: 02-Feb-1951  Age: 73 y.o. MRN: [id number]  Chief Complaint  Patient presents with   Hyperlipidemia   Hypertension    Adrien LITTIE Jeangilles with medical hx listed below presents today for transfer of care. Please refer to problem based charting for further details and assessment and plan of current problem and chronic medical conditions.  Patient Active Problem List   Diagnosis Date Noted   Dyspnea on exertion 02/15/2024   Annual physical exam 08/13/2023   Abdominal pain 08/13/2023   Statin intolerance 02/06/2022   Spondylolisthesis of lumbar region 06/18/2020   Sacroiliitis 06/18/2020   Status post total knee replacement using cement, left 04/13/2020   Gastroesophageal reflux disease 07/04/2019   Obesity (BMI 30.0-34.9) 03/13/2018   Primary osteoarthritis of left knee 03/13/2018   Hyperlipidemia, mixed 04/26/2017   Essential hypertension 01/06/2016   Past Medical History:  Diagnosis Date   Angina at rest    Arthritis    GERD (gastroesophageal reflux disease)    Hypertension       ROS Refer to HPI    Objective:     Outpatient Encounter Medications as of 02/12/2024  Medication Sig   amLODipine  (NORVASC ) 5 MG tablet Take 1 tablet (5 mg total) by mouth daily.   Cholecalciferol  (VITAMIN D -3 PO) Take 1 tablet by mouth daily.   Docusate Sodium  (COLACE PO) Take 1 tablet by mouth daily.   metoprolol  succinate (TOPROL -XL) 25 MG 24 hr tablet Take 1 tablet (25 mg total) by mouth daily.   Multiple Vitamins-Minerals (ONE-A-DAY WOMENS 50 PLUS PO) Take 1 tablet by mouth daily.   pantoprazole  (PROTONIX ) 40 MG tablet Take 1 tablet (40 mg total) by mouth daily.   triamcinolone  cream (KENALOG ) 0.1 % Apply 1 Application topically 2 (two) times daily.   [DISCONTINUED] metoprolol  succinate (TOPROL -XL) 50 MG 24 hr tablet Take 1 tablet (50 mg total) by mouth daily. Take with or immediately  following a meal.   No facility-administered encounter medications on file as of 02/12/2024.    BP 136/80   Pulse 67   Ht 5' 5 (1.651 m)   Wt 208 lb (94.3 kg)   SpO2 96%   BMI 34.61 kg/m  BP Readings from Last 3 Encounters:  02/12/24 136/80  08/13/23 138/88  02/12/23 114/82    Physical Exam Constitutional:      Appearance: Normal appearance.  HENT:     Head: Normocephalic and atraumatic.     Mouth/Throat:     Mouth: Mucous membranes are moist.     Pharynx: Oropharynx is clear.  Eyes:     Extraocular Movements: Extraocular movements intact.     Pupils: Pupils are equal, round, and reactive to light.  Cardiovascular:     Rate and Rhythm: Normal rate and regular rhythm.     Pulses: Normal pulses.     Heart sounds: No murmur heard.    Comments: No JVD Pulmonary:     Effort: Pulmonary effort is normal.     Breath sounds: No rhonchi or rales.  Abdominal:     General: Abdomen is flat. Bowel sounds are normal. There is no distension.     Palpations: Abdomen is soft.     Tenderness: There is no abdominal tenderness.  Musculoskeletal:        General: Normal range of motion.     Right lower leg: No edema.     Left lower leg: No  edema.  Skin:    General: Skin is warm and dry.     Capillary Refill: Capillary refill takes less than 2 seconds.  Neurological:     General: No focal deficit present.     Mental Status: She is alert and oriented to person, place, and time.  Psychiatric:        Mood and Affect: Mood normal.        Behavior: Behavior normal.        02/12/2024    2:38 PM 08/13/2023    1:38 PM 08/09/2023   11:30 AM  Depression screen PHQ 2/9  Decreased Interest 0 0 0  Down, Depressed, Hopeless 0 0 1  PHQ - 2 Score 0 0 1  Altered sleeping 0 2 0  Tired, decreased energy 1 2 1   Change in appetite 0 0 0  Feeling bad or failure about yourself  0 0 0  Trouble concentrating 0 0 0  Moving slowly or fidgety/restless 0 0 0  Suicidal thoughts 0 0 0  PHQ-9 Score 1  4 2   Difficult doing work/chores Not difficult at all Not difficult at all Not difficult at all       02/12/2024    2:38 PM 08/13/2023    1:38 PM 02/12/2023    2:41 PM 10/11/2022    9:39 AM  GAD 7 : Generalized Anxiety Score  Nervous, Anxious, on Edge 0 1 0 0  Control/stop worrying 0 0 0 0  Worry too much - different things 0 0 0 0  Trouble relaxing 0 0 0 0  Restless 0 0 0 0  Easily annoyed or irritable 0 0 0 0  Afraid - awful might happen 0 0 0 0  Total GAD 7 Score 0 1 0 0  Anxiety Difficulty Not difficult at all Not difficult at all Not difficult at all Not difficult at all    Results for orders placed or performed in visit on 02/12/24  CBC with Differential/Platelet  Result Value Ref Range   WBC 6.0 3.4 - 10.8 x10E3/uL   RBC 4.15 3.77 - 5.28 x10E6/uL   Hemoglobin 12.2 11.1 - 15.9 g/dL   Hematocrit 62.3 65.9 - 46.6 %   MCV 91 79 - 97 fL   MCH 29.4 26.6 - 33.0 pg   MCHC 32.4 31.5 - 35.7 g/dL   RDW 86.6 88.2 - 84.5 %   Platelets 283 150 - 450 x10E3/uL   Neutrophils 59 Not Estab. %   Lymphs 31 Not Estab. %   Monocytes 6 Not Estab. %   Eos 3 Not Estab. %   Basos 1 Not Estab. %   Neutrophils Absolute 3.6 1.4 - 7.0 x10E3/uL   Lymphocytes Absolute 1.9 0.7 - 3.1 x10E3/uL   Monocytes Absolute 0.3 0.1 - 0.9 x10E3/uL   EOS (ABSOLUTE) 0.2 0.0 - 0.4 x10E3/uL   Basophils Absolute 0.0 0.0 - 0.2 x10E3/uL   Immature Granulocytes 0 Not Estab. %   Immature Grans (Abs) 0.0 0.0 - 0.1 x10E3/uL  TSH  Result Value Ref Range   TSH 4.280 0.450 - 4.500 uIU/mL  Comprehensive Metabolic Panel (CMET)  Result Value Ref Range   Glucose 90 70 - 99 mg/dL   BUN 18 8 - 27 mg/dL   Creatinine, Ser 9.09 0.57 - 1.00 mg/dL   eGFR 68 >40 fO/fpw/8.26   BUN/Creatinine Ratio 20 12 - 28   Sodium 141 134 - 144 mmol/L   Potassium 4.1 3.5 - 5.2 mmol/L   Chloride 104  96 - 106 mmol/L   CO2 26 20 - 29 mmol/L   Calcium  9.6 8.7 - 10.3 mg/dL   Total Protein 6.8 6.0 - 8.5 g/dL   Albumin 4.4 3.8 - 4.8 g/dL    Globulin, Total 2.4 1.5 - 4.5 g/dL   Bilirubin Total 0.3 0.0 - 1.2 mg/dL   Alkaline Phosphatase 89 49 - 135 IU/L   AST 19 0 - 40 IU/L   ALT 14 0 - 32 IU/L    Last CBC Lab Results  Component Value Date   WBC 6.0 02/12/2024   HGB 12.2 02/12/2024   HCT 37.6 02/12/2024   MCV 91 02/12/2024   MCH 29.4 02/12/2024   RDW 13.3 02/12/2024   PLT 283 02/12/2024   Last metabolic panel Lab Results  Component Value Date   GLUCOSE 90 02/12/2024   NA 141 02/12/2024   K 4.1 02/12/2024   CL 104 02/12/2024   CO2 26 02/12/2024   BUN 18 02/12/2024   CREATININE 0.90 02/12/2024   EGFR 68 02/12/2024   CALCIUM  9.6 02/12/2024   PHOS 3.9 08/03/2021   PROT 6.8 02/12/2024   ALBUMIN 4.4 02/12/2024   LABGLOB 2.4 02/12/2024   AGRATIO 2.1 08/04/2022   BILITOT 0.3 02/12/2024   ALKPHOS 89 02/12/2024   AST 19 02/12/2024   ALT 14 02/12/2024   ANIONGAP 10 12/27/2021   Last lipids Lab Results  Component Value Date   CHOL 191 08/13/2023   HDL 47 08/13/2023   LDLCALC 103 (H) 08/13/2023   LDLDIRECT 122 (H) 02/03/2022   TRIG 243 (H) 08/13/2023   CHOLHDL 4.1 08/13/2023   Last hemoglobin A1c Lab Results  Component Value Date   HGBA1C 5.6 08/13/2023      The 10-year ASCVD risk score (Arnett DK, et al., 2019) is: 15.8%    Assessment & Plan:  Dyspnea on exertion Assessment & Plan: This started in the last 6 months, feels decreased exercise tolerance while doing usual chores at time. 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 No LE edema or chest pain. Last echo in 2019 with normal EF and otherwise normal. CBC, TSH, echo ordered   Orders: -     ECHOCARDIOGRAM COMPLETE; Future -     CBC with Differential/Platelet -     TSH -     Comprehensive metabolic panel with GFR  Gastroesophageal reflux disease, unspecified whether esophagitis present Assessment & Plan: Is taking pantoprazole  40 mg daily, feels chewing gum helps with this more.    Obesity (BMI  30.0-34.9) Assessment & Plan: Has not had much success with weight loss. Referral made to RD.   Orders: -     Referral to Nutrition and Diabetes Services  Essential hypertension Assessment & Plan: Well controlled on current medications. Continue amlodipine  5 mg daily. Feeling decreased exercise tolerance, no dizziness, weakness, or LOC. Decrease metoprolol  25 mg daily. CMP today   Other orders -     Metoprolol  Succinate ER; Take 1 tablet (25 mg total) by mouth daily.  Dispense: 30 tablet; Refill: 1     Return in about 4 weeks (around 03/11/2024) for HTN.    Harlene Saddler, MD

## 2024-02-13 ENCOUNTER — Ambulatory Visit: Payer: Self-pay | Admitting: Student

## 2024-02-13 LAB — CBC WITH DIFFERENTIAL/PLATELET
Basophils Absolute: 0 x10E3/uL (ref 0.0–0.2)
Basos: 1 %
EOS (ABSOLUTE): 0.2 x10E3/uL (ref 0.0–0.4)
Eos: 3 %
Hematocrit: 37.6 % (ref 34.0–46.6)
Hemoglobin: 12.2 g/dL (ref 11.1–15.9)
Immature Grans (Abs): 0 x10E3/uL (ref 0.0–0.1)
Immature Granulocytes: 0 %
Lymphocytes Absolute: 1.9 x10E3/uL (ref 0.7–3.1)
Lymphs: 31 %
MCH: 29.4 pg (ref 26.6–33.0)
MCHC: 32.4 g/dL (ref 31.5–35.7)
MCV: 91 fL (ref 79–97)
Monocytes Absolute: 0.3 x10E3/uL (ref 0.1–0.9)
Monocytes: 6 %
Neutrophils Absolute: 3.6 x10E3/uL (ref 1.4–7.0)
Neutrophils: 59 %
Platelets: 283 x10E3/uL (ref 150–450)
RBC: 4.15 x10E6/uL (ref 3.77–5.28)
RDW: 13.3 % (ref 11.7–15.4)
WBC: 6 x10E3/uL (ref 3.4–10.8)

## 2024-02-13 LAB — COMPREHENSIVE METABOLIC PANEL WITH GFR
ALT: 14 IU/L (ref 0–32)
AST: 19 IU/L (ref 0–40)
Albumin: 4.4 g/dL (ref 3.8–4.8)
Alkaline Phosphatase: 89 IU/L (ref 49–135)
BUN/Creatinine Ratio: 20 (ref 12–28)
BUN: 18 mg/dL (ref 8–27)
Bilirubin Total: 0.3 mg/dL (ref 0.0–1.2)
CO2: 26 mmol/L (ref 20–29)
Calcium: 9.6 mg/dL (ref 8.7–10.3)
Chloride: 104 mmol/L (ref 96–106)
Creatinine, Ser: 0.9 mg/dL (ref 0.57–1.00)
Globulin, Total: 2.4 g/dL (ref 1.5–4.5)
Glucose: 90 mg/dL (ref 70–99)
Potassium: 4.1 mmol/L (ref 3.5–5.2)
Sodium: 141 mmol/L (ref 134–144)
Total Protein: 6.8 g/dL (ref 6.0–8.5)
eGFR: 68 mL/min/1.73 (ref >59)

## 2024-02-13 LAB — TSH: TSH: 4.28 u[IU]/mL (ref 0.450–4.500)

## 2024-02-15 DIAGNOSIS — R0609 Other forms of dyspnea: Secondary | ICD-10-CM | POA: Insufficient documentation

## 2024-02-15 NOTE — Assessment & Plan Note (Signed)
 Has not had much success with weight loss. Referral made to RD.

## 2024-02-15 NOTE — Assessment & Plan Note (Addendum)
 This started in the last 6 months, feels decreased exercise tolerance while doing usual chores at time. 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 No LE edema or chest pain. Last echo in 2019 with normal EF and otherwise normal. CBC, TSH, echo ordered

## 2024-02-15 NOTE — Assessment & Plan Note (Addendum)
 Well controlled on current medications. Continue amlodipine  5 mg daily. Feeling decreased exercise tolerance, no dizziness, weakness, or LOC. Decrease metoprolol  25 mg daily. CMP today

## 2024-03-10 ENCOUNTER — Ambulatory Visit (INDEPENDENT_AMBULATORY_CARE_PROVIDER_SITE_OTHER): Admitting: Student

## 2024-03-10 ENCOUNTER — Encounter: Payer: Self-pay | Admitting: Student

## 2024-03-10 VITALS — BP 138/86 | HR 81 | Ht 65.0 in | Wt 204.0 lb

## 2024-03-10 DIAGNOSIS — M8580 Other specified disorders of bone density and structure, unspecified site: Secondary | ICD-10-CM | POA: Insufficient documentation

## 2024-03-10 DIAGNOSIS — G4700 Insomnia, unspecified: Secondary | ICD-10-CM | POA: Insufficient documentation

## 2024-03-10 DIAGNOSIS — I10 Essential (primary) hypertension: Secondary | ICD-10-CM

## 2024-03-10 MED ORDER — LOSARTAN POTASSIUM 25 MG PO TABS: 25.0000 mg | ORAL_TABLET | Freq: Every day | ORAL | 3 refills | Status: DC

## 2024-03-10 NOTE — Assessment & Plan Note (Signed)
 Difficulty falling asleep due to anxious thoughts. Also wakes up at night to urinate, rarely needs to urinate during the day. Discussed sleep hygiene and CBT-I app. Discussed modifying water intake, kegels, and timed voiding.

## 2024-03-10 NOTE — Progress Notes (Signed)
 Established Patient Office Visit  Subjective   Patient ID: Lauren Leblanc, female    DOB: 03-21-1951  Age: 73 y.o. MRN: [id number]  Chief Complaint  Patient presents with   Hypertension    Lauren Leblanc is a 73 y.o. person with medical hx listed below who presents today for follow up of HTN. Please refer to problem based charting for further details and assessment and plan of current problem and chronic medical conditions.  Patient Active Problem List   Diagnosis Date Noted   Osteopenia 03/10/2024   Insomnia 03/10/2024   Dyspnea on exertion 02/15/2024   Annual physical exam 08/13/2023   Abdominal pain 08/13/2023   Statin intolerance 02/06/2022   Spondylolisthesis of lumbar region 06/18/2020   Sacroiliitis 06/18/2020   Status post total knee replacement using cement, left 04/13/2020   Gastroesophageal reflux disease 07/04/2019   Obesity (BMI 30.0-34.9) 03/13/2018   Primary osteoarthritis of left knee 03/13/2018   Hyperlipidemia, mixed 04/26/2017   Essential hypertension 01/06/2016      ROS Refer to HPI    Objective:     Outpatient Encounter Medications as of 03/10/2024  Medication Sig   amLODipine  (NORVASC ) 5 MG tablet Take 1 tablet (5 mg total) by mouth daily.   Cholecalciferol  (VITAMIN D -3 PO) Take 1 tablet by mouth daily.   Docusate Sodium  (COLACE PO) Take 1 tablet by mouth daily.   losartan (COZAAR) 25 MG tablet Take 1 tablet (25 mg total) by mouth daily.   Multiple Vitamins-Minerals (ONE-A-DAY WOMENS 50 PLUS PO) Take 1 tablet by mouth daily.   pantoprazole  (PROTONIX ) 40 MG tablet Take 1 tablet (40 mg total) by mouth daily.   triamcinolone  cream (KENALOG ) 0.1 % Apply 1 Application topically 2 (two) times daily.   [DISCONTINUED] metoprolol  succinate (TOPROL -XL) 25 MG 24 hr tablet Take 1 tablet (25 mg total) by mouth daily.   No facility-administered encounter medications on file as of 03/10/2024.    BP 138/86   Pulse 81   Ht 5' 5 (1.651 m)    Wt 204 lb (92.5 kg)   SpO2 99%   BMI 33.95 kg/m  BP Readings from Last 3 Encounters:  03/10/24 138/86  02/12/24 136/80  08/13/23 138/88    Physical Exam Constitutional:      Comments: Tired appearing  HENT:     Head: Normocephalic and atraumatic.  Cardiovascular:     Rate and Rhythm: Normal rate and regular rhythm.     Heart sounds: No murmur heard. Pulmonary:     Effort: Pulmonary effort is normal.     Breath sounds: No rhonchi or rales.  Abdominal:     General: Abdomen is flat. Bowel sounds are normal. There is no distension.     Palpations: Abdomen is soft.     Tenderness: There is no abdominal tenderness.  Musculoskeletal:        General: Normal range of motion.     Right lower leg: Edema (1+ around ankles) present.     Left lower leg: Edema (1+ around ankles) present.  Skin:    Capillary Refill: Capillary refill takes less than 2 seconds.     Findings: Erythema (RLE, no warmth or drainage) present.  Neurological:     General: No focal deficit present.     Mental Status: She is alert and oriented to person, place, and time.  Psychiatric:        Mood and Affect: Mood normal.        Behavior: Behavior normal.  03/10/2024    1:13 PM 02/12/2024    2:38 PM 08/13/2023    1:38 PM  Depression screen PHQ 2/9  Decreased Interest 0 0 0  Down, Depressed, Hopeless 0 0 0  PHQ - 2 Score 0 0 0  Altered sleeping  0 2  Tired, decreased energy  1 2  Change in appetite  0 0  Feeling bad or failure about yourself   0 0  Trouble concentrating  0 0  Moving slowly or fidgety/restless  0 0  Suicidal thoughts  0 0  PHQ-9 Score  1  4   Difficult doing work/chores  Not difficult at all Not difficult at all     Data saved with a previous flowsheet row definition       03/10/2024    1:13 PM 02/12/2024    2:38 PM 08/13/2023    1:38 PM 02/12/2023    2:41 PM  GAD 7 : Generalized Anxiety Score  Nervous, Anxious, on Edge 0 0 1 0  Control/stop worrying 0 0 0 0  Worry too  much - different things  0 0 0  Trouble relaxing  0 0 0  Restless  0 0 0  Easily annoyed or irritable  0 0 0  Afraid - awful might happen  0 0 0  Total GAD 7 Score  0 1 0  Anxiety Difficulty  Not difficult at all Not difficult at all Not difficult at all    No results found for any visits on 03/10/24.  Last CBC Lab Results  Component Value Date   WBC 6.0 02/12/2024   HGB 12.2 02/12/2024   HCT 37.6 02/12/2024   MCV 91 02/12/2024   MCH 29.4 02/12/2024   RDW 13.3 02/12/2024   PLT 283 02/12/2024   Last metabolic panel Lab Results  Component Value Date   GLUCOSE 90 02/12/2024   NA 141 02/12/2024   K 4.1 02/12/2024   CL 104 02/12/2024   CO2 26 02/12/2024   BUN 18 02/12/2024   CREATININE 0.90 02/12/2024   EGFR 68 02/12/2024   CALCIUM  9.6 02/12/2024   PHOS 3.9 08/03/2021   PROT 6.8 02/12/2024   ALBUMIN 4.4 02/12/2024   LABGLOB 2.4 02/12/2024   AGRATIO 2.1 08/04/2022   BILITOT 0.3 02/12/2024   ALKPHOS 89 02/12/2024   AST 19 02/12/2024   ALT 14 02/12/2024   ANIONGAP 10 12/27/2021   Last lipids Lab Results  Component Value Date   CHOL 191 08/13/2023   HDL 47 08/13/2023   LDLCALC 103 (H) 08/13/2023   LDLDIRECT 122 (H) 02/03/2022   TRIG 243 (H) 08/13/2023   CHOLHDL 4.1 08/13/2023      The 10-year ASCVD risk score (Arnett DK, et al., 2019) is: 16.3%    Assessment & Plan:  Essential hypertension Assessment & Plan: BP today was 138/86, BP at home typically 132-150 over 80-90, generally appears elevated. Does feel a little better with decrease in metoprolol . Start losartan 25 mg daily continue amlodipine , stop metoprolol .    Insomnia, unspecified type Assessment & Plan: Difficulty falling asleep due to anxious thoughts. Also wakes up at night to urinate, rarely needs to urinate during the day. Discussed sleep hygiene and CBT-I app. Discussed modifying water intake, kegels, and timed voiding.    Other orders -     Losartan Potassium; Take 1 tablet (25 mg total)  by mouth daily.  Dispense: 30 tablet; Refill: 3     Return in about 4 weeks (around 04/07/2024) for HTN.  Lauren Saddler, MD

## 2024-03-10 NOTE — Assessment & Plan Note (Addendum)
 BP today was 138/86, BP at home typically 132-150 over 80-90, generally appears elevated. Does feel a little better with decrease in metoprolol . Start losartan 25 mg daily continue amlodipine , stop metoprolol .

## 2024-03-13 ENCOUNTER — Ambulatory Visit
Admission: RE | Admit: 2024-03-13 | Discharge: 2024-03-13 | Disposition: A | Discharge status: Home / Self Care | Source: Ambulatory Visit | Attending: Student | Admitting: Student

## 2024-03-13 DIAGNOSIS — R0600 Dyspnea, unspecified: Secondary | ICD-10-CM | POA: Diagnosis not present

## 2024-03-13 DIAGNOSIS — I340 Nonrheumatic mitral (valve) insufficiency: Secondary | ICD-10-CM | POA: Insufficient documentation

## 2024-03-13 DIAGNOSIS — R0609 Other forms of dyspnea: Secondary | ICD-10-CM | POA: Diagnosis not present

## 2024-03-13 LAB — ECHOCARDIOGRAM COMPLETE
AR max vel: 1.49 cm2
AV Area VTI: 1.54 cm2
AV Area mean vel: 1.44 cm2
AV Mean grad: 4 mmHg
AV Peak grad: 7 mmHg
Ao pk vel: 1.32 m/s
Area-P 1/2: 3.53 cm2
MV VTI: 1.65 cm2
S' Lateral: 3.6 cm
Single Plane A4C EF: 40.4 %

## 2024-03-14 ENCOUNTER — Encounter: Payer: Self-pay | Admitting: Physician Assistant

## 2024-03-14 ENCOUNTER — Ambulatory Visit (INDEPENDENT_AMBULATORY_CARE_PROVIDER_SITE_OTHER): Admitting: Physician Assistant

## 2024-03-14 ENCOUNTER — Ambulatory Visit: Payer: Self-pay

## 2024-03-14 VITALS — BP 120/88 | HR 76 | Temp 98.0°F | Ht 65.0 in | Wt 204.0 lb

## 2024-03-14 DIAGNOSIS — J209 Acute bronchitis, unspecified: Secondary | ICD-10-CM | POA: Diagnosis not present

## 2024-03-14 DIAGNOSIS — R059 Cough, unspecified: Secondary | ICD-10-CM | POA: Diagnosis not present

## 2024-03-14 LAB — POC COVID19/FLU A&B COMBO
Covid Antigen, POC: NEGATIVE
Influenza A Antigen, POC: NEGATIVE
Influenza B Antigen, POC: NEGATIVE

## 2024-03-14 MED ORDER — PROMETHAZINE-DM 6.25-15 MG/5ML PO SYRP: 5.0000 mL | ORAL_SOLUTION | Freq: Four times a day (QID) | ORAL | 0 refills | Status: AC | PRN

## 2024-03-14 NOTE — Progress Notes (Signed)
 Date:  03/14/2024   Name:  Lauren Leblanc   DOB:  November 25, 1950   MRN:  [id number]   Chief Complaint: Cough  Cough This is a new problem. Episode onset: X3 days. The problem has been gradually worsening. The problem occurs constantly. The cough is Productive of sputum (green mucous). Associated symptoms include headaches, postnasal drip, rhinorrhea, a sore throat, shortness of breath and wheezing. She has tried nothing for the symptoms.    Lauren Leblanc is a 73 y.o. female who typically sees my colleague Dr. Lemon presenting acutely today with 3 days URI symptoms as noted above. Grandkids were sick recently. Not taking any OTC medications due to concerns for her blood pressure. No known chronic lung conditions.   Medication list has been reviewed and updated.  Current Meds  Medication Sig   amLODipine  (NORVASC ) 5 MG tablet Take 1 tablet (5 mg total) by mouth daily.   Cholecalciferol  (VITAMIN D -3 PO) Take 1 tablet by mouth daily.   Docusate Sodium  (COLACE PO) Take 1 tablet by mouth daily.   losartan  (COZAAR ) 25 MG tablet Take 1 tablet (25 mg total) by mouth daily.   Multiple Vitamins-Minerals (ONE-A-DAY WOMENS 50 PLUS PO) Take 1 tablet by mouth daily.   pantoprazole  (PROTONIX ) 40 MG tablet Take 1 tablet (40 mg total) by mouth daily.   promethazine -dextromethorphan (PROMETHAZINE -DM) 6.25-15 MG/5ML syrup Take 5 mLs by mouth 4 (four) times daily as needed for up to 9 days for cough.   triamcinolone  cream (KENALOG ) 0.1 % Apply 1 Application topically 2 (two) times daily.     Review of Systems  HENT:  Positive for postnasal drip, rhinorrhea and sore throat.   Respiratory:  Positive for cough, shortness of breath and wheezing.   Neurological:  Positive for headaches.    Patient Active Problem List   Diagnosis Date Noted   Osteopenia 03/10/2024   Insomnia 03/10/2024   Dyspnea on exertion 02/15/2024   Annual physical exam 08/13/2023   Abdominal pain 08/13/2023   Statin intolerance  02/06/2022   Spondylolisthesis of lumbar region 06/18/2020   Sacroiliitis 06/18/2020   Status post total knee replacement using cement, left 04/13/2020   Gastroesophageal reflux disease 07/04/2019   Obesity (BMI 30.0-34.9) 03/13/2018   Primary osteoarthritis of left knee 03/13/2018   Hyperlipidemia, mixed 04/26/2017   Essential hypertension 01/06/2016    Allergies  Allergen Reactions   Lisinopril  Other (See Comments)    Chest pain, dizziness, cough   Codeine  Hives    hair crawling    Hydrochlorothiazide  Anxiety    Jittery feeling     Immunization History  Administered Date(s) Administered   Fluad Quad(high Dose 65+) 02/02/2021, 12/29/2021   INFLUENZA, HIGH DOSE SEASONAL PF 02/01/2018, 01/16/2024   Influenza, Seasonal, Injecte, Preservative Fre 05/10/2012   Influenza,inj,Quad PF,6+ Mos 02/01/2017   Influenza-Unspecified 02/06/2020, 01/15/2023   PFIZER(Purple Top)SARS-COV-2 Vaccination 06/15/2019, 07/09/2019, 01/22/2020, 10/06/2020, 04/04/2021   PNEUMOCOCCAL CONJUGATE-20 02/03/2022   Pfizer(Comirnaty)Fall Seasonal Vaccine 12 years and older 01/15/2023, 01/16/2024   Pneumococcal Conjugate-13 02/01/2018   Pneumococcal Polysaccharide-23 07/21/2020   Rabies, IM 05/29/2022   Td 11/03/2022   Tdap 12/06/2018   Zoster Recombinant(Shingrix) 11/03/2022, 01/04/2023    Past Surgical History:  Procedure Laterality Date   BACK SURGERY  2014   CYST REMOVAL   BREAST BIOPSY Right    neg   BREAST EXCISIONAL BIOPSY     BREAST EXCISIONAL BIOPSY Right    DILATION AND CURETTAGE OF UTERUS     TOTAL KNEE ARTHROPLASTY Left 04/13/2020  Procedure: TOTAL KNEE ARTHROPLASTY;  Surgeon: Edie Norleen PARAS, MD;  Location: [facility] ORS;  Service: Orthopedics;  Laterality: Left;   TUBAL LIGATION      Social History   Tobacco Use   Smoking status: Never    Passive exposure: Past   Smokeless tobacco: Never  Vaping Use   Vaping status: Never Used  Substance Use Topics   Alcohol use: No     Alcohol/week: 0.0 standard drinks of alcohol   Drug use: No    Family History  Problem Relation Age of Onset   Coronary artery disease Mother 64   Hypertension Mother    Stroke Father    Diabetes Father    Hypertension Sister    Allergies Sister    Breast cancer Maternal Aunt        3 mat aunts   Breast cancer Maternal Aunt    Breast cancer Maternal Aunt         03/10/2024    1:13 PM 02/12/2024    2:38 PM 08/13/2023    1:38 PM 02/12/2023    2:41 PM  GAD 7 : Generalized Anxiety Score  Nervous, Anxious, on Edge 0 0 1 0  Control/stop worrying 0 0 0 0  Worry too much - different things  0 0 0  Trouble relaxing  0 0 0  Restless  0 0 0  Easily annoyed or irritable  0 0 0  Afraid - awful might happen  0 0 0  Total GAD 7 Score  0 1 0  Anxiety Difficulty  Not difficult at all Not difficult at all Not difficult at all       03/10/2024    1:13 PM 02/12/2024    2:38 PM 08/13/2023    1:38 PM  Depression screen PHQ 2/9  Decreased Interest 0 0 0  Down, Depressed, Hopeless 0 0 0  PHQ - 2 Score 0 0 0  Altered sleeping  0 2  Tired, decreased energy  1 2  Change in appetite  0 0  Feeling bad or failure about yourself   0 0  Trouble concentrating  0 0  Moving slowly or fidgety/restless  0 0  Suicidal thoughts  0 0  PHQ-9 Score  1  4   Difficult doing work/chores  Not difficult at all Not difficult at all     Data saved with a previous flowsheet row definition    BP Readings from Last 3 Encounters:  03/14/24 120/88  03/10/24 138/86  02/12/24 136/80    Wt Readings from Last 3 Encounters:  03/14/24 204 lb (92.5 kg)  03/10/24 204 lb (92.5 kg)  02/12/24 208 lb (94.3 kg)    BP 120/88   Pulse 76   Temp 98 F (36.7 C)   Ht 5' 5 (1.651 m)   Wt 204 lb (92.5 kg)   SpO2 97%   BMI 33.95 kg/m   Physical Exam Vitals and nursing note reviewed.  Constitutional:      General: She is not in acute distress.    Appearance: Normal appearance.  HENT:     Right Ear: Tympanic  membrane normal.     Left Ear: Tympanic membrane normal.     Ears:     Comments: EAC clear bilaterally with good view of TM which is without effusion or erythema.     Nose: Nose normal.     Comments: Sinuses nontender    Mouth/Throat:     Mouth: Mucous membranes are moist.  Pharynx: Posterior oropharyngeal erythema (mild) present. No oropharyngeal exudate.  Eyes:     Conjunctiva/sclera: Conjunctivae normal.     Pupils: Pupils are equal, round, and reactive to light.  Cardiovascular:     Rate and Rhythm: Normal rate and regular rhythm.     Heart sounds: No murmur heard.    No friction rub. No gallop.  Pulmonary:     Effort: Pulmonary effort is normal.     Breath sounds: Wheezing and rhonchi present. No rales.     Comments: Heavy cough noted Lymphadenopathy:     Cervical: No cervical adenopathy.     Recent Labs     Component Value Date/Time   NA 141 02/12/2024 1522   K 4.1 02/12/2024 1522   CL 104 02/12/2024 1522   CO2 26 02/12/2024 1522   GLUCOSE 90 02/12/2024 1522   GLUCOSE 95 12/27/2021 1554   BUN 18 02/12/2024 1522   CREATININE 0.90 02/12/2024 1522   CALCIUM  9.6 02/12/2024 1522   PROT 6.8 02/12/2024 1522   ALBUMIN 4.4 02/12/2024 1522   AST 19 02/12/2024 1522   ALT 14 02/12/2024 1522   ALKPHOS 89 02/12/2024 1522   BILITOT 0.3 02/12/2024 1522   GFRNONAA >60 12/27/2021 1554   GFRAA 72 07/04/2019 0848    Lab Results  Component Value Date   WBC 6.0 02/12/2024   HGB 12.2 02/12/2024   HCT 37.6 02/12/2024   MCV 91 02/12/2024   PLT 283 02/12/2024   Lab Results  Component Value Date   HGBA1C 5.6 08/13/2023   Lab Results  Component Value Date   CHOL 191 08/13/2023   HDL 47 08/13/2023   LDLCALC 103 (H) 08/13/2023   LDLDIRECT 122 (H) 02/03/2022   TRIG 243 (H) 08/13/2023   CHOLHDL 4.1 08/13/2023   Lab Results  Component Value Date   TSH 4.280 02/12/2024      Assessment and Plan:  1. Acute bronchitis, unspecified organism (Primary) Likely viral  etiology. Discussed self-limited nature of viral illnesses and advised conservative measures including rest, fluids, honey, and OTC cough/cold medications. Contact precautions advised to limit spread. Encouraged mask wearing and good hand hygiene especially before meals. Call if acutely worsening symptoms or if no improvement by Monday. ED precautions advised if severely worsening over the weekend.  - promethazine -dextromethorphan (PROMETHAZINE -DM) 6.25-15 MG/5ML syrup; Take 5 mLs by mouth 4 (four) times daily as needed for up to 9 days for cough.  Dispense: 118 mL; Refill: 0  2. Cough, unspecified type Negative COVID/flu test  - POC Covid19/Flu A&B Antigen     No follow-ups on file.    Rolan Hoyle, PA-C, DMSc, Nutritionist [facility] [phone]

## 2024-03-14 NOTE — Telephone Encounter (Signed)
 Noted  Pt has appt.  KP

## 2024-03-14 NOTE — Telephone Encounter (Signed)
 FYI Only or Action Required?: Action required by provider: request for appointment.  Patient was last seen in primary care on 03/10/2024 by Lemon Raisin, MD.  Called Nurse Triage reporting Cough.  Symptoms began several days ago.  Interventions attempted: Nothing.  Symptoms are: gradually worsening.Productive cough, wheezing, chest tight, SOB with exertion.  Triage Disposition: See HCP Within 4 Hours (Or PCP Triage)  Patient/caregiver understands and will follow disposition?: Yes       Copied from CRM #[id number]. Topic: Clinical - Red Word Triage >> Mar 14, 2024  8:55 AM [name] wrote: [facility] that prompted transfer to Nurse Triage: Patient called in stated her chest hurting, hard for her to breath not sure if she has covid or flu    ----------------------------------------------------------------------- From previous Reason for Contact - Scheduling: Patient/patient representative is calling to schedule an appointment. Refer to attachments for appointment information. Reason for Disposition  [1] MILD difficulty breathing (e.g., minimal/no SOB at rest, SOB with walking, pulse < 100) AND [2] still present when not coughing  Answer Assessment - Initial Assessment Questions 1. ONSET: When did the cough begin?      Tuesday 2. SEVERITY: How bad is the cough today?      severe 3. SPUTUM: Describe the color of your sputum (e.g., none, dry cough; clear, white, yellow, green)     green 4. HEMOPTYSIS: Are you coughing up any blood? If Yes, ask: How much? (e.g., flecks, streaks, tablespoons, etc.)     no 5. DIFFICULTY BREATHING: Are you having difficulty breathing? If Yes, ask: How bad is it? (e.g., mild, moderate, severe)      mild 6. FEVER: Do you have a fever? If Yes, ask: What is your temperature, how was it measured, and when did it start?     no 7. CARDIAC HISTORY: Do you have any history of heart disease? (e.g., heart attack, congestive heart failure)       yes 8. LUNG HISTORY: Do you have any history of lung disease?  (e.g., pulmonary embolus, asthma, emphysema)     no 9. PE RISK FACTORS: Do you have a history of blood clots? (or: recent major surgery, recent prolonged travel, bedridden)     no 10. OTHER SYMPTOMS: Do you have any other symptoms? (e.g., runny nose, wheezing, chest pain)       wheezing 11. PREGNANCY: Is there any chance you are pregnant? When was your last menstrual period?       no 12. TRAVEL: Have you traveled out of the country in the last month? (e.g., travel history, exposures)       no  Protocols used: Cough - Acute Productive-A-AH

## 2024-03-17 ENCOUNTER — Ambulatory Visit: Payer: Self-pay

## 2024-03-17 ENCOUNTER — Other Ambulatory Visit: Payer: Self-pay | Admitting: Student

## 2024-03-17 ENCOUNTER — Other Ambulatory Visit: Payer: Self-pay | Admitting: Physician Assistant

## 2024-03-17 DIAGNOSIS — J209 Acute bronchitis, unspecified: Secondary | ICD-10-CM

## 2024-03-17 MED ORDER — AZITHROMYCIN 250 MG PO TABS: ORAL_TABLET | ORAL | 0 refills | Status: AC

## 2024-03-17 NOTE — Telephone Encounter (Signed)
 FYI Only or Action Required?: Action required by provider: Requesting call back from office clinical staff.  Patient was last seen in primary care on 03/14/2024 by Manya Toribio SQUIBB, PA.  Called Nurse Triage reporting Cough and Wheezing.  Symptoms began a week ago.  Interventions attempted: Prescription medications: promethazine -dextromethorphan (PROMETHAZINE -DM) 6.25-15 MG/5ML syrup.  Symptoms are: unchanged.  Triage Disposition: See HCP Within 4 Hours (Or PCP Triage)  Patient/caregiver understands and will follow disposition?: Yes Reason for Disposition  Wheezing is present  Answer Assessment - Initial Assessment Questions Was told to call in today if not feeling significantly better. States still feels as bad as she did on Friday. Using promethazine -dextromethorphan (PROMETHAZINE -DM) 6.25-15 MG/5ML syrup. Patient just seen 11/21, would like to know if there is anything else that can be sent to pharmacy or if she needs to come in again to be seen. Please advise. Requesting callback [phone]   1. ONSET: When did the cough begin?      Last week  2. SEVERITY: How bad is the cough today?      Moderate  3. SPUTUM: Describe the color of your sputum (e.g., none, dry cough; clear, white, yellow, green)     Green  4. HEMOPTYSIS: Are you coughing up any blood? If Yes, ask: How much? (e.g., flecks, streaks, tablespoons, etc.)     Denies  5. DIFFICULTY BREATHING: Are you having difficulty breathing? If Yes, ask: How bad is it? (e.g., mild, moderate, severe)      Mild, wheezing  6. FEVER: Do you have a fever? If Yes, ask: What is your temperature, how was it measured, and when did it start?     Denies  7. CARDIAC HISTORY: Do you have any history of heart disease? (e.g., heart attack, congestive heart failure)      Denies  8. LUNG HISTORY: Do you have any history of lung disease?  (e.g., pulmonary embolus, asthma, emphysema)     Denies  9. OTHER SYMPTOMS:  Do you have any other symptoms? (e.g., runny nose, wheezing, chest pain)       Wheezing  Protocols used: Cough - Acute Productive-A-AH

## 2024-03-17 NOTE — Progress Notes (Signed)
 Patient not improving. Cover with z-pack.

## 2024-03-17 NOTE — Telephone Encounter (Signed)
 Please review.  Was told to call in today if not feeling significantly better. States still feels as bad as she did on Friday. Using promethazine -dextromethorphan (PROMETHAZINE -DM) 6.25-15 MG/5ML syrup. Patient just seen 11/21, would like to know if there is anything else that can be sent to pharmacy or if she needs to come in again to be seen. Please advise. Requesting callback [phone]   KP

## 2024-03-17 NOTE — Telephone Encounter (Signed)
 Called pt she is aware.  KP

## 2024-04-07 ENCOUNTER — Ambulatory Visit: Admitting: Student

## 2024-04-07 ENCOUNTER — Encounter: Payer: Self-pay | Admitting: Student

## 2024-04-07 VITALS — BP 136/82 | HR 72 | Ht 65.0 in | Wt 203.0 lb

## 2024-04-07 DIAGNOSIS — I10 Essential (primary) hypertension: Secondary | ICD-10-CM | POA: Diagnosis not present

## 2024-04-07 DIAGNOSIS — K219 Gastro-esophageal reflux disease without esophagitis: Secondary | ICD-10-CM

## 2024-04-07 MED ORDER — PANTOPRAZOLE SODIUM 40 MG PO TBEC: 40.0000 mg | DELAYED_RELEASE_TABLET | Freq: Every day | ORAL | 1 refills | Status: AC

## 2024-04-07 MED ORDER — LOSARTAN POTASSIUM 50 MG PO TABS: 50.0000 mg | ORAL_TABLET | Freq: Every day | ORAL | 1 refills | Status: AC

## 2024-04-07 NOTE — Assessment & Plan Note (Signed)
 Well controlled on current medication. Refill ordered today.

## 2024-04-07 NOTE — Assessment & Plan Note (Addendum)
 Has been taking losartan  25 mg daily and amlodipine  5 mg. Home pressures are elevated to 140-160 systolic and 70-90 diastolic. Takes her BP shortly before taking antihypertensives in the evening. BP lower to 140s systolic when she checks in the morning. Discussed checking BP in the morning. Tolerating losartan  without side effects. -Increase losartan  to 50 mg daily, continue amlodipine   -CMP -Urine albumin/cr at next visit, unable to give sample today.  -bring log to next appointment

## 2024-04-07 NOTE — Progress Notes (Signed)
 Established Patient Office Visit  Subjective   Patient ID: Lauren Leblanc, female    DOB: [birth year]  Age: 73 y.o. MRN: [id number]  Chief Complaint  Patient presents with   Hypertension    Lauren Leblanc is a 73 y.o. person with medical hx listed below who presents today for hypertension follow up. 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   Cholecalciferol  (VITAMIN D -3 PO) Take 1 tablet by mouth daily.   Docusate Sodium  (COLACE PO) Take 1 tablet by mouth daily.   Multiple Vitamins-Minerals (ONE-A-DAY WOMENS 50 PLUS PO) Take 1 tablet by mouth daily.   triamcinolone  cream (KENALOG ) 0.1 % Apply 1 Application topically 2 (two) times daily.   [DISCONTINUED] losartan  (COZAAR ) 25 MG tablet Take 1 tablet (25 mg total) by mouth daily.   [DISCONTINUED] metoprolol  succinate (TOPROL -XL) 25 MG 24 hr tablet Take 25 mg by mouth daily.   [DISCONTINUED] pantoprazole  (PROTONIX ) 40 MG tablet Take 1 tablet (40 mg total) by mouth daily.   losartan  (COZAAR ) 50 MG tablet Take 1 tablet (50 mg total) by mouth daily.   pantoprazole  (PROTONIX ) 40 MG tablet Take 1 tablet (40 mg  total) by mouth daily.   No facility-administered encounter medications on file as of 04/07/2024.    BP 136/82   Pulse 72   Ht 5' 5 (1.651 m)   Wt 203 lb (92.1 kg)   SpO2 99%   BMI 33.78 kg/m  BP Readings from Last 3 Encounters:  04/07/24 136/82  03/14/24 120/88  03/10/24 138/86    Physical Exam Constitutional:      Appearance: Normal appearance.  HENT:     Head: Normocephalic and atraumatic.     Mouth/Throat:     Mouth: Mucous membranes are moist.     Pharynx: Oropharynx is clear.  Cardiovascular:     Rate and Rhythm: Normal rate and regular rhythm.     Heart sounds: No murmur heard. Pulmonary:     Effort: Pulmonary effort is normal.     Breath sounds: No rhonchi or rales.  Abdominal:     General: Abdomen is flat. Bowel sounds are normal. There is no distension.     Palpations: Abdomen is soft.     Tenderness: There is no abdominal tenderness.  Musculoskeletal:        General: Normal range of motion.     Right lower leg: No edema.     Left lower leg: No edema.  Skin:    General: Skin is warm and dry.     Capillary Refill: Capillary refill takes less than 2 seconds.  Neurological:     General: No focal deficit present.  Mental Status: She is alert and oriented to person, place, and time.  Psychiatric:        Mood and Affect: Mood normal.        Behavior: Behavior normal.        04/07/2024    1:11 PM 03/10/2024    1:13 PM 02/12/2024    2:38 PM  Depression screen PHQ 2/9  Decreased Interest 0 0 0  Down, Depressed, Hopeless 0 0 0  PHQ - 2 Score 0 0 0  Altered sleeping   0  Tired, decreased energy   1  Change in appetite   0  Feeling bad or failure about yourself    0  Trouble concentrating   0  Moving slowly or fidgety/restless   0  Suicidal thoughts   0  PHQ-9 Score   1   Difficult doing work/chores   Not difficult at all     Data saved with a previous flowsheet row definition       04/07/2024    1:11 PM 03/10/2024    1:13 PM 02/12/2024     2:38 PM 08/13/2023    1:38 PM  GAD 7 : Generalized Anxiety Score  Nervous, Anxious, on Edge 0 0 0 1  Control/stop worrying 0 0 0 0  Worry too much - different things 0  0 0  Trouble relaxing 0  0 0  Restless 0  0 0  Easily annoyed or irritable 0  0 0  Afraid - awful might happen 0  0 0  Total GAD 7 Score 0  0 1  Anxiety Difficulty Not difficult at all  Not difficult at all Not difficult at all    No results found for any visits on 04/07/24.  Last CBC Lab Results  Component Value Date   WBC 6.0 02/12/2024   HGB 12.2 02/12/2024   HCT 37.6 02/12/2024   MCV 91 02/12/2024   MCH 29.4 02/12/2024   RDW 13.3 02/12/2024   PLT 283 02/12/2024   Last metabolic panel Lab Results  Component Value Date   GLUCOSE 90 02/12/2024   NA 141 02/12/2024   K 4.1 02/12/2024   CL 104 02/12/2024   CO2 26 02/12/2024   BUN 18 02/12/2024   CREATININE 0.90 02/12/2024   EGFR 68 02/12/2024   CALCIUM  9.6 02/12/2024   PHOS 3.9 08/03/2021   PROT 6.8 02/12/2024   ALBUMIN 4.4 02/12/2024   LABGLOB 2.4 02/12/2024   AGRATIO 2.1 08/04/2022   BILITOT 0.3 02/12/2024   ALKPHOS 89 02/12/2024   AST 19 02/12/2024   ALT 14 02/12/2024   ANIONGAP 10 12/27/2021   Last lipids Lab Results  Component Value Date   CHOL 191 08/13/2023   HDL 47 08/13/2023   LDLCALC 103 (H) 08/13/2023   LDLDIRECT 122 (H) 02/03/2022   TRIG 243 (H) 08/13/2023   CHOLHDL 4.1 08/13/2023   Last hemoglobin A1c Lab Results  Component Value Date   HGBA1C 5.6 08/13/2023   Last thyroid  functions Lab Results  Component Value Date   TSH 4.280 02/12/2024   FREET4 0.88 12/11/2018      The 10-year ASCVD risk score (Arnett DK, et al., 2019) is: 15.8%    Assessment & Plan:  Essential hypertension Assessment & Plan: Has been taking losartan  25 mg daily and amlodipine  5 mg. Home pressures are elevated to 140-160 systolic and 70-90 diastolic. Takes her BP shortly before taking antihypertensives in the evening. BP lower to 140s  systolic when she checks in  the morning. Discussed checking BP in the morning. Tolerating losartan  without side effects. -Increase losartan  to 50 mg daily, continue amlodipine   -CMP -Urine albumin/cr at next visit, unable to give sample today.  -bring log to next appointment  Orders: -     Comprehensive metabolic panel with GFR  Gastroesophageal reflux disease, unspecified whether esophagitis present Assessment & Plan: Well controlled on current medication. Refill ordered today.   Orders: -     Pantoprazole  Sodium; Take 1 tablet (40 mg total) by mouth daily.  Dispense: 90 tablet; Refill: 1  Other orders -     Losartan  Potassium; Take 1 tablet (50 mg total) by mouth daily.  Dispense: 90 tablet; Refill: 1     Return for HTN.    Harlene Saddler, MD

## 2024-04-08 ENCOUNTER — Ambulatory Visit: Payer: Self-pay | Admitting: Student

## 2024-04-08 LAB — COMPREHENSIVE METABOLIC PANEL WITH GFR
ALT: 15 IU/L (ref 0–32)
AST: 25 IU/L (ref 0–40)
Albumin: 4.7 g/dL (ref 3.8–4.8)
Alkaline Phosphatase: 89 IU/L (ref 49–135)
BUN/Creatinine Ratio: 21 (ref 12–28)
BUN: 19 mg/dL (ref 8–27)
Bilirubin Total: 0.3 mg/dL (ref 0.0–1.2)
CO2: 23 mmol/L (ref 20–29)
Calcium: 9.4 mg/dL (ref 8.7–10.3)
Chloride: 104 mmol/L (ref 96–106)
Creatinine, Ser: 0.9 mg/dL (ref 0.57–1.00)
Globulin, Total: 2.2 g/dL (ref 1.5–4.5)
Glucose: 82 mg/dL (ref 70–99)
Potassium: 4.4 mmol/L (ref 3.5–5.2)
Sodium: 142 mmol/L (ref 134–144)
Total Protein: 6.9 g/dL (ref 6.0–8.5)
eGFR: 68 mL/min/1.73 (ref >59)

## 2024-04-10 ENCOUNTER — Ambulatory Visit: Payer: Self-pay

## 2024-04-10 ENCOUNTER — Other Ambulatory Visit: Payer: Self-pay

## 2024-04-10 ENCOUNTER — Emergency Department
Admission: EM | Admit: 2024-04-10 | Discharge: 2024-04-10 | Disposition: A | Discharge status: Home / Self Care | Source: Ambulatory Visit | Attending: Emergency Medicine | Admitting: Emergency Medicine

## 2024-04-10 DIAGNOSIS — I10 Essential (primary) hypertension: Secondary | ICD-10-CM | POA: Insufficient documentation

## 2024-04-10 DIAGNOSIS — R42 Dizziness and giddiness: Secondary | ICD-10-CM | POA: Diagnosis not present

## 2024-04-10 LAB — COMPREHENSIVE METABOLIC PANEL WITH GFR
ALT: 16 U/L (ref 0–44)
AST: 22 U/L (ref 15–41)
Albumin: 4.5 g/dL (ref 3.5–5.0)
Alkaline Phosphatase: 82 U/L (ref 38–126)
Anion gap: 10 (ref 5–15)
BUN: 20 mg/dL (ref 8–23)
CO2: 27 mmol/L (ref 22–32)
Calcium: 9.7 mg/dL (ref 8.9–10.3)
Chloride: 105 mmol/L (ref 98–111)
Creatinine, Ser: 0.88 mg/dL (ref 0.44–1.00)
GFR, Estimated: >60 mL/min (ref >60)
Glucose, Bld: 105 mg/dL — ABNORMAL HIGH (ref 70–99)
Potassium: 4.3 mmol/L (ref 3.5–5.1)
Sodium: 142 mmol/L (ref 135–145)
Total Bilirubin: 0.3 mg/dL (ref 0.0–1.2)
Total Protein: 7.1 g/dL (ref 6.5–8.1)

## 2024-04-10 LAB — URINALYSIS, ROUTINE W REFLEX MICROSCOPIC
Bilirubin Urine: NEGATIVE
Glucose, UA: NEGATIVE mg/dL
Hgb urine dipstick: NEGATIVE
Ketones, ur: NEGATIVE mg/dL
Leukocytes,Ua: NEGATIVE
Nitrite: NEGATIVE
Protein, ur: NEGATIVE mg/dL
Specific Gravity, Urine: 1.016 (ref 1.005–1.030)
pH: 6 (ref 5.0–8.0)

## 2024-04-10 LAB — CBC
HCT: 34.8 % — ABNORMAL LOW (ref 36.0–46.0)
Hemoglobin: 11.8 g/dL — ABNORMAL LOW (ref 12.0–15.0)
MCH: 29.4 pg (ref 26.0–34.0)
MCHC: 33.9 g/dL (ref 30.0–36.0)
MCV: 86.6 fL (ref 80.0–100.0)
Platelets: 280 K/uL (ref 150–400)
RBC: 4.02 MIL/uL (ref 3.87–5.11)
RDW: 13.2 % (ref 11.5–15.5)
WBC: 6.6 K/uL (ref 4.0–10.5)
nRBC: 0 % (ref 0.0–0.2)

## 2024-04-10 NOTE — ED Notes (Signed)
 Pt ambulatory to restroom to provide urine sample.

## 2024-04-10 NOTE — ED Provider Notes (Signed)
 [facility] Provider Note    Event Date/Time   First MD Initiated Contact with Patient 04/10/24 1502     (approximate)  History   Chief Complaint: Dizziness  HPI  Lauren Leblanc is a 73 y.o. female with a past medical history of arthritis, gastric reflux, hypertension, presents to the emergency department for feeling lightheaded earlier today.  According to the patient she had just sat down at her computer to do some work when she began feeling somewhat lightheaded.  Patient states the symptoms seem to pass however when she stood up she once again felt lightheaded, which concerned her so she came to the emergency department.  Patient denies any weakness or numbness of any arm or leg denies any confusion or slurred speech.  No chest pain.  Did not become sweaty or short of breath.  Patient states she has been monitoring her blood pressure closely, recently was seen by her PCP earlier this week stopped her metoprolol  and doubled her losartan .  Patient has only been doing this for a couple days.  Currently patient states she feels well feels back to normal and is ready to go home.  Physical Exam   Triage Vital Signs: ED Triage Vitals  Encounter Vitals Group     BP 04/10/24 1233 (!) 143/71     Girls Systolic BP Percentile --      Girls Diastolic BP Percentile --      Boys Systolic BP Percentile --      Boys Diastolic BP Percentile --      Pulse Rate 04/10/24 1233 70     Resp 04/10/24 1233 18     Temp 04/10/24 1233 97.8 F (36.6 C)     Temp src --      SpO2 04/10/24 1233 100 %     Weight 04/10/24 1234 203 lb (92.1 kg)     Height 04/10/24 1234 5' 5 (1.651 m)     Head Circumference --      Peak Flow --      Pain Score 04/10/24 1233 0     Pain Loc --      Pain Education --      Exclude from Growth Chart --     Most recent vital signs: Vitals:   04/10/24 1233 04/10/24 1443  BP: (!) 143/71 (!) 157/78  Pulse: 70 72  Resp: 18 18  Temp: 97.8 F (36.6  C)   SpO2: 100% 99%    General: Awake, no distress.  CV:  Good peripheral perfusion.  Regular rate and rhythm  Resp:  Normal effort.  Equal breath sounds bilaterally.  Abd:  No distention.  Soft, nontender.  No rebound or guarding.  ED Results / Procedures / Treatments   EKG  EKG viewed and interpreted by myself shows a normal sinus rhythm at 67 bpm with a narrow QRS, normal axis, normal intervals, no concerning ST changes.  MEDICATIONS ORDERED IN ED: Medications - No data to display   IMPRESSION / MDM / ASSESSMENT AND PLAN / ED COURSE  I reviewed the triage vital signs and the nursing notes.  Patient's presentation is most consistent with acute presentation with potential threat to life or bodily function.  Patient presents to the emergency department for dizziness/lightheadedness sensation.  Patient states that symptoms started earlier today when she was at her desk and then resolved.  She is stood up and the symptoms occurred once again.  Patient's workup today in the emergency department is  reassuring with a normal CBC normal chemistry normal urinalysis.  Patient's EKG is reassuring as well vital signs show mild hypertension but otherwise reassuring.  Patient states she is feeling well denies any symptoms currently.  Symptoms could possibly be related to her blood pressure medication changes that occurred earlier this week.  Patient was taken off of her metoprolol  and her losartan  was doubled from 25 mg to 50 mg.  Patient states she is very sensitive to medication changes and has had side effects to medication changes in the past.  Patient denies any chest pain she has a reassuring normal neuroexam as well as physical exam.  Given the patient's reassuring workup reassuring exam I believe the patient is safe for discharge home with outpatient follow-up.  I discussed with the patient decreasing her losartan  back to 25 mg until she can see her doctor.  Patient agreeable to plan of  care.  FINAL CLINICAL IMPRESSION(S) / ED DIAGNOSES   Lightheadedness  Note:  This document was prepared using Dragon voice recognition software and may include unintentional dictation errors.   Dorothyann Drivers, MD 04/10/24 [phone]

## 2024-04-10 NOTE — Telephone Encounter (Signed)
Noted  Pt advised to go to ED.  KP 

## 2024-04-10 NOTE — Telephone Encounter (Signed)
 FYI Only or Action Required?: FYI only for provider: ED advised.  Patient was last seen in primary care on 04/07/2024 by Lemon Raisin, MD.  Called Nurse Triage reporting Dizziness.  Symptoms began today.  Interventions attempted: Rest, hydration, or home remedies.  Symptoms are: gradually improving.  Triage Disposition: Go to ED Now (or PCP Triage)  Patient/caregiver understands and will follow disposition?: Yes   Copied from CRM #[id number]. Topic: Clinical - Red Word Triage >> Apr 10, 2024 11:44 AM Lauren Leblanc wrote: Red Word that prompted transfer to Nurse Triage: Patient almost passed out this morning, dizziness, fatigue. Reason for Disposition  SEVERE dizziness (e.g., unable to stand, requires support to walk, feels like passing out now)  Answer Assessment - Initial Assessment Questions 15 minutes ago went to stand up when she felt like she would pass out she sat down immediately for several minutes, when she went to get up a second time she felt like she would pass out again. Now feeling shaky all over. She has never experienced this in the past. She is unable to check her vital signs at this time due to being at work. Denies tingling, numbness, weakness, vision changes, chest pain, and difficulty breathing. She is unsure if she is having heart palpation. Monday she started her increased dose of Losartan  50mg .   ED advised, another adult will drive, discussed reasons to abort personal transport for 911.     1. DESCRIPTION: Describe your dizziness.     Lightheaded  2. LIGHTHEADED: Do you feel lightheaded? (e.g., somewhat faint, woozy, weak upon standing)     Pre syncope  Protocols used: Dizziness - Lightheadedness-A-AH

## 2024-04-10 NOTE — ED Triage Notes (Signed)
 Pt to ED for dizziness started today when standing. Reports recently doubled dosage of losartan .

## 2024-04-10 NOTE — Discharge Instructions (Signed)
 As we discussed please decrease your losartan  back to 25 mg daily until you can be seen or speak to your primary care doctor.  Please drink plenty of fluids.  Return to the emergency department for any further episodes of dizziness, lightheadedness, any weakness or numbness confusion or slurred speech or any other symptom personally concerning to yourself.

## 2024-04-13 ENCOUNTER — Other Ambulatory Visit: Payer: Self-pay | Admitting: Student

## 2024-04-16 NOTE — Telephone Encounter (Signed)
 No longer listed on current medication list Requested Prescriptions  Pending Prescriptions Disp Refills   metoprolol  succinate (TOPROL -XL) 25 MG 24 hr tablet [Pharmacy Med Name: Metoprolol  Succinate ER 25 MG Oral Tablet Extended Release 24 Hour] 30 tablet 0    Sig: Take 1 tablet by mouth once daily     Cardiovascular:  Beta Blockers Failed - 04/16/2024 10:22 AM      Failed - Last BP in normal range    BP Readings from Last 1 Encounters:  04/10/24 (!) 157/78         Passed - Last Heart Rate in normal range    Pulse Readings from Last 1 Encounters:  04/10/24 72         Passed - Valid encounter within last 6 months    Recent Outpatient Visits           1 week ago Essential hypertension   [place] [facility] at [facility], MD   1 month ago Acute bronchitis, unspecified organism   [facility] at [facility], Toribio SQUIBB, PA   1 month ago Essential hypertension   [place] [facility] at [facility], MD   2 months ago Dyspnea on exertion   [facility] at [facility], MD   8 months ago Hyperlipidemia, mixed    Primary Care & Sports Medicine at [facility], MD

## 2024-05-01 ENCOUNTER — Observation Stay: Admitting: Anesthesiology

## 2024-05-01 ENCOUNTER — Observation Stay
Admission: EM | Admit: 2024-05-01 | Discharge: 2024-05-02 | Disposition: A | Discharge status: Home / Self Care | Attending: Emergency Medicine | Admitting: Emergency Medicine

## 2024-05-01 ENCOUNTER — Emergency Department

## 2024-05-01 ENCOUNTER — Other Ambulatory Visit: Payer: Self-pay

## 2024-05-01 ENCOUNTER — Encounter
Admission: EM | Disposition: A | Discharge status: Home / Self Care | Payer: Self-pay | Source: Home / Self Care | Attending: Emergency Medicine

## 2024-05-01 DIAGNOSIS — I10 Essential (primary) hypertension: Secondary | ICD-10-CM | POA: Diagnosis not present

## 2024-05-01 DIAGNOSIS — K219 Gastro-esophageal reflux disease without esophagitis: Secondary | ICD-10-CM | POA: Insufficient documentation

## 2024-05-01 DIAGNOSIS — K812 Acute cholecystitis with chronic cholecystitis: Principal | ICD-10-CM | POA: Insufficient documentation

## 2024-05-01 DIAGNOSIS — Z79899 Other long term (current) drug therapy: Secondary | ICD-10-CM | POA: Diagnosis not present

## 2024-05-01 DIAGNOSIS — K810 Acute cholecystitis: Principal | ICD-10-CM

## 2024-05-01 DIAGNOSIS — R1013 Epigastric pain: Secondary | ICD-10-CM | POA: Diagnosis present

## 2024-05-01 DIAGNOSIS — R079 Chest pain, unspecified: Secondary | ICD-10-CM

## 2024-05-01 DIAGNOSIS — E041 Nontoxic single thyroid nodule: Secondary | ICD-10-CM

## 2024-05-01 DIAGNOSIS — R911 Solitary pulmonary nodule: Secondary | ICD-10-CM

## 2024-05-01 DIAGNOSIS — R1110 Vomiting, unspecified: Secondary | ICD-10-CM

## 2024-05-01 DIAGNOSIS — K209 Esophagitis, unspecified without bleeding: Secondary | ICD-10-CM

## 2024-05-01 LAB — MAGNESIUM: Magnesium: 2.1 mg/dL (ref 1.7–2.4)

## 2024-05-01 LAB — URINALYSIS, ROUTINE W REFLEX MICROSCOPIC
Bilirubin Urine: NEGATIVE
Glucose, UA: NEGATIVE mg/dL
Hgb urine dipstick: NEGATIVE
Ketones, ur: NEGATIVE mg/dL
Leukocytes,Ua: NEGATIVE
Nitrite: NEGATIVE
Protein, ur: NEGATIVE mg/dL
Specific Gravity, Urine: 1.027 (ref 1.005–1.030)
pH: 5 (ref 5.0–8.0)

## 2024-05-01 LAB — COMPREHENSIVE METABOLIC PANEL WITH GFR
ALT: 15 U/L (ref 0–44)
AST: 22 U/L (ref 15–41)
Albumin: 4.4 g/dL (ref 3.5–5.0)
Alkaline Phosphatase: 89 U/L (ref 38–126)
Anion gap: 10 (ref 5–15)
BUN: 24 mg/dL — ABNORMAL HIGH (ref 8–23)
CO2: 27 mmol/L (ref 22–32)
Calcium: 10.2 mg/dL (ref 8.9–10.3)
Chloride: 104 mmol/L (ref 98–111)
Creatinine, Ser: 0.89 mg/dL (ref 0.44–1.00)
GFR, Estimated: >60 mL/min
Glucose, Bld: 114 mg/dL — ABNORMAL HIGH (ref 70–99)
Potassium: 3.9 mmol/L (ref 3.5–5.1)
Sodium: 141 mmol/L (ref 135–145)
Total Bilirubin: 0.3 mg/dL (ref 0.0–1.2)
Total Protein: 6.9 g/dL (ref 6.5–8.1)

## 2024-05-01 LAB — CBC WITH DIFFERENTIAL/PLATELET
Abs Immature Granulocytes: 0.04 K/uL (ref 0.00–0.07)
Basophils Absolute: 0 K/uL (ref 0.0–0.1)
Basophils Relative: 0 %
Eosinophils Absolute: 0.1 K/uL (ref 0.0–0.5)
Eosinophils Relative: 1 %
HCT: 35 % — ABNORMAL LOW (ref 36.0–46.0)
Hemoglobin: 11.7 g/dL — ABNORMAL LOW (ref 12.0–15.0)
Immature Granulocytes: 0 %
Lymphocytes Relative: 19 %
Lymphs Abs: 2.1 K/uL (ref 0.7–4.0)
MCH: 29.6 pg (ref 26.0–34.0)
MCHC: 33.4 g/dL (ref 30.0–36.0)
MCV: 88.6 fL (ref 80.0–100.0)
Monocytes Absolute: 0.4 K/uL (ref 0.1–1.0)
Monocytes Relative: 4 %
Neutro Abs: 8.5 K/uL — ABNORMAL HIGH (ref 1.7–7.7)
Neutrophils Relative %: 76 %
Platelets: 259 K/uL (ref 150–400)
RBC: 3.95 MIL/uL (ref 3.87–5.11)
RDW: 13.2 % (ref 11.5–15.5)
WBC: 11.2 K/uL — ABNORMAL HIGH (ref 4.0–10.5)
nRBC: 0 % (ref 0.0–0.2)

## 2024-05-01 LAB — LIPASE, BLOOD: Lipase: 25 U/L (ref 11–51)

## 2024-05-01 LAB — TROPONIN T, HIGH SENSITIVITY
Troponin T High Sensitivity: 17 ng/L (ref 0–19)
Troponin T High Sensitivity: 20 ng/L — ABNORMAL HIGH (ref 0–19)

## 2024-05-01 MED ORDER — SODIUM CHLORIDE 0.9 % IV SOLN: INTRAVENOUS | Status: DC

## 2024-05-01 MED ORDER — BUPIVACAINE-EPINEPHRINE (PF) 0.5% -1:200000 IJ SOLN
INTRAMUSCULAR | Status: DC | PRN
  Administered 2024-05-01: 30 mL

## 2024-05-01 MED ORDER — FENTANYL CITRATE (PF) 100 MCG/2ML IJ SOLN
INTRAMUSCULAR | Status: DC | PRN
  Administered 2024-05-01 (×4): 50 ug via INTRAVENOUS

## 2024-05-01 MED ORDER — LIDOCAINE VISCOUS HCL 2 % MT SOLN
15.0000 mL | Freq: Once | OROMUCOSAL | Status: AC
  Administered 2024-05-01: 15 mL via OROMUCOSAL
  Filled 2024-05-01: qty 15

## 2024-05-01 MED ORDER — PROPOFOL 10 MG/ML IV BOLUS
INTRAVENOUS | Status: DC | PRN
  Administered 2024-05-01: 60 mg via INTRAVENOUS
  Administered 2024-05-01: 140 mg via INTRAVENOUS

## 2024-05-01 MED ORDER — ACETAMINOPHEN 650 MG RE SUPP: 650.0000 mg | Freq: Four times a day (QID) | RECTAL | Status: DC | PRN

## 2024-05-01 MED ORDER — ESMOLOL HCL 100 MG/10ML IV SOLN
INTRAVENOUS | Status: DC | PRN
  Administered 2024-05-01 (×4): 20 mg via INTRAVENOUS

## 2024-05-01 MED ORDER — TRAZODONE HCL 50 MG PO TABS: 25.0000 mg | ORAL_TABLET | Freq: Every evening | ORAL | Status: DC | PRN

## 2024-05-01 MED ORDER — POLYETHYLENE GLYCOL 3350 17 G PO PACK: 17.0000 g | PACK | Freq: Every day | ORAL | Status: DC | PRN

## 2024-05-01 MED ORDER — ONDANSETRON HCL 4 MG/2ML IJ SOLN: 4.0000 mg | Freq: Four times a day (QID) | INTRAMUSCULAR | Status: DC | PRN

## 2024-05-01 MED ORDER — LACTATED RINGERS IV SOLN: INTRAVENOUS | Status: DC | PRN

## 2024-05-01 MED ORDER — DOCUSATE SODIUM 50 MG PO CAPS
50.0000 mg | ORAL_CAPSULE | Freq: Every day | ORAL | Status: DC
  Filled 2024-05-01 (×2): qty 1

## 2024-05-01 MED ORDER — ROCURONIUM BROMIDE 100 MG/10ML IV SOLN
INTRAVENOUS | Status: DC | PRN
  Administered 2024-05-01: 20 mg via INTRAVENOUS
  Administered 2024-05-01: 50 mg via INTRAVENOUS

## 2024-05-01 MED ORDER — FENTANYL CITRATE (PF) 100 MCG/2ML IJ SOLN
INTRAMUSCULAR | Status: AC
  Filled 2024-05-01: qty 2

## 2024-05-01 MED ORDER — PANTOPRAZOLE SODIUM 40 MG IV SOLR
40.0000 mg | Freq: Once | INTRAVENOUS | Status: AC
  Administered 2024-05-01: 40 mg via INTRAVENOUS
  Filled 2024-05-01: qty 10

## 2024-05-01 MED ORDER — DEXAMETHASONE SOD PHOSPHATE PF 10 MG/ML IJ SOLN
INTRAMUSCULAR | Status: DC | PRN
  Administered 2024-05-01: 10 mg via INTRAVENOUS

## 2024-05-01 MED ORDER — ALUM & MAG HYDROXIDE-SIMETH 200-200-20 MG/5ML PO SUSP
30.0000 mL | Freq: Once | ORAL | Status: AC
  Administered 2024-05-01: 30 mL via ORAL
  Filled 2024-05-01: qty 30

## 2024-05-01 MED ORDER — SODIUM CHLORIDE 0.9 % IV BOLUS (SEPSIS)
1000.0000 mL | Freq: Once | INTRAVENOUS | Status: AC
  Administered 2024-05-01: 1000 mL via INTRAVENOUS

## 2024-05-01 MED ORDER — KETAMINE HCL 10 MG/ML IJ SOLN
INTRAMUSCULAR | Status: DC | PRN
  Administered 2024-05-01: 20 mg via INTRAVENOUS

## 2024-05-01 MED ORDER — MORPHINE SULFATE (PF) 2 MG/ML IV SOLN
2.0000 mg | Freq: Once | INTRAVENOUS | Status: DC
  Filled 2024-05-01: qty 1

## 2024-05-01 MED ORDER — PROPOFOL 10 MG/ML IV BOLUS
INTRAVENOUS | Status: AC
  Filled 2024-05-01: qty 20

## 2024-05-01 MED ORDER — ENOXAPARIN SODIUM 40 MG/0.4ML IJ SOSY: 40.0000 mg | PREFILLED_SYRINGE | INTRAMUSCULAR | Status: DC

## 2024-05-01 MED ORDER — HYDROMORPHONE HCL 1 MG/ML IJ SOLN
0.5000 mg | INTRAMUSCULAR | Status: DC | PRN
  Administered 2024-05-01 (×2): 1 mg via INTRAVENOUS
  Filled 2024-05-01 (×2): qty 1

## 2024-05-01 MED ORDER — OXYCODONE HCL 5 MG/5ML PO SOLN: 5.0000 mg | Freq: Once | ORAL | Status: AC | PRN

## 2024-05-01 MED ORDER — ONDANSETRON HCL 4 MG/2ML IJ SOLN
INTRAMUSCULAR | Status: DC | PRN
  Administered 2024-05-01: 4 mg via INTRAVENOUS

## 2024-05-01 MED ORDER — INDOCYANINE GREEN 25 MG IJ SOLR
INTRAMUSCULAR | Status: AC
  Filled 2024-05-01: qty 10

## 2024-05-01 MED ORDER — HYDRALAZINE HCL 20 MG/ML IJ SOLN: 5.0000 mg | Freq: Four times a day (QID) | INTRAMUSCULAR | Status: DC | PRN

## 2024-05-01 MED ORDER — LOSARTAN POTASSIUM 25 MG PO TABS
25.0000 mg | ORAL_TABLET | Freq: Every day | ORAL | Status: DC
  Administered 2024-05-01 – 2024-05-02 (×2): 25 mg via ORAL
  Filled 2024-05-01 (×2): qty 1

## 2024-05-01 MED ORDER — ONDANSETRON HCL 4 MG PO TABS: 4.0000 mg | ORAL_TABLET | Freq: Four times a day (QID) | ORAL | Status: DC | PRN

## 2024-05-01 MED ORDER — SUGAMMADEX SODIUM 200 MG/2ML IV SOLN
INTRAVENOUS | Status: DC | PRN
  Administered 2024-05-01: 200 mg via INTRAVENOUS

## 2024-05-01 MED ORDER — OXYCODONE HCL 5 MG PO TABS
ORAL_TABLET | ORAL | Status: AC
  Filled 2024-05-01: qty 1

## 2024-05-01 MED ORDER — LOSARTAN POTASSIUM 50 MG PO TABS: 50.0000 mg | ORAL_TABLET | Freq: Every day | ORAL | Status: DC

## 2024-05-01 MED ORDER — PANTOPRAZOLE SODIUM 40 MG PO TBEC
40.0000 mg | DELAYED_RELEASE_TABLET | Freq: Two times a day (BID) | ORAL | Status: DC
  Administered 2024-05-02: 40 mg via ORAL
  Filled 2024-05-01: qty 1

## 2024-05-01 MED ORDER — SODIUM CHLORIDE 0.9 % IV SOLN
3.0000 g | Freq: Four times a day (QID) | INTRAVENOUS | Status: DC
  Administered 2024-05-01: 3 g via INTRAVENOUS
  Filled 2024-05-01 (×3): qty 8

## 2024-05-01 MED ORDER — KETAMINE HCL 50 MG/5ML IJ SOSY
PREFILLED_SYRINGE | INTRAMUSCULAR | Status: AC
  Filled 2024-05-01: qty 5

## 2024-05-01 MED ORDER — INDOCYANINE GREEN 25 MG IJ SOLR
1.2500 mg | Freq: Once | INTRAMUSCULAR | Status: AC
  Administered 2024-05-01: 1.25 mg via INTRAVENOUS

## 2024-05-01 MED ORDER — ONDANSETRON HCL 4 MG/2ML IJ SOLN
4.0000 mg | Freq: Once | INTRAMUSCULAR | Status: AC
  Administered 2024-05-01: 4 mg via INTRAVENOUS
  Filled 2024-05-01: qty 2

## 2024-05-01 MED ORDER — BUPIVACAINE-EPINEPHRINE (PF) 0.5% -1:200000 IJ SOLN
INTRAMUSCULAR | Status: AC
  Filled 2024-05-01: qty 30

## 2024-05-01 MED ORDER — ACETAMINOPHEN 10 MG/ML IV SOLN
1000.0000 mg | Freq: Once | INTRAVENOUS | Status: AC
  Administered 2024-05-01: 1000 mg via INTRAVENOUS
  Filled 2024-05-01 (×2): qty 100

## 2024-05-01 MED ORDER — GLYCOPYRROLATE 0.2 MG/ML IJ SOLN
INTRAMUSCULAR | Status: DC | PRN
  Administered 2024-05-01 (×2): .1 mg via INTRAVENOUS

## 2024-05-01 MED ORDER — OXYCODONE HCL 5 MG PO TABS
5.0000 mg | ORAL_TABLET | Freq: Once | ORAL | Status: AC | PRN
  Administered 2024-05-01: 5 mg via ORAL

## 2024-05-01 MED ORDER — ACETAMINOPHEN 325 MG PO TABS: 650.0000 mg | ORAL_TABLET | Freq: Four times a day (QID) | ORAL | Status: DC | PRN

## 2024-05-01 MED ORDER — PIPERACILLIN-TAZOBACTAM 3.375 G IVPB 30 MIN
3.3750 g | Freq: Once | INTRAVENOUS | Status: AC
  Administered 2024-05-01: 3.375 g via INTRAVENOUS
  Filled 2024-05-01: qty 50

## 2024-05-01 MED ORDER — FENTANYL CITRATE (PF) 100 MCG/2ML IJ SOLN
25.0000 ug | INTRAMUSCULAR | Status: DC | PRN
  Administered 2024-05-01 (×2): 25 ug via INTRAVENOUS
  Administered 2024-05-01 (×2): 50 ug via INTRAVENOUS

## 2024-05-01 MED ORDER — AMLODIPINE BESYLATE 5 MG PO TABS
5.0000 mg | ORAL_TABLET | Freq: Every day | ORAL | Status: DC
  Administered 2024-05-01 – 2024-05-02 (×2): 5 mg via ORAL
  Filled 2024-05-01 (×2): qty 1

## 2024-05-01 MED ORDER — LIDOCAINE HCL (CARDIAC) PF 100 MG/5ML IV SOSY
PREFILLED_SYRINGE | INTRAVENOUS | Status: DC | PRN
  Administered 2024-05-01: 80 mg via INTRAVENOUS

## 2024-05-01 MED ORDER — IOHEXOL 350 MG/ML SOLN
100.0000 mL | Freq: Once | INTRAVENOUS | Status: AC | PRN
  Administered 2024-05-01: 100 mL via INTRAVENOUS

## 2024-05-01 NOTE — ED Notes (Signed)
 Pt admitted to CCMD monitoring.

## 2024-05-01 NOTE — ED Provider Notes (Signed)
 "  [facility] Provider Note    Event Date/Time   First MD Initiated Contact with Patient 05/01/24 0300     (approximate)   History   Chest Pain   HPI  Lauren Leblanc is a 74 y.o. female with history of hypertension who presents to the emergency department with sudden onset severe central to left-sided chest pain and epigastric abdominal pain that radiates into the mid back that started about an hour after eating an orange tonight.  States pain is slowly improving but now radiating into the lower back and feels cramping in both of her legs and now pain in her abdomen.  She states she checked her blood pressure at home and it was in the 200s systolic.  She states she felt very short of breath and had nausea and vomiting.  No calf tenderness or calf swelling.  No history of PE or DVT.  Has never had similar symptoms.   History provided by patient, EMS.    Past Medical History:  Diagnosis Date   Angina at rest    Arthritis    GERD (gastroesophageal reflux disease)    Hypertension     Past Surgical History:  Procedure Laterality Date   BACK SURGERY  2014   CYST REMOVAL   BREAST BIOPSY Right    neg   BREAST EXCISIONAL BIOPSY     BREAST EXCISIONAL BIOPSY Right    DILATION AND CURETTAGE OF UTERUS     TOTAL KNEE ARTHROPLASTY Left 04/13/2020   Procedure: TOTAL KNEE ARTHROPLASTY;  Surgeon: Edie Norleen PARAS, MD;  Location: [facility] ORS;  Service: Orthopedics;  Laterality: Left;   TUBAL LIGATION      MEDICATIONS:  Prior to Admission medications  Medication Sig Start Date End Date Taking? Authorizing Provider  amLODipine  (NORVASC ) 5 MG tablet Take 1 tablet (5 mg total) by mouth daily. 12/20/23   Lemon Raisin, MD  Cholecalciferol  (VITAMIN D -3 PO) Take 1 tablet by mouth daily.    [provider]  Docusate Sodium  (COLACE PO) Take 1 tablet by mouth daily.    [provider]  losartan  (COZAAR ) 50 MG tablet Take 1 tablet (50 mg total) by mouth  daily. 04/07/24   Lemon Raisin, MD  Multiple Vitamins-Minerals (ONE-A-DAY WOMENS 50 PLUS PO) Take 1 tablet by mouth daily.    [provider]  pantoprazole  (PROTONIX ) 40 MG tablet Take 1 tablet (40 mg total) by mouth daily. 04/07/24   Lemon Raisin, MD  triamcinolone  cream (KENALOG ) 0.1 % Apply 1 Application topically 2 (two) times daily. 10/15/23   [provider]    Physical Exam   Triage Vital Signs: ED Triage Vitals [05/01/24 0303]  Encounter Vitals Group     BP 139/78     Girls Systolic BP Percentile      Girls Diastolic BP Percentile      Boys Systolic BP Percentile      Boys Diastolic BP Percentile      Pulse Rate 78     Resp 14     Temp (!) 97.3 F (36.3 C)     Temp Source Oral     SpO2 99 %     Weight      Height      Head Circumference      Peak Flow      Pain Score      Pain Loc      Pain Education      Exclude from Growth Chart  Most recent vital signs: Vitals:   05/01/24 0701 05/01/24 0726  BP: (!) 172/92   Pulse: 73   Resp: 18   Temp: 97.8 F (36.6 C)   SpO2: 98% 99%    CONSTITUTIONAL: Alert, responds appropriately to questions.  Elderly, appears uncomfortable HEAD: Normocephalic, atraumatic EYES: Conjunctivae clear, pupils appear equal, sclera nonicteric ENT: normal nose; moist mucous membranes NECK: Supple, normal ROM CARD: RRR; S1 and S2 appreciated RESP: Normal chest excursion without splinting or tachypnea; breath sounds clear and equal bilaterally; no wheezes, no rhonchi, no rales, no hypoxia or respiratory distress, speaking full sentences ABD/GI: Non-distended; soft, mild tenderness throughout the abdomen and epigastric region without guarding or rebound, negative Murphy sign BACK: The back appears normal EXT: Normal ROM in all joints; no deformity noted, no edema, no calf tenderness or calf swelling, 2+ DP and radial pulses bilaterally SKIN: Normal color for age and race; warm; no rash on exposed skin NEURO: Moves all  extremities equally, normal speech, no facial asymmetry PSYCH: The patient's mood and manner are appropriate.   ED Results / Procedures / Treatments   LABS: (all labs ordered are listed, but only abnormal results are displayed) Labs Reviewed  CBC WITH DIFFERENTIAL/PLATELET - Abnormal; Notable for the following components:      Result Value   WBC 11.2 (*)    Hemoglobin 11.7 (*)    HCT 35.0 (*)    Neutro Abs 8.5 (*)    All other components within normal limits  COMPREHENSIVE METABOLIC PANEL WITH GFR - Abnormal; Notable for the following components:   Glucose, Bld 114 (*)    BUN 24 (*)    All other components within normal limits  URINALYSIS, ROUTINE W REFLEX MICROSCOPIC - Abnormal; Notable for the following components:   Color, Urine YELLOW (*)    APPearance CLEAR (*)    All other components within normal limits  TROPONIN T, HIGH SENSITIVITY - Abnormal; Notable for the following components:   Troponin T High Sensitivity 20 (*)    All other components within normal limits  LIPASE, BLOOD  MAGNESIUM   TROPONIN T, HIGH SENSITIVITY     EKG:    Date: 05/01/2024  Rate: 79  Rhythm: normal sinus rhythm  QRS Axis: normal  Intervals: normal  ST/T Wave abnormalities: normal  Conduction Disutrbances: none  Narrative Interpretation: unremarkable     RADIOLOGY: My personal review and interpretation of imaging: CT and ultrasound show gallbladder wall thickening, gallstones, trace pericholecystic fluid.  I have personally reviewed all radiology reports.   US  ABDOMEN LIMITED RUQ (LIVER/GB) Result Date: 05/01/2024 CLINICAL DATA:  Abdominal pain. Concern for acute cholecystitis on CT imaging earlier today. EXAM: ULTRASOUND ABDOMEN LIMITED RIGHT UPPER QUADRANT COMPARISON:  CT earlier today. FINDINGS: Gallbladder: Multiple gallstones identified measuring up to at least 11 mm in size. Gallbladder wall is thick and irregular measuring up to 7-8 mm. No substantial pericholecystic fluid.  Sonographer reports no sonographic Murphy sign. Common bile duct: Diameter: 5 mm Liver: 10 mm left hepatic cyst identified, as noted on CT earlier today. Portal vein is patent on color Doppler imaging with normal direction of blood flow towards the liver. Other: None. IMPRESSION: 1. Cholelithiasis with gallbladder wall thickening. No sonographic Murphy sign or pericholecystic fluid. Imaging features raise distinct concern for acute cholecystitis. No biliary dilatation. Electronically Signed   By: Camellia Candle M.D.   On: 05/01/2024 06:54   CT Angio Chest/Abd/Pel for Dissection W and/or Wo Contrast Result Date: 05/01/2024 EXAM: CTA CHEST, ABDOMEN AND  PELVIS WITH AND WITHOUT CONTRAST 05/01/2024 04:03:06 AM TECHNIQUE: CTA of the chest was performed with and without the administration of intravenous contrast. CTA of the abdomen and pelvis was performed with the administration of intravenous contrast. Multiplanar reformatted images are provided for review. MIP images are provided for review. Automated exposure control, iterative reconstruction, and/or weight based adjustment of the mA/kV was utilized to reduce the radiation dose to as low as reasonably achievable. COMPARISON: None available. CLINICAL HISTORY: Severe chest pain that radiates into the back now with abdominal pain and leg cramping. FINDINGS: VASCULATURE: AORTA: The aorta is normal in course, caliber, and enhancement. No aneurysm, dissection, or stenosis is seen. There are mild calcific plaques in the descending thoracic aorta. Trace calcification in the abdominal aorta. PULMONARY ARTERIES: The pulmonary arteries are well opacified and normal in caliber without evidence of embolus. GREAT VESSELS OF AORTIC ARCH: The great vessels branch normally and are clear. No dissection. No arterial occlusion or significant stenosis. 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 SUPERIOR MESENTERIC ARTERY: No acute finding. No occlusion or significant stenosis. INFERIOR MESENTERIC ARTERY: No acute finding. No occlusion or significant stenosis. RENAL ARTERIES: No acute finding. No occlusion or significant stenosis. ILIAC ARTERIES: The iliac arteries are tortuous but otherwise normal. No occlusion or significant stenosis. CHEST: MEDIASTINUM: The heart is slightly enlarged. No pericardial effusion or visible coronary calcification. No mediastinal lymphadenopathy. The pulmonary veins are nondilated. There is a small hiatal hernia. Moderate circumferential thickening in the distal thoracic esophagus is noted, warranting endoscopic follow-up. LUNGS AND PLEURA: There are mild biapical scarring changes in the lungs. Mild central bronchial thickening but no bronchial impactions. There are scattered linear scarlike opacities in the lung bases. There is no consolidation or effusion. There is a 4 mm rounded left upper lobe nodule on series 6, axial 49, level of the carina. There are no further nodules. THORACIC BONES AND SOFT TISSUES: At T6-T7, there is a right paracentral calcified disc extrusion causing a mild compression of the right hemicord. At T8-T9, a sizable central/left paracentral calcified disc extrusion causes more significant spinal canal stenosis and compressive effect. There is osteopenia, kyphosis and degenerative changes of the thoracic spine. There is a 1.8 cm heterogeneous nodule in the mid pole of the right lobe of the thyroid  gland. Nonemergent follow-up ultrasound is recommended. No acute soft tissue abnormality. ABDOMEN AND PELVIS: LIVER: There is a 1 cm cyst medially in liver segment 5 on series 5 axial 147, with a Hounsfield density of 19.5. No follow up imaging is recommended. There is a 1.3 cm cyst in segment 2, Hounsfield density is -3.  No follow-up imaging is recommended. The remainder of the liver is homogeneous. GALLBLADDER AND BILE DUCTS: The gallbladder is mildly distended. There is thickening in the gallbladder wall, trace pericholecystic fluid. Findings concerning for cholecystitis. There are no calcified gallstones, but noncalcified stones can be missed with CT. No biliary dilatation is seen. SPLEEN: The spleen is unremarkable. PANCREAS: The pancreas is unremarkable. ADRENAL GLANDS: There is no adrenal mass. Bilateral adrenal glands demonstrate no acute abnormality. KIDNEYS, URETERS AND BLADDER: There is no renal mass. There is mild cortical thinning in both kidneys. No stones in the kidneys or ureters. No hydronephrosis. No perinephric or periureteral stranding. Urinary bladder is unremarkable. GI AND BOWEL: Stomach  and duodenal sweep demonstrate no acute abnormality. There is no bowel obstruction or inflammation. The appendix is normal in caliber and well visible. There is no evidence of colitis or diverticulitis. No abnormal bowel wall thickening or distension. REPRODUCTIVE: The uterus and ovaries are normal in size. Reproductive organs are unremarkable. PERITONEUM AND RETROPERITONEUM: No ascites or free air. LYMPH NODES: No lymphadenopathy. ABDOMINAL BONES AND SOFT TISSUES: There is osteopenia, kyphosis and degenerative changes of the lumbar spine, at L4-L5 with advanced facet hypertrophy and severe acquired spinal stenosis, at L5-S1 with severe facet hypertrophy and moderate spinal stenosis, at both levels with grade 1 degenerative anterolisthesis. There is a small umbilical fat hernia. No acute soft tissue abnormality. IMPRESSION: 1. No evidence of aortic aneurysm or dissection, and no evidence of pulmonary embolism. 2. Findings concerning for acute cholecystitis. 3. Moderate circumferential thickening in the distal thoracic esophagus, warranting endoscopic follow-up. 4. Calcified disc extrusions at T6-T7 and T8-T9 with spinal canal  stenosis and cord compression, and severe acquired spinal canal stenosis at L4-L5. 5. 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 6. 1.8 cm heterogeneous nodule in the mid pole of the right lobe of the thyroid  gland, with nonemergent follow-up ultrasound recommended. 7. 4 mm left upper lobe pulmonary nodule, with no routine follow-up imaging recommended per Fleischner Society Guidelines if low risk; if high risk, optional non-contrast chest CT at 12 months. Electronically signed by: Francis Quam MD 05/01/2024 05:26 AM EST RP Workstation: [id number]     PROCEDURES:  Critical Care performed: No   .1-3 Lead EKG Interpretation  Performed by: Antoine Vandermeulen, Josette SAILOR, DO Authorized by: Granville Whitefield, Josette SAILOR, DO     Interpretation: normal     ECG rate:  78   ECG rate assessment: normal     Rhythm: sinus rhythm     Ectopy: none     Conduction: normal       IMPRESSION / MDM / ASSESSMENT AND PLAN / ED COURSE  I reviewed the triage vital signs and the nursing notes.    Patient here for chest pain that radiates into her back with hypertension.  The patient is on the cardiac monitor to evaluate for evidence of arrhythmia and/or significant heart rate changes.   DIFFERENTIAL DIAGNOSIS (includes but not limited to):   ACS, PE, dissection, aortic aneurysm, acid reflux, esophageal spasm, esophagitis, gastritis, GERD, H. pylori, peptic ulcer disease   Patient's presentation is most consistent with acute presentation with potential threat to life or bodily function.   PLAN: Will obtain labs, CT dissection study.  Patient declines any pain medication.  EKG nonischemic.  Blood pressure is improving.   MEDICATIONS GIVEN IN ED: Medications  0.9 %  sodium chloride  infusion ( Intravenous New Bag/Given 05/01/24 0658)  acetaminophen  (OFIRMEV ) IV 1,000 mg (has no administration in time range)  sodium  chloride 0.9 % bolus 1,000 mL (0 mLs Intravenous Stopped 05/01/24 0346)  ondansetron  (ZOFRAN ) injection 4 mg (4 mg Intravenous Given 05/01/24 0317)  iohexol  (OMNIPAQUE ) 350 MG/ML injection 100 mL (100 mLs Intravenous Contrast Given 05/01/24 0357)  pantoprazole  (PROTONIX ) injection 40 mg (40 mg Intravenous Given 05/01/24 0436)  alum & mag hydroxide-simeth (MAALOX/MYLANTA) 200-200-20 MG/5ML suspension 30 mL (30 mLs Oral Given 05/01/24 0434)  lidocaine  (XYLOCAINE ) 2 % viscous mouth solution 15 mL (15 mLs Mouth/Throat Given 05/01/24 0434)  piperacillin -tazobactam (ZOSYN ) IVPB 3.375 g (3.375 g Intravenous New Bag/Given 05/01/24 0655)     ED  COURSE: Patient reports she is feeling much better.  She declined morphine .  First troponin negative.  Normal electrolytes, LFTs and lipase.  Patient is complaining of some burning in her throat from vomiting.  Will give Protonix , GI cocktail.  CT scan reviewed and interpreted by myself and radiologist and is concerning for acute cholecystitis with wall thickening, trace pericholecystic fluid, gallbladder enlargement although no calcified stones.  Will obtain right upper quadrant ultrasound.  No dissection, aneurysm, PE.  6:40 AM  US  reviewed and interpreted by myself and shows gallstones, wall thickening, pericholecystic fluid.  She does have a slight leukocytosis but no fever.  Concern for acute cholecystitis as the cause of her symptoms today.  Will continue IV fluids, keep n.p.o. and give Zosyn .  Will discuss with general surgery.   Patient states pain is starting to increase but she declines any narcotics.  Agreeable to IV Tylenol .  CONSULTS: 6:45 AM  Discussed with general surgeon, Dr. Marinda.  They will see patient in consultation.  Will discuss with hospitalist for admission.    7:26 AM  Discussed with Dr. Laurita with hospitalist service for medical admission.   OUTSIDE RECORDS REVIEWED: Reviewed most recent internal medicine notes.       FINAL CLINICAL  IMPRESSION(S) / ED DIAGNOSES   Final diagnoses:  Nonspecific chest pain  Vomiting in adult  Esophagitis  Thyroid  nodule  Pulmonary nodule  Acute cholecystitis     Rx / DC Orders   ED Discharge Orders     None        Note:  This document was prepared using Dragon voice recognition software and may include unintentional dictation errors.   Christabell Loseke, Josette SAILOR, DO 05/01/24 [phone]  "

## 2024-05-01 NOTE — Anesthesia Preprocedure Evaluation (Signed)
"                                    Anesthesia Evaluation  Patient identified by MRN, date of birth, ID band Patient awake    Reviewed: Allergy & Precautions, NPO status , Patient's Chart, lab work & pertinent test results  History of Anesthesia Complications Negative for: history of anesthetic complications  Airway Mallampati: III  TM Distance: >3 FB Neck ROM: full    Dental no notable dental hx.    Pulmonary neg pulmonary ROS   Pulmonary exam normal        Cardiovascular hypertension, On Medications negative cardio ROS Normal cardiovascular exam     Neuro/Psych negative neurological ROS  negative psych ROS   GI/Hepatic Neg liver ROS,GERD  Poorly Controlled,,  Endo/Other  negative endocrine ROS    Renal/GU negative Renal ROS  negative genitourinary   Musculoskeletal   Abdominal   Peds  Hematology negative hematology ROS (+)   Anesthesia Other Findings Past Medical History: No date: Angina at rest No date: Arthritis No date: GERD (gastroesophageal reflux disease) No date: Hypertension  Past Surgical History: 2014: BACK SURGERY     Comment:  CYST REMOVAL No date: BREAST BIOPSY; Right     Comment:  neg No date: BREAST EXCISIONAL BIOPSY No date: BREAST EXCISIONAL BIOPSY; Right No date: DILATION AND CURETTAGE OF UTERUS 04/13/2020: TOTAL KNEE ARTHROPLASTY; Left     Comment:  Procedure: TOTAL KNEE ARTHROPLASTY;  Surgeon: Edie Norleen PARAS, MD;  Location: [facility] ORS;  Service: Orthopedics;                Laterality: Left; No date: TUBAL LIGATION  BMI    Body Mass Index: 33.78 kg/m      Reproductive/Obstetrics negative OB ROS                              Anesthesia Physical Anesthesia Plan  ASA: 3  Anesthesia Plan: General ETT   Post-op Pain Management: Ofirmev  IV (intra-op)*, Toradol  IV (intra-op)* and Dilaudid  IV   Induction: Intravenous and Rapid sequence  PONV Risk Score and Plan: 3 and  Ondansetron , Dexamethasone  and Treatment may vary due to age or medical condition  Airway Management Planned: Oral ETT  Additional Equipment:   Intra-op Plan:   Post-operative Plan: Extubation in OR  Informed Consent: I have reviewed the patients History and Physical, chart, labs and discussed the procedure including the risks, benefits and alternatives for the proposed anesthesia with the patient or authorized representative who has indicated his/her understanding and acceptance.     Dental Advisory Given  Plan Discussed with: Anesthesiologist, CRNA and Surgeon  Anesthesia Plan Comments: (Patient consented for risks of anesthesia including but not limited to:  - adverse reactions to medications - damage to eyes, teeth, lips or other oral mucosa - nerve damage due to positioning  - sore throat or hoarseness - Damage to heart, brain, nerves, lungs, other parts of body or loss of life  Patient voiced understanding and assent.)        Anesthesia Quick Evaluation  "

## 2024-05-01 NOTE — ED Notes (Signed)
 Pt sts she does not want morphine  at this time. Reports cp continues to improve. Now rating 4/10. Call bell within reach. Family at bedside.

## 2024-05-01 NOTE — TOC CM/SW Note (Signed)
 Transition of Care [facility]) CM/SW Note    Transition of Care [facility]) - Inpatient Brief Assessment   Patient Details  Name: NEKEISHA AURE MRN: [id number] Date of Birth: 1950/10/17  Transition of Care [facility]) CM/SW Contact:    Alfonso Rummer, LCSW Phone Number: 05/01/2024, 3:07 PM   Clinical Narrative:  Completed toc chart review. No toc needs identified please contact toc should needs arise.   Transition of Care Asessment: Insurance and Status: Insurance coverage has been reviewed Patient has primary care physician: Yes (LIANG, JESSICA) Home environment has been reviewed: single family home   Prior/Current Home Services: No current home services Social Drivers of Health Review: SDOH reviewed no interventions necessary Readmission risk has been reviewed: No Transition of care needs: no transition of care needs at this time

## 2024-05-01 NOTE — Anesthesia Procedure Notes (Signed)
 Procedure Name: Intubation Date/Time: 05/01/2024 5:02 PM  Performed by: Rosine Shona Jansky, CRNAPre-anesthesia Checklist: Patient identified, Emergency Drugs available, Suction available and Patient being monitored Patient Re-evaluated:Patient Re-evaluated prior to induction Oxygen Delivery Method: Circle system utilized Preoxygenation: Pre-oxygenation with 100% oxygen Induction Type: IV induction Ventilation: Mask ventilation without difficulty Laryngoscope Size: Glidescope and 3 Grade View: Grade II Tube type: Oral Tube size: 7.0 mm Number of attempts: 1 Airway Equipment and Method: Oral airway and Bougie stylet Placement Confirmation: ETT inserted through vocal cords under direct vision, positive ETCO2 and breath sounds checked- equal and bilateral Secured at: 21 cm Tube secured with: Tape Dental Injury: Teeth and Oropharynx as per pre-operative assessment  Difficulty Due To: Difficult Airway- due to limited oral opening and Difficult Airway-  due to edematous airway Comments: Atraumatic intubation X1.  Grade 1-2 with edematous airway noted.  Limited neck mobility.  Intubated with bougie.  ETT advanced easily over bougie.  O2 sat remained 98-99% throughout.  Easy mask airway.  OGT placed and stomach decompressed

## 2024-05-01 NOTE — Op Note (Signed)
 Robotic assisted laparoscopic Cholecystectomy  Pre-operative Diagnosis: Acute CHolecystitis  Post-operative Diagnosis: Same  Procedure:  Robotic assisted laparoscopic Cholecystectomy  Surgeon: Jayson Endow, MD  Anesthesia: Gen. with endotracheal tube  Findings: Mildly inflamed gallbladder, critical view of safety obtained  Estimated Blood Loss: 15cc       Specimens: Gallbladder           Complications: none   Procedure Details  The patient was seen again in the Holding Room. The benefits, complications, treatment options, and expected outcomes were discussed with the patient. The risks of bleeding, infection, recurrence of symptoms, failure to resolve symptoms, bile duct damage, bile duct leak, retained common bile duct stone, bowel injury, any of which could require further surgery and/or ERCP, stent, or papillotomy were reviewed with the patient. The likelihood of improving the patient's symptoms with return to their baseline status is good.  The patient and/or family concurred with the proposed plan, giving informed consent.  The patient was taken to Operating Room, identified  and the procedure verified as robotic Cholecystectomy.  A Time Out was held and the above information confirmed.  Prior to the induction of general anesthesia, antibiotic prophylaxis was administered. VTE prophylaxis was in place. General endotracheal anesthesia was then administered and tolerated well. After the induction, the abdomen was prepped with Chloraprep and draped in the sterile fashion. The patient was positioned in the supine position.  A veress needle was inserted into the abdomen using standard drop technique. An 8mm infra-umbilical robotic port was then placed under direct visualization. There was no injury noted at the site of veress needle insertion. Two right sided abdominal 8mm ports followed by an 8mm left abdominal robotic ports were placed under direct visualization. The left sided  abdominal port was then upsized to a 12mm robotic port.  The patient was positioned  in reverse Trendelenburg, robot was brought to the surgical field and docked in the standard fashion.  We made sure all the instrumentation was kept indirect view at all times and that there were no collision between the arms. I scrubbed out and went to the console.  The gallbladder was identified, the fundus grasped and retracted cephalad. Adhesions were lysed bluntly. The infundibulum was grasped and retracted laterally, exposing the peritoneum overlying the triangle of Calot. This was then divided and exposed in a blunt fashion. An extended critical view of the cystic duct and cystic artery was obtained.  The cystic duct was clearly identified and bluntly dissected.   Artery and duct were double clipped and divided. Using ICG cholangiography we visualized the cystic duct. The gallbladder was taken from the gallbladder fossa in a retrograde fashion with the electrocautery.  Hemostasis was achieved with the electrocautery. nspection of the right upper quadrant was performed. No bleeding, bile duct injury or leak, or bowel injury was noted. Robotic instruments and robotic arms were undocked in the standard fashion.  I scrubbed back in.  The gallbladder was removed and placed in an Endocatch bag.   The left lower quadrant fascia was then closed with a 0 vicryl using a suture needle passer. The pre-peritoneal space was then infiltrated with liposomal bupivicaine and marcaine  solution. Pneumoperitoneum was released.  4-0 subcuticular Monocryl was used to close the skin. Dermabond was  applied.  The patient was then extubated and brought to the recovery room in stable condition. Sponge, lap, and needle counts were correct at closure and at the conclusion of the case.  Jayson Endow, M.D. [facility]

## 2024-05-01 NOTE — Transfer of Care (Signed)
 Immediate Anesthesia Transfer of Care Note  Patient: Lauren Leblanc  Procedure(s) Performed: CHOLECYSTECTOMY, ROBOT-ASSISTED, LAPAROSCOPIC  Patient Location: PACU  Anesthesia Type:General  Level of Consciousness: drowsy  Airway & Oxygen Therapy: Patient Spontanous Breathing and Patient connected to face mask oxygen  Post-op Assessment: Report given to RN  Post vital signs: stable  Last Vitals:  Vitals Value Taken Time  BP 154/94 05/01/24 18:12  Temp    Pulse 88 05/01/24 18:14  Resp 15 05/01/24 18:14  SpO2 94 % 05/01/24 18:14  Vitals shown include unfiled device data.  Last Pain:  Vitals:   05/01/24 1623  TempSrc: Temporal  PainSc: 5          Complications: No notable events documented.

## 2024-05-01 NOTE — ED Triage Notes (Signed)
 Pt BIB [facility] c/o chest pain that radiates into the back and abdomen. Associated with SOB, At home SBP > 200. Given 324 ASA and NTG 0.4 spray x 2 in route. Pt reports pain 10/10 to 7/10 following NTG. Hx Angina.

## 2024-05-01 NOTE — ED Notes (Signed)
 Informed consent for surgery obtained, this RN witnessed. Form left at bedside.

## 2024-05-01 NOTE — Plan of Care (Signed)

## 2024-05-01 NOTE — Anesthesia Postprocedure Evaluation (Signed)
"   Anesthesia Post Note  Patient: Lauren Leblanc  Procedure(s) Performed: CHOLECYSTECTOMY, ROBOT-ASSISTED, LAPAROSCOPIC  Patient location during evaluation: PACU Anesthesia Type: General Level of consciousness: awake and alert Pain management: pain level controlled Vital Signs Assessment: post-procedure vital signs reviewed and stable Respiratory status: spontaneous breathing, nonlabored ventilation, respiratory function stable and patient connected to nasal cannula oxygen Cardiovascular status: blood pressure returned to baseline and stable Postop Assessment: no apparent nausea or vomiting Anesthetic complications: no   No notable events documented.   Last Vitals:  Vitals:   05/01/24 1915 05/01/24 1949  BP: 137/79 (!) 150/88  Pulse: 74 76  Resp: 11 14  Temp:  36.7 C  SpO2: 92% 90%    Last Pain:  Vitals:   05/01/24 2241  TempSrc:   PainSc: 7                  [name]      "

## 2024-05-01 NOTE — Consult Note (Signed)
 Patient ID: Lauren Leblanc, female   DOB: October 15, 1950, 74 y.o.   MRN: [id number] CC: Abdominal Pain History of Present Illness Lauren Leblanc is a 74 y.o. female with past medical history significant for hypertension who presents in consultation for abdominal pain.  The patient reports that last night about 9:00 she ate an orange and then developed upper abdominal pain.  Said that it started in her epigastric region.  She says that it also radiated to her back and was sharp in nature.  This was associated with nausea and vomiting but she did not have any fevers.  The pain persisted and she was hypertensive so she came to the ED for evaluation.  She had a CTA that showed no negative aortic pathology and was concerning for distention of the gallbladder.  She did have an ultrasound that showed thickened gallbladder wall.  She reports that she continues to have pain this morning but is more in her lower abdomen.  She is not on any blood thinners.  She has only had a tubal ligation as far as abdominal surgery.  Past Medical History Past Medical History:  Diagnosis Date   Angina at rest    Arthritis    GERD (gastroesophageal reflux disease)    Hypertension        Past Surgical History:  Procedure Laterality Date   BACK SURGERY  2014   CYST REMOVAL   BREAST BIOPSY Right    neg   BREAST EXCISIONAL BIOPSY     BREAST EXCISIONAL BIOPSY Right    DILATION AND CURETTAGE OF UTERUS     TOTAL KNEE ARTHROPLASTY Left 04/13/2020   Procedure: TOTAL KNEE ARTHROPLASTY;  Surgeon: Edie Norleen PARAS, MD;  Location: [facility] ORS;  Service: Orthopedics;  Laterality: Left;   TUBAL LIGATION      Allergies[1]  Current Facility-Administered Medications  Medication Dose Route Frequency Provider Last Rate Last Admin   0.9 %  sodium chloride  infusion   Intravenous Continuous Ward, Josette SAILOR, DO 75 mL/hr at 05/01/24 0658 New Bag at 05/01/24 0658   Current Outpatient Medications  Medication Sig Dispense Refill    amLODipine  (NORVASC ) 5 MG tablet Take 1 tablet (5 mg total) by mouth daily. 90 tablet 1   Cholecalciferol  (VITAMIN D -3 PO) Take 1 tablet by mouth daily.     Docusate Sodium  (COLACE PO) Take 1 tablet by mouth daily.     losartan  (COZAAR ) 50 MG tablet Take 1 tablet (50 mg total) by mouth daily. 90 tablet 1   Multiple Vitamins-Minerals (ONE-A-DAY WOMENS 50 PLUS PO) Take 1 tablet by mouth daily.     pantoprazole  (PROTONIX ) 40 MG tablet Take 1 tablet (40 mg total) by mouth daily. 90 tablet 1   triamcinolone  cream (KENALOG ) 0.1 % Apply 1 Application topically 2 (two) times daily.      Family History Family History  Problem Relation Age of Onset   Coronary artery disease Mother 29   Hypertension Mother    Stroke Father    Diabetes Father    Hypertension Sister    Allergies Sister    Breast cancer Maternal Aunt        3 mat aunts   Breast cancer Maternal Aunt    Breast cancer Maternal Aunt        Social History Social History[2]  Denies tobacco use or drinking   ROS Full ROS of systems performed and is otherwise negative there than what is stated in the HPI  Physical Exam Blood pressure ROLLEN)  172/92, pulse 73, temperature 97.8 F (36.6 C), temperature source Oral, resp. rate 18, height 5' 5 (1.651 m), weight 92.1 kg, SpO2 99%.  Alert and oriented x 3, normal work of breathing on room air, regular rate and rhythm, abdomen soft, mild tenderness in the right lower quadrant in the right hemiabdomen, no rebound tenderness or guarding, negative Murphy sign, moving all extremities spontaneously  Data Reviewed Labs showing a leukocytosis.  T bilirubin is normal.  CT scan reviewed and there is some thickening of the gallbladder wall I do not see a lot of inflammation in the mesentery around this or pericholecystic fluid.  No stones seen on CT scan but on ultrasound there are stones within the gallbladder lumen.  There does seem to be a thickened gallbladder wall.  I have personally reviewed  the patient's imaging and medical records.    Assessment    Patient with acute cholecystitis   Plan    Plan for robotic assisted cholecystectomy with ICG.  I discussed the risk, benefits and alternatives of the procedure including risk of infection, bleeding, bile leak, retained stone, injury to common bile duct and need for open procedure.  She understands his risk and wishes to proceed with surgery.    Lauren Leblanc 05/01/2024, 8:07 AM     [1]  Allergies Allergen Reactions   Lisinopril  Other (See Comments)    Chest pain, dizziness, cough   Codeine  Hives    hair crawling    Hydrochlorothiazide  Anxiety    Jittery feeling   [2]  Social History Tobacco Use   Smoking status: Never    Passive exposure: Past   Smokeless tobacco: Never  Vaping Use   Vaping status: Never Used  Substance Use Topics   Alcohol use: No    Alcohol/week: 0.0 standard drinks of alcohol   Drug use: No

## 2024-05-01 NOTE — ED Notes (Signed)
 Pt moved to room 12 via stretcher accompanied by husband and sister. Report given to [facility]

## 2024-05-01 NOTE — H&P (Signed)
 " History and Physical    Lauren Leblanc FMW:[id number] DOB: [birth year] DOA: 05/01/2024  PCP: Lemon Raisin, MD (Confirm with patient/family/NH records and if not entered, this has to be entered at [facility] point of entry) Patient coming from: Home  I have personally briefly reviewed patient's old medical records in [facility]  Chief Complaint: Belly still hurts  HPI: Lauren Leblanc is a 74 y.o. female with medical history significant of HTN, GERD, remote Hx of angina, presented with new onset of abdominal pain.  Symptoms started yesterday evening, after dinner patient started to have cramping-like abdominal pain, initially was epigastric, migrated down to left lower quadrant, then radiated to middle back, also associate with nausea and vomited x 2 of stomach content nonbloody nonbilious.  No diarrhea denied any fever or chills.  She denied any chest pain or shortness of breath or palpitations.  The pain has been constant for few hours before patient came to ED.  She reported that recently she had been feeling easily get tired and she reported to her PCP, who changed her BP meds, metoprolol  was removed and patient was started on losartan , this was about 2 to 3 weeks ago.  Since then the patient has feeling much improved no more lethargy, and she checks her blood pressure at home and found SBP 120 and 130 range.  Last night before coming to the hospital she checked her blood pressure again and found SBP in 200s. ED Course: Afebrile, nontachycardic blood pressure 130/60 O2 saturation 97% on room air.  CTA dissection study negative for aneurysm or dissections in the chest or abdomen, but concerning signs of acute cholecystitis.  Blood work showed WBC 7.7 hemoglobin 16 BUN 16 creatinine 0.7.  Troponin 17> 20.  Review of Systems: As per HPI otherwise 14 point review of systems negative.    Past Medical History:  Diagnosis Date   Angina at rest    Arthritis    GERD (gastroesophageal  reflux disease)    Hypertension     Past Surgical History:  Procedure Laterality Date   BACK SURGERY  2014   CYST REMOVAL   BREAST BIOPSY Right    neg   BREAST EXCISIONAL BIOPSY     BREAST EXCISIONAL BIOPSY Right    DILATION AND CURETTAGE OF UTERUS     TOTAL KNEE ARTHROPLASTY Left 04/13/2020   Procedure: TOTAL KNEE ARTHROPLASTY;  Surgeon: Edie Norleen PARAS, MD;  Location: [facility] ORS;  Service: Orthopedics;  Laterality: Left;   TUBAL LIGATION       reports that she has never smoked. She has been exposed to tobacco smoke. She has never used smokeless tobacco. She reports that she does not drink alcohol and does not use drugs.  Allergies[1]  Family History  Problem Relation Age of Onset   Coronary artery disease Mother 65   Hypertension Mother    Stroke Father    Diabetes Father    Hypertension Sister    Allergies Sister    Breast cancer Maternal Aunt        3 mat aunts   Breast cancer Maternal Aunt    Breast cancer Maternal Aunt      Prior to Admission medications  Medication Sig Start Date End Date Taking? Authorizing Provider  amLODipine  (NORVASC ) 5 MG tablet Take 1 tablet (5 mg total) by mouth daily. 12/20/23  Yes Lemon Raisin, MD  Cholecalciferol  (VITAMIN D -3 PO) Take 1 tablet by mouth daily.   Yes [provider]  Docusate Sodium  (  COLACE PO) Take 1 tablet by mouth daily.   Yes [provider]  losartan  (COZAAR ) 50 MG tablet Take 1 tablet (50 mg total) by mouth daily. 04/07/24  Yes Lemon Raisin, MD  Multiple Vitamins-Minerals (ONE-A-DAY WOMENS 50 PLUS PO) Take 1 tablet by mouth daily.   Yes [provider]  pantoprazole  (PROTONIX ) 40 MG tablet Take 1 tablet (40 mg total) by mouth daily. 04/07/24  Yes Lemon Raisin, MD  triamcinolone  cream (KENALOG ) 0.1 % Apply 1 Application topically 2 (two) times daily. 10/15/23  Yes [provider]    Physical Exam: Vitals:   05/01/24 0527 05/01/24 0539 05/01/24 0701 05/01/24 0726  BP:   (!)  172/92   Pulse:  73 73   Resp:  16 18   Temp:  (!) 97.3 F (36.3 C) 97.8 F (36.6 C)   TempSrc:  Oral Oral   SpO2:  100% 98% 99%  Weight: 92.1 kg     Height: 5' 5 (1.651 m)       Constitutional: NAD, calm, comfortable Vitals:   05/01/24 0527 05/01/24 0539 05/01/24 0701 05/01/24 0726  BP:   (!) 172/92   Pulse:  73 73   Resp:  16 18   Temp:  (!) 97.3 F (36.3 C) 97.8 F (36.6 C)   TempSrc:  Oral Oral   SpO2:  100% 98% 99%  Weight: 92.1 kg     Height: 5' 5 (1.651 m)      Eyes: PERRL, lids and conjunctivae normal ENMT: Mucous membranes are moist. Posterior pharynx clear of any exudate or lesions.Normal dentition.  Neck: normal, supple, no masses, no thyromegaly Respiratory: clear to auscultation bilaterally, no wheezing, no crackles. Normal respiratory effort. No accessory muscle use.  Cardiovascular: Regular rate and rhythm, no murmurs / rubs / gallops. No extremity edema. 2+ pedal pulses. No carotid bruits.  Abdomen: Tenderness on periumbilical area, no rebound okay, no masses palpated. No hepatosplenomegaly. Bowel sounds positive.  Musculoskeletal: no clubbing / cyanosis. No joint deformity upper and lower extremities. Good ROM, no contractures. Normal muscle tone.  Skin: no rashes, lesions, ulcers. No induration Neurologic: CN 2-12 grossly intact. Sensation intact, DTR normal. Strength 5/5 in all 4.  Psychiatric: Normal judgment and insight. Alert and oriented x 3. Normal mood.     Labs on Admission: I have personally reviewed following labs and imaging studies  CBC: Recent Labs  Lab 05/01/24 0309  WBC 11.2*  NEUTROABS 8.5*  HGB 11.7*  HCT 35.0*  MCV 88.6  PLT 259   Basic Metabolic Panel: Recent Labs  Lab 05/01/24 0309  NA 141  K 3.9  CL 104  CO2 27  GLUCOSE 114*  BUN 24*  CREATININE 0.89  CALCIUM  10.2  MG 2.1   GFR: Estimated Creatinine Clearance: 65 mL/min (by C-G formula based on SCr of 0.89 mg/dL). Liver Function Tests: Recent Labs  Lab  05/01/24 0309  AST 22  ALT 15  ALKPHOS 89  BILITOT 0.3  PROT 6.9  ALBUMIN 4.4   Recent Labs  Lab 05/01/24 0309  LIPASE 25   No results for input(s): AMMONIA in the last 168 hours. Coagulation Profile: No results for input(s): INR, PROTIME in the last 168 hours. Cardiac Enzymes: No results for input(s): CKTOTAL, CKMB, CKMBINDEX, TROPONINI in the last 168 hours. BNP (last 3 results) No results for input(s): PROBNP in the last 8760 hours. HbA1C: No results for input(s): HGBA1C in the last 72 hours. CBG: No results for input(s): GLUCAP in the last  168 hours. Lipid Profile: No results for input(s): CHOL, HDL, LDLCALC, TRIG, CHOLHDL, LDLDIRECT in the last 72 hours. Thyroid  Function Tests: No results for input(s): TSH, T4TOTAL, FREET4, T3FREE, THYROIDAB in the last 72 hours. Anemia Panel: No results for input(s): VITAMINB12, FOLATE, FERRITIN, TIBC, IRON, RETICCTPCT in the last 72 hours. Urine analysis:    Component Value Date/Time   COLORURINE YELLOW (A) 05/01/2024 0410   APPEARANCEUR CLEAR (A) 05/01/2024 0410   LABSPEC 1.027 05/01/2024 0410   PHURINE 5.0 05/01/2024 0410   GLUCOSEU NEGATIVE 05/01/2024 0410   HGBUR NEGATIVE 05/01/2024 0410   BILIRUBINUR NEGATIVE 05/01/2024 0410   BILIRUBINUR neg 10/11/2022 0942   KETONESUR NEGATIVE 05/01/2024 0410   PROTEINUR NEGATIVE 05/01/2024 0410   UROBILINOGEN 0.2 10/11/2022 0942   NITRITE NEGATIVE 05/01/2024 0410   LEUKOCYTESUR NEGATIVE 05/01/2024 0410    Radiological Exams on Admission: US  ABDOMEN LIMITED RUQ (LIVER/GB) Result Date: 05/01/2024 CLINICAL DATA:  Abdominal pain. Concern for acute cholecystitis on CT imaging earlier today. EXAM: ULTRASOUND ABDOMEN LIMITED RIGHT UPPER QUADRANT COMPARISON:  CT earlier today. FINDINGS: Gallbladder: Multiple gallstones identified measuring up to at least 11 mm in size. Gallbladder wall is thick and irregular measuring up to 7-8 mm. No  substantial pericholecystic fluid. Sonographer reports no sonographic Murphy sign. Common bile duct: Diameter: 5 mm Liver: 10 mm left hepatic cyst identified, as noted on CT earlier today. Portal vein is patent on color Doppler imaging with normal direction of blood flow towards the liver. Other: None. IMPRESSION: 1. Cholelithiasis with gallbladder wall thickening. No sonographic Murphy sign or pericholecystic fluid. Imaging features raise distinct concern for acute cholecystitis. No biliary dilatation. Electronically Signed   By: Camellia Candle M.D.   On: 05/01/2024 06:54   CT Angio Chest/Abd/Pel for Dissection W and/or Wo Contrast Result Date: 05/01/2024 EXAM: CTA CHEST, ABDOMEN AND PELVIS WITH AND WITHOUT CONTRAST 05/01/2024 04:03:06 AM TECHNIQUE: CTA of the chest was performed with and without the administration of intravenous contrast. CTA of the abdomen and pelvis was performed with the administration of intravenous contrast. Multiplanar reformatted images are provided for review. MIP images are provided for review. Automated exposure control, iterative reconstruction, and/or weight based adjustment of the mA/kV was utilized to reduce the radiation dose to as low as reasonably achievable. COMPARISON: None available. CLINICAL HISTORY: Severe chest pain that radiates into the back now with abdominal pain and leg cramping. FINDINGS: VASCULATURE: AORTA: The aorta is normal in course, caliber, and enhancement. No aneurysm, dissection, or stenosis is seen. There are mild calcific plaques in the descending thoracic aorta. Trace calcification in the abdominal aorta. PULMONARY ARTERIES: The pulmonary arteries are well opacified and normal in caliber without evidence of embolus. GREAT VESSELS OF AORTIC ARCH: The great vessels branch normally and are clear. No dissection. No arterial occlusion or significant stenosis. 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 SUPERIOR MESENTERIC ARTERY: No acute finding. No occlusion or significant stenosis. INFERIOR MESENTERIC ARTERY: No acute finding. No occlusion or significant stenosis. RENAL ARTERIES: No acute finding. No occlusion or significant stenosis. ILIAC ARTERIES: The iliac arteries are tortuous but otherwise normal. No occlusion or significant stenosis. CHEST: MEDIASTINUM: The heart is slightly enlarged. No pericardial effusion or visible coronary calcification. No mediastinal lymphadenopathy. The pulmonary veins are nondilated. There is a small hiatal hernia. Moderate circumferential  thickening in the distal thoracic esophagus is noted, warranting endoscopic follow-up. LUNGS AND PLEURA: There are mild biapical scarring changes in the lungs. Mild central bronchial thickening but no bronchial impactions. There are scattered linear scarlike opacities in the lung bases. There is no consolidation or effusion. There is a 4 mm rounded left upper lobe nodule on series 6, axial 49, level of the carina. There are no further nodules. THORACIC BONES AND SOFT TISSUES: At T6-T7, there is a right paracentral calcified disc extrusion causing a mild compression of the right hemicord. At T8-T9, a sizable central/left paracentral calcified disc extrusion causes more significant spinal canal stenosis and compressive effect. There is osteopenia, kyphosis and degenerative changes of the thoracic spine. There is a 1.8 cm heterogeneous nodule in the mid pole of the right lobe of the thyroid  gland. Nonemergent follow-up ultrasound is recommended. No acute soft tissue abnormality. ABDOMEN AND PELVIS: LIVER: There is a 1 cm cyst medially in liver segment 5 on series 5 axial 147, with a Hounsfield density of 19.5. No follow up imaging is recommended. There is a 1.3 cm cyst in  segment 2, Hounsfield density is -3. No follow-up imaging is recommended. The remainder of the liver is homogeneous. GALLBLADDER AND BILE DUCTS: The gallbladder is mildly distended. There is thickening in the gallbladder wall, trace pericholecystic fluid. Findings concerning for cholecystitis. There are no calcified gallstones, but noncalcified stones can be missed with CT. No biliary dilatation is seen. SPLEEN: The spleen is unremarkable. PANCREAS: The pancreas is unremarkable. ADRENAL GLANDS: There is no adrenal mass. Bilateral adrenal glands demonstrate no acute abnormality. KIDNEYS, URETERS AND BLADDER: There is no renal mass. There is mild cortical thinning in both kidneys. No stones in the kidneys or ureters. No hydronephrosis. No perinephric or periureteral stranding. Urinary bladder is unremarkable. GI AND BOWEL: Stomach and duodenal sweep demonstrate no acute abnormality. There is no bowel obstruction or inflammation. The appendix is normal in caliber and well visible. There is no evidence of colitis or diverticulitis. No abnormal bowel wall thickening or distension. REPRODUCTIVE: The uterus and ovaries are normal in size. Reproductive organs are unremarkable. PERITONEUM AND RETROPERITONEUM: No ascites or free air. LYMPH NODES: No lymphadenopathy. ABDOMINAL BONES AND SOFT TISSUES: There is osteopenia, kyphosis and degenerative changes of the lumbar spine, at L4-L5 with advanced facet hypertrophy and severe acquired spinal stenosis, at L5-S1 with severe facet hypertrophy and moderate spinal stenosis, at both levels with grade 1 degenerative anterolisthesis. There is a small umbilical fat hernia. No acute soft tissue abnormality. IMPRESSION: 1. No evidence of aortic aneurysm or dissection, and no evidence of pulmonary embolism. 2. Findings concerning for acute cholecystitis. 3. Moderate circumferential thickening in the distal thoracic esophagus, warranting endoscopic follow-up. 4. Calcified disc extrusions  at T6-T7 and T8-T9 with spinal canal stenosis and cord compression, and severe acquired spinal canal stenosis at L4-L5. 5. 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 6. 1.8 cm heterogeneous nodule in the mid pole of the right lobe of the thyroid  gland, with nonemergent follow-up ultrasound recommended. 7. 4 mm left upper lobe pulmonary nodule, with no routine follow-up imaging recommended per Fleischner Society Guidelines if low risk; if high risk, optional non-contrast chest CT at 12 months. Electronically signed by: Francis Quam MD 05/01/2024 05:26 AM EST RP Workstation: [id number]    EKG: Pending  Assessment/Plan Principal Problem:   Acute cholecystitis  (please populate well all problems here in  Problem List. (For example, if patient is on BP meds at home and you resume or decide to hold them, it is a problem that needs to be her. Same for CAD, COPD, HLD and so on)  Acute cholecystitis - OR this afternoon - Unasyn  - As needed Zofran  and Dilaudid  - Medically cleared for incoming cholecystectomy with acceptable risks.  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  Estimated she can tolerate 4 METS activity.  No further cardiac workup needed. - Other DDx, dissection rule out by CTA, her pain appears to be noncardiac.  She had a rather remote stress test done in 2019 for anginal-like chest pain she had significant stressful events in her life.  But this time she described the pain is very different in nature.  GERD - Poorly controlled, reported frequent heartburns and CT showed inflamed distal esophagus. - I recommended increase PPI to twice daily and patient agreed.  HTN - Stable, continue losartan  25 mg daily   DVT prophylaxis: Lovenox , start after surgery Code Status: Full code Family Communication: None at  bedside Disposition Plan: Expect less than 2 midnight hospital stay Consults called: General Surgery Admission status: Telemetry observation   Cort ONEIDA Mana MD [facility] Pager [phone]  05/01/2024, 10:18 AM       [1]  Allergies Allergen Reactions   Lisinopril  Other (See Comments)    Chest pain, dizziness, cough   Codeine  Hives    hair crawling    Hydrochlorothiazide  Anxiety    Jittery feeling    "

## 2024-05-01 NOTE — Progress Notes (Signed)
" ° ° °  PROCEDURAL EXPEDITER PROGRESS NOTE  Patient Name: Lauren Leblanc  DOB:1951/03/17 Date of Admission: 05/01/2024  Date of Assessment:05/01/2024   -------------------------------------------------------------------------------------------------------------------   Brief clinical summary: 74 yr old lady with Hx of HTN, GERD, remote Hx of angina came in with abd pain.  Orders in place:  Yes   Communication with surgical team if no orders: n/a  Labs, test, and orders reviewed: yes  Requires surgical clearance:  Yes  What type of clearance: n/a  Clearance received: n/a  Barriers noted:n/a   Intervention provided by [facility] team: n/a  Barrier resolved:  not applicable   -------------------------------------------------------------------------------------------------------------------  [employer], Lauren Leblanc Please contact us  directly via secure chat (search for [facility]) or by calling us  at [phone] [facility]). "

## 2024-05-02 DIAGNOSIS — K810 Acute cholecystitis: Secondary | ICD-10-CM | POA: Diagnosis not present

## 2024-05-02 MED ORDER — DOCUSATE SODIUM 50 MG/5ML PO LIQD
50.0000 mg | Freq: Every day | ORAL | Status: DC
  Administered 2024-05-02: 50 mg via ORAL
  Filled 2024-05-02: qty 10

## 2024-05-02 NOTE — Progress Notes (Signed)
 Patient s/p lap chole. Doing well, tolerated CLD. Pain controlled. On exam abdomen is soft, appropriately tender. Bruise around left lower quadrant incision. Will advance to soft diet, okay for discharge without abx from surgical perspective. Will place DC instructions in DC tab and follow up in 2 weeks.

## 2024-05-02 NOTE — Discharge Instructions (Signed)
 You have surgical glue over your incisions. This will peel off over the next 2-4 weeks. You may shower on post-operative day one, please let the warm water run over your incisions and pat dry. No lifting greater than 10-15 lbs for 4 weeks. Please do not submerge the wounds in water for 2 weeks. You may use tylenol , ibuprofen  and ice as needed for pain. You will follow up in office in 2 weeks.

## 2024-05-02 NOTE — Plan of Care (Signed)
" °  Problem: Education: Goal: Knowledge of General Education information will improve Description: Including pain rating scale, medication(s)/side effects and non-pharmacologic comfort measures Outcome: Progressing   Problem: Clinical Measurements: Goal: Ability to maintain clinical measurements within normal limits will improve Outcome: Progressing Goal: Will remain free from infection Outcome: Progressing Goal: Diagnostic test results will improve Outcome: Progressing Goal: Respiratory complications will improve Outcome: Progressing Goal: Cardiovascular complication will be avoided Outcome: Progressing   Problem: Activity: Goal: Risk for activity intolerance will decrease Outcome: Progressing   Problem: Nutrition: Goal: Adequate nutrition will be maintained Outcome: Progressing   Problem: Coping: Goal: Level of anxiety will decrease Outcome: Progressing   Problem: Elimination: Goal: Will not experience complications related to urinary retention Outcome: Progressing   Problem: Pain Managment: Goal: General experience of comfort will improve and/or be controlled Outcome: Progressing   Problem: Safety: Goal: Ability to remain free from injury will improve Outcome: Progressing   Problem: Skin Integrity: Goal: Risk for impaired skin integrity will decrease Outcome: Progressing   Problem: Education: Goal: Knowledge of the prescribed therapeutic regimen will improve Outcome: Progressing   Problem: Neurological: Goal: Will regain or maintain usual level of consciousness Outcome: Progressing   Problem: Clinical Measurements: Goal: Ability to maintain clinical measurements within normal limits Outcome: Progressing Goal: Postoperative complications will be avoided or minimized Outcome: Progressing   Problem: Respiratory: Goal: Will regain and/or maintain adequate ventilation Outcome: Progressing Goal: Respiratory status will improve Outcome: Progressing    Problem: Urinary Elimination: Goal: Will remain free from infection Outcome: Progressing Goal: Ability to achieve and maintain adequate urine output Outcome: Progressing   "

## 2024-05-02 NOTE — Care Management Obs Status (Signed)
 MEDICARE OBSERVATION STATUS NOTIFICATION   Patient Details  Name: Lauren Leblanc MRN: [id number] Date of Birth: [birth year]   Medicare Observation Status Notification Given:   no  Patient discharged    Rojelio SHAUNNA Rattler 05/02/2024, 11:46 AM

## 2024-05-02 NOTE — Discharge Summary (Signed)
 Physician Discharge Summary  Genna Casimir Kathan FMW:[id number] DOB: 06-May-1950 DOA: 05/01/2024  PCP: Lemon Raisin, MD  Admit date: 05/01/2024 Discharge date: 05/02/2024  Admitted From: Home Disposition:  Home  Recommendations for Outpatient Follow-up:  Follow up with PCP in 1-2 weeks   Home Health:No  Equipment/Devices:None   Discharge Condition:Stable  CODE STATUS:FULL  Diet recommendation: Soft/bland  Brief/Interim Summary:   74 y.o. female with medical history significant of HTN, GERD, remote Hx of angina, presented with new onset of abdominal pain.   Symptoms started yesterday evening, after dinner patient started to have cramping-like abdominal pain, initially was epigastric, migrated down to left lower quadrant, then radiated to middle back, also associate with nausea and vomited x 2 of stomach content nonbloody nonbilious.  No diarrhea denied any fever or chills.  She denied any chest pain or shortness of breath or palpitations.  The pain has been constant for few hours before patient came to ED.  She reported that recently she had been feeling easily get tired and she reported to her PCP, who changed her BP meds, metoprolol  was removed and patient was started on losartan , this was about 2 to 3 weeks ago.  Since then the patient has feeling much improved no more lethargy, and she checks her blood pressure at home and found SBP 120 and 130 range.  Last night before coming to the hospital she checked her blood pressure again and found SBP in 200s.  Taken in OR by general surgery.  Underwent laparoscopic cholecystectomy.  Tolerated procedure well with no complications.  Seen by surgery on postoperative day #1.  Cleared for discharge without antibiotics.  Blood pressure control improved.  No changes made in home medication regimen.   Discharge Diagnoses:  Principal Problem:   Acute cholecystitis Status post laparoscopic cholecystectomy.  Seen by surgery on postoperative day 1.   Cleared for discharge.  No antibiotics indicated.  Blood pressure control improved.  No changes been on medication regimen.  Stable for discharge home.   Discharge Instructions  Discharge Instructions     Increase activity slowly   Complete by: As directed    No wound care   Complete by: As directed       Allergies as of 05/02/2024       Reactions   Lisinopril  Other (See Comments)   Chest pain, dizziness, cough   Codeine  Hives   hair crawling   Hydrochlorothiazide  Anxiety   Jittery feeling        Medication List     TAKE these medications    amLODipine  5 MG tablet Commonly known as: NORVASC  Take 1 tablet (5 mg total) by mouth daily.   COLACE PO Take 1 tablet by mouth daily.   losartan  50 MG tablet Commonly known as: COZAAR  Take 1 tablet (50 mg total) by mouth daily.   ONE-A-DAY WOMENS 50 PLUS PO Take 1 tablet by mouth daily.   pantoprazole  40 MG tablet Commonly known as: PROTONIX  Take 1 tablet (40 mg total) by mouth daily.   triamcinolone  cream 0.1 % Commonly known as: KENALOG  Apply 1 Application topically 2 (two) times daily.   VITAMIN D -3 PO Take 1 tablet by mouth daily.        Follow-up Information     Schulz, Zachary R, PA-C. Go in 2 week(s).   Specialty: Physician Assistant Why: Appointment scheduled for Wednesday 05/15/2023 @ 2:30 pm with Arthea Platt PA. Please arrive 15 minutes before appointment, bring insurance cards and list of medications. If need  to cancel or reschedule call [phone]. Contact information: [address] [place] [address] [phone]                Allergies[1]  Consultations: General surgery   Procedures/Studies: US  ABDOMEN LIMITED RUQ (LIVER/GB) Result Date: 05/01/2024 CLINICAL DATA:  Abdominal pain. Concern for acute cholecystitis on CT imaging earlier today. EXAM: ULTRASOUND ABDOMEN LIMITED RIGHT UPPER QUADRANT COMPARISON:  CT earlier today. FINDINGS: Gallbladder: Multiple  gallstones identified measuring up to at least 11 mm in size. Gallbladder wall is thick and irregular measuring up to 7-8 mm. No substantial pericholecystic fluid. Sonographer reports no sonographic Murphy sign. Common bile duct: Diameter: 5 mm Liver: 10 mm left hepatic cyst identified, as noted on CT earlier today. Portal vein is patent on color Doppler imaging with normal direction of blood flow towards the liver. Other: None. IMPRESSION: 1. Cholelithiasis with gallbladder wall thickening. No sonographic Murphy sign or pericholecystic fluid. Imaging features raise distinct concern for acute cholecystitis. No biliary dilatation. Electronically Signed   By: Camellia Candle M.D.   On: 05/01/2024 06:54   CT Angio Chest/Abd/Pel for Dissection W and/or Wo Contrast Result Date: 05/01/2024 EXAM: CTA CHEST, ABDOMEN AND PELVIS WITH AND WITHOUT CONTRAST 05/01/2024 04:03:06 AM TECHNIQUE: CTA of the chest was performed with and without the administration of intravenous contrast. CTA of the abdomen and pelvis was performed with the administration of intravenous contrast. Multiplanar reformatted images are provided for review. MIP images are provided for review. Automated exposure control, iterative reconstruction, and/or weight based adjustment of the mA/kV was utilized to reduce the radiation dose to as low as reasonably achievable. COMPARISON: None available. CLINICAL HISTORY: Severe chest pain that radiates into the back now with abdominal pain and leg cramping. FINDINGS: VASCULATURE: AORTA: The aorta is normal in course, caliber, and enhancement. No aneurysm, dissection, or stenosis is seen. There are mild calcific plaques in the descending thoracic aorta. Trace calcification in the abdominal aorta. PULMONARY ARTERIES: The pulmonary arteries are well opacified and normal in caliber without evidence of embolus. GREAT VESSELS OF AORTIC ARCH: The great vessels branch normally and are clear. No dissection. No arterial occlusion  or significant stenosis. 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 SUPERIOR MESENTERIC ARTERY: No acute finding. No occlusion or significant stenosis. INFERIOR MESENTERIC ARTERY: No acute finding. No occlusion or significant stenosis. RENAL ARTERIES: No acute finding. No occlusion or significant stenosis. ILIAC ARTERIES: The iliac arteries are tortuous but otherwise normal. No occlusion or significant stenosis. CHEST: MEDIASTINUM: The heart is slightly enlarged. No pericardial effusion or visible coronary calcification. No mediastinal lymphadenopathy. The pulmonary veins are nondilated. There is a small hiatal hernia. Moderate circumferential thickening in the distal thoracic esophagus is noted, warranting endoscopic follow-up. LUNGS AND PLEURA: There are mild biapical scarring changes in the lungs. Mild central bronchial thickening but no bronchial impactions. There are scattered linear scarlike opacities in the lung bases. There is no consolidation or effusion. There is a 4 mm rounded left upper lobe nodule on series 6, axial 49, level of the carina. There are no further nodules. THORACIC BONES AND SOFT TISSUES: At T6-T7, there is a right paracentral calcified disc extrusion causing a mild compression of the right hemicord. At T8-T9, a sizable central/left paracentral calcified disc extrusion causes more significant spinal canal stenosis  and compressive effect. There is osteopenia, kyphosis and degenerative changes of the thoracic spine. There is a 1.8 cm heterogeneous nodule in the mid pole of the right lobe of the thyroid  gland. Nonemergent follow-up ultrasound is recommended. No acute soft tissue abnormality. ABDOMEN AND PELVIS: LIVER: There is a 1 cm cyst medially in liver segment 5 on  series 5 axial 147, with a Hounsfield density of 19.5. No follow up imaging is recommended. There is a 1.3 cm cyst in segment 2, Hounsfield density is -3. No follow-up imaging is recommended. The remainder of the liver is homogeneous. GALLBLADDER AND BILE DUCTS: The gallbladder is mildly distended. There is thickening in the gallbladder wall, trace pericholecystic fluid. Findings concerning for cholecystitis. There are no calcified gallstones, but noncalcified stones can be missed with CT. No biliary dilatation is seen. SPLEEN: The spleen is unremarkable. PANCREAS: The pancreas is unremarkable. ADRENAL GLANDS: There is no adrenal mass. Bilateral adrenal glands demonstrate no acute abnormality. KIDNEYS, URETERS AND BLADDER: There is no renal mass. There is mild cortical thinning in both kidneys. No stones in the kidneys or ureters. No hydronephrosis. No perinephric or periureteral stranding. Urinary bladder is unremarkable. GI AND BOWEL: Stomach and duodenal sweep demonstrate no acute abnormality. There is no bowel obstruction or inflammation. The appendix is normal in caliber and well visible. There is no evidence of colitis or diverticulitis. No abnormal bowel wall thickening or distension. REPRODUCTIVE: The uterus and ovaries are normal in size. Reproductive organs are unremarkable. PERITONEUM AND RETROPERITONEUM: No ascites or free air. LYMPH NODES: No lymphadenopathy. ABDOMINAL BONES AND SOFT TISSUES: There is osteopenia, kyphosis and degenerative changes of the lumbar spine, at L4-L5 with advanced facet hypertrophy and severe acquired spinal stenosis, at L5-S1 with severe facet hypertrophy and moderate spinal stenosis, at both levels with grade 1 degenerative anterolisthesis. There is a small umbilical fat hernia. No acute soft tissue abnormality. IMPRESSION: 1. No evidence of aortic aneurysm or dissection, and no evidence of pulmonary embolism. 2. Findings concerning for acute cholecystitis. 3. Moderate  circumferential thickening in the distal thoracic esophagus, warranting endoscopic follow-up. 4. Calcified disc extrusions at T6-T7 and T8-T9 with spinal canal stenosis and cord compression, and severe acquired spinal canal stenosis at L4-L5. 5. 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 6. 1.8 cm heterogeneous nodule in the mid pole of the right lobe of the thyroid  gland, with nonemergent follow-up ultrasound recommended. 7. 4 mm left upper lobe pulmonary nodule, with no routine follow-up imaging recommended per Fleischner Society Guidelines if low risk; if high risk, optional non-contrast chest CT at 12 months. Electronically signed by: Francis Quam MD 05/01/2024 05:26 AM EST RP Workstation: [id number]      Subjective: Seen and examined the day of discharge.  Stable no distress but appropriate discharge home.  Discharge Exam: Vitals:   05/02/24 0415 05/02/24 0739  BP: (!) 142/91 (!) 156/76  Pulse: 76 86  Resp: 16 18  Temp: 97.9 F (36.6 C) 98 F (36.7 C)  SpO2: 95% 97%   Vitals:   05/01/24 1949 05/01/24 2241 05/02/24 0415 05/02/24 0739  BP: (!) 150/88  (!) 142/91 (!) 156/76  Pulse: 76  76 86  Resp: 14  16 18   Temp: 98 F (36.7 C)  97.9 F (36.6 C) 98 F (36.7 C)  TempSrc: Oral  Oral   SpO2: 90% 97% 95% 97%  Weight:      Height:  General: Pt is alert, awake, not in acute distress Cardiovascular: RRR, S1/S2 +, no rubs, no gallops Respiratory: CTA bilaterally, no wheezing, no rhonchi Abdominal: Soft, NT, ND, bowel sounds + Extremities: no edema, no cyanosis    The results of significant diagnostics from this hospitalization (including imaging, microbiology, ancillary and laboratory) are listed below for reference.     Microbiology: No results found for this or any previous visit (from the past 240 hours).   Labs: BNP (last 3 results) No results for  input(s): BNP in the last 8760 hours. Basic Metabolic Panel: Recent Labs  Lab 05/01/24 0309  NA 141  K 3.9  CL 104  CO2 27  GLUCOSE 114*  BUN 24*  CREATININE 0.89  CALCIUM  10.2  MG 2.1   Liver Function Tests: Recent Labs  Lab 05/01/24 0309  AST 22  ALT 15  ALKPHOS 89  BILITOT 0.3  PROT 6.9  ALBUMIN 4.4   Recent Labs  Lab 05/01/24 0309  LIPASE 25   No results for input(s): AMMONIA in the last 168 hours. CBC: Recent Labs  Lab 05/01/24 0309  WBC 11.2*  NEUTROABS 8.5*  HGB 11.7*  HCT 35.0*  MCV 88.6  PLT 259   Cardiac Enzymes: No results for input(s): CKTOTAL, CKMB, CKMBINDEX, TROPONINI in the last 168 hours. BNP: Invalid input(s): POCBNP CBG: No results for input(s): GLUCAP in the last 168 hours. D-Dimer No results for input(s): DDIMER in the last 72 hours. Hgb A1c No results for input(s): HGBA1C in the last 72 hours. Lipid Profile No results for input(s): CHOL, HDL, LDLCALC, TRIG, CHOLHDL, LDLDIRECT in the last 72 hours. Thyroid  function studies No results for input(s): TSH, T4TOTAL, T3FREE, THYROIDAB in the last 72 hours.  Invalid input(s): FREET3 Anemia work up No results for input(s): VITAMINB12, FOLATE, FERRITIN, TIBC, IRON, RETICCTPCT in the last 72 hours. Urinalysis    Component Value Date/Time   COLORURINE YELLOW (A) 05/01/2024 0410   APPEARANCEUR CLEAR (A) 05/01/2024 0410   LABSPEC 1.027 05/01/2024 0410   PHURINE 5.0 05/01/2024 0410   GLUCOSEU NEGATIVE 05/01/2024 0410   HGBUR NEGATIVE 05/01/2024 0410   BILIRUBINUR NEGATIVE 05/01/2024 0410   BILIRUBINUR neg 10/11/2022 0942   KETONESUR NEGATIVE 05/01/2024 0410   PROTEINUR NEGATIVE 05/01/2024 0410   UROBILINOGEN 0.2 10/11/2022 0942   NITRITE NEGATIVE 05/01/2024 0410   LEUKOCYTESUR NEGATIVE 05/01/2024 0410   Sepsis Labs Recent Labs  Lab 05/01/24 0309  WBC 11.2*   Microbiology No results found for this or any previous visit  (from the past 240 hours).   Time coordinating discharge: 40 minutes  SIGNED:   Calvin KATHEE Robson, MD  [facility] 05/02/2024, 11:48 AM Pager   If 7PM-7AM, please contact night-coverage     [1]  Allergies Allergen Reactions   Lisinopril  Other (See Comments)    Chest pain, dizziness, cough   Codeine  Hives    hair crawling    Hydrochlorothiazide  Anxiety    Jittery feeling

## 2024-05-05 ENCOUNTER — Telehealth: Payer: Self-pay

## 2024-05-05 LAB — SURGICAL PATHOLOGY

## 2024-05-05 NOTE — Transitions of Care (Post Inpatient/ED Visit) (Signed)
" ° °  05/05/2024  Name: Lauren Leblanc MRN: [id number] DOB: 1950-04-30  Today's TOC FU Call Status: Today's TOC FU Call Status:: Successful TOC FU Call Completed TOC FU Call Complete Date: 05/05/24  Patient's Name and Date of Birth confirmed. Name, DOB  Transition Care Management Follow-up Telephone Call Date of Discharge: 05/02/24 Discharge Facility: [facility]) Type of Discharge: Inpatient Admission Primary Inpatient Discharge Diagnosis:: cholecystitis How have you been since you were released from the hospital?: Better Any questions or concerns?: No  Items Reviewed: Did you receive and understand the discharge instructions provided?: Yes Medications obtained,verified, and reconciled?: Yes (Medications Reviewed) Any new allergies since your discharge?: No Dietary orders reviewed?: Yes Do you have support at home?: Yes People in Home [RPT]: spouse, sibling(s)  Medications Reviewed Today: Medications Reviewed Today     Reviewed by Emmitt Pan, LPN (Licensed Practical Nurse) on 05/05/24 at 1451  Med List Status: <None>   Medication Order Taking? Sig Documenting Provider Last Dose Status Informant  amLODipine  (NORVASC ) 5 MG tablet [id number] Yes Take 1 tablet (5 mg total) by mouth daily. Lemon Raisin, MD  Active Self, Pharmacy Records  Cholecalciferol  (VITAMIN D -3 PO) [id number] Yes Take 1 tablet by mouth daily. [provider]  Active Self, Pharmacy Records  Docusate Sodium  (COLACE PO) [id number] Yes Take 1 tablet by mouth daily. [provider]  Active Self, Pharmacy Records  losartan  (COZAAR ) 50 MG tablet [id number] Yes Take 1 tablet (50 mg total) by mouth daily. Lemon Raisin, MD  Active Self, Pharmacy Records  Multiple Vitamins-Minerals (ONE-A-DAY WOMENS 50 PLUS PO) [id number] Yes Take 1 tablet by mouth daily. [provider]  Active Self, Pharmacy Records  pantoprazole  (PROTONIX ) 40 MG tablet [id number] Yes Take 1  tablet (40 mg total) by mouth daily. Lemon Raisin, MD  Active Self, Pharmacy Records  triamcinolone  cream (KENALOG ) 0.1 % [id number] Yes Apply 1 Application topically 2 (two) times daily. [provider]  Active Self, Pharmacy Records            Home Care and Equipment/Supplies: Were Home Health Services Ordered?: NA Any new equipment or medical supplies ordered?: NA  Functional Questionnaire: Do you need assistance with bathing/showering or dressing?: No Do you need assistance with meal preparation?: No Do you need assistance with eating?: No Do you have difficulty maintaining continence: No Do you need assistance with getting out of bed/getting out of a chair/moving?: No Do you have difficulty managing or taking your medications?: No  Follow up appointments reviewed: PCP Follow-up appointment confirmed?: No (declined) MD Provider Line Number:[phone] Given: No Specialist Hospital Follow-up appointment confirmed?: Yes Date of Specialist follow-up appointment?: 05/14/24 Follow-Up Specialty Provider:: surgeon Do you need transportation to your follow-up appointment?: No Do you understand care options if your condition(s) worsen?: Yes-patient verbalized understanding    SIGNATURE Pan Emmitt, LPN [facility] Nurse Health Advisor Direct Dial [phone]  "

## 2024-05-14 ENCOUNTER — Encounter: Payer: Self-pay | Admitting: Physician Assistant

## 2024-05-14 ENCOUNTER — Encounter: Admitting: Physician Assistant

## 2024-05-14 VITALS — BP 147/77 | HR 67 | Ht 65.0 in | Wt 195.0 lb

## 2024-05-14 DIAGNOSIS — K810 Acute cholecystitis: Secondary | ICD-10-CM

## 2024-05-14 DIAGNOSIS — Z09 Encounter for follow-up examination after completed treatment for conditions other than malignant neoplasm: Secondary | ICD-10-CM

## 2024-05-14 NOTE — Patient Instructions (Addendum)
GENERAL POST-OPERATIVE PATIENT INSTRUCTIONS   WOUND CARE INSTRUCTIONS:  Try to keep the wound dry and avoid ointments on the wound unless directed to do so.  If the wound becomes bright red and painful or starts to drain infected material that is not clear, please contact your physician immediately.  If the wound is mildly pink and has a thick firm ridge underneath it, this is normal, and is referred to as a healing ridge.  This will resolve over the next 4-6 weeks.  BATHING: You may shower if you have been informed of this by your surgeon. However, Please do not submerge in a tub, hot tub, or pool until incisions are completely sealed or have been told by your surgeon that you may do so.  DIET:  You may eat any foods that you can tolerate.  It is a good idea to eat a high fiber diet and take in plenty of fluids to prevent constipation.  If you do become constipated you may want to take a mild laxative or take ducolax tablets on a daily basis until your bowel habits are regular.  Constipation can be very uncomfortable, along with straining, after recent surgery.  ACTIVITY:  You are encouraged to cough and deep breath or use your incentive spirometer if you were given one, every 15-30 minutes when awake.  This will help prevent respiratory complications and low grade fevers post-operatively if you had a general anesthetic.  You may want to hug a pillow when coughing and sneezing to add additional support to the surgical area, if you had abdominal or chest surgery, which will decrease pain during these times.  You are encouraged to walk and engage in light activity for the next two weeks.  You should not lift more than 20 pounds for 6 weeks total after surgery as it could put you at increased risk for complications.  Twenty pounds is roughly equivalent to a plastic bag of groceries. At that time- Listen to your body when lifting, if you have pain when lifting, stop and then try again in a few days. Soreness  after doing exercises or activities of daily living is normal as you get back in to your normal routine.  MEDICATIONS:  Try to take narcotic medications and anti-inflammatory medications, such as tylenol, ibuprofen, naprosyn, etc., with food.  This will minimize stomach upset from the medication.  Should you develop nausea and vomiting from the pain medication, or develop a rash, please discontinue the medication and contact your physician.  You should not drive, make important decisions, or operate machinery when taking narcotic pain medication.  SUNBLOCK Use sun block to incision area over the next year if this area will be exposed to sun. This helps decrease scarring and will allow you avoid a permanent darkened area over your incision.  QUESTIONS:  Please feel free to call our office if you have any questions, and we will be glad to assist you. [phone]

## 2024-05-14 NOTE — Progress Notes (Signed)
 [facility] POST-OP OFFICE VISIT  05/14/2024  HPI: Lauren Leblanc is a 74 y.o. female 13 days s/p robotic assisted laparoscopic cholecystectomy for acute cholecystitis   She is doing well  Soreness for about 1 week; now much better No fever, chills, nausea, emesis  She is tolerating PO; watching what she eats; no diarrhea Incisions are well healed, no other complaints  Ambulating without issues; working around her house   Vital signs: BP (!) 147/77   Pulse 67   Ht 5' 5 (1.651 m)   Wt 195 lb (88.5 kg)   SpO2 97%   BMI 32.45 kg/m    Physical Exam: Constitutional: Well appearing female, NAD Abdomen: Soft, non-tender, non-distended, no rebound/guarding Skin: Laparoscopic incisions are healing well, no erythema or drainage   Assessment/Plan: This is a 74 y.o. female 13 days s/p robotic assisted laparoscopic cholecystectomy for acute cholecystitis    - Pain control prn  - Reviewed wound care recommendation  - Reviewed lifting restrictions; 4 weeks total  - Reviewed surgical pathology; Chronic Cholecystitis   - She can follow up on as needed basis; She understands to call with questions/concerns  -- Arthea Platt, PA-C [facility] 05/14/2024, 2:20 PM M-F: 7am - 4pm

## 2024-05-21 ENCOUNTER — Ambulatory Visit (INDEPENDENT_AMBULATORY_CARE_PROVIDER_SITE_OTHER): Admitting: Student

## 2024-05-21 ENCOUNTER — Encounter: Payer: Self-pay | Admitting: Student

## 2024-05-21 VITALS — BP 126/82 | HR 73 | Ht 65.0 in | Wt 199.0 lb

## 2024-05-21 DIAGNOSIS — M4805 Spinal stenosis, thoracolumbar region: Secondary | ICD-10-CM | POA: Diagnosis not present

## 2024-05-21 DIAGNOSIS — E041 Nontoxic single thyroid nodule: Secondary | ICD-10-CM | POA: Insufficient documentation

## 2024-05-21 DIAGNOSIS — I10 Essential (primary) hypertension: Secondary | ICD-10-CM

## 2024-05-21 NOTE — Progress Notes (Signed)
 "  Established Patient Office Visit  Subjective   Patient ID: Lauren Leblanc, female    DOB: [birth year]  Age: 74 y.o. MRN: [id number]  Chief Complaint  Patient presents with   Hypertension    Lauren Leblanc is a 74 y.o. person with medical hx listed below who presents today for hypertension follow up. Please refer to problem based charting for further details and assessment and plan of current problem and chronic medical conditions.   Patient Active Problem List   Diagnosis Date Noted   Spinal stenosis of thoracolumbar region 05/21/2024   Thyroid  nodule 05/21/2024   Acute cholecystitis 05/01/2024   Osteopenia 03/10/2024   Insomnia 03/10/2024   Dyspnea on exertion 02/15/2024   Annual physical exam 08/13/2023   Statin intolerance 02/06/2022   Spondylolisthesis of lumbar region 06/18/2020   Sacroiliitis 06/18/2020   Status post total knee replacement using cement, left 04/13/2020   Gastroesophageal reflux disease 07/04/2019   Obesity (BMI 30.0-34.9) 03/13/2018   Primary osteoarthritis of left knee 03/13/2018   Hyperlipidemia, mixed 04/26/2017   Essential hypertension 01/06/2016      ROS Refer to HPI    Objective:     Outpatient Encounter Medications as of 05/21/2024  Medication Sig   amLODipine  (NORVASC ) 5 MG tablet Take 1 tablet (5 mg total) by mouth daily.   Cholecalciferol  (VITAMIN D -3 PO) Take 1 tablet by mouth daily.   Docusate Sodium  (COLACE PO) Take 1 tablet by mouth daily.   losartan  (COZAAR ) 50 MG tablet Take 1 tablet (50 mg total) by mouth daily. (Patient taking differently: Take 25 mg by mouth daily.)   Multiple Vitamins-Minerals (ONE-A-DAY WOMENS 50 PLUS PO) Take 1 tablet by mouth daily.   pantoprazole  (PROTONIX ) 40 MG tablet Take 1 tablet (40 mg total) by mouth daily.   triamcinolone  cream (KENALOG ) 0.1 % Apply 1 Application topically 2 (two) times daily.   No facility-administered encounter medications on file as of 05/21/2024.    BP 126/82    Pulse 73   Ht 5' 5 (1.651 m)   Wt 199 lb (90.3 kg)   SpO2 97%   BMI 33.12 kg/m  BP Readings from Last 3 Encounters:  05/21/24 126/82  05/14/24 (!) 147/77  05/02/24 (!) 156/76    Physical Exam Constitutional:      Appearance: Normal appearance.  HENT:     Head: Normocephalic and atraumatic.     Mouth/Throat:     Mouth: Mucous membranes are moist.     Pharynx: Oropharynx is clear.  Eyes:     Extraocular Movements: Extraocular movements intact.     Conjunctiva/sclera: Conjunctivae normal.     Pupils: Pupils are equal, round, and reactive to light.  Cardiovascular:     Rate and Rhythm: Normal rate and regular rhythm.  Pulmonary:     Effort: Pulmonary effort is normal.     Breath sounds: No rhonchi or rales.  Abdominal:     General: Abdomen is flat. Bowel sounds are normal. There is no distension.     Palpations: Abdomen is soft.     Tenderness: There is no abdominal tenderness.  Musculoskeletal:        General: Normal range of motion.     Right lower leg: No edema.     Left lower leg: No edema.  Skin:    General: Skin is warm and dry.     Capillary Refill: Capillary refill takes less than 2 seconds.  Neurological:     General: No focal deficit present.  Mental Status: She is alert and oriented to person, place, and time.  Psychiatric:        Mood and Affect: Mood normal.        Behavior: Behavior normal.        05/21/2024    1:03 PM 04/07/2024    1:11 PM 03/10/2024    1:13 PM  Depression screen PHQ 2/9  Decreased Interest 0 0 0  Down, Depressed, Hopeless 0 0 0  PHQ - 2 Score 0 0 0       05/21/2024    1:03 PM 04/07/2024    1:11 PM 03/10/2024    1:13 PM 02/12/2024    2:38 PM  GAD 7 : Generalized Anxiety Score  Nervous, Anxious, on Edge 0 0  0  0   Control/stop worrying 0 0  0  0   Worry too much - different things  0   0   Trouble relaxing  0   0   Restless  0   0   Easily annoyed or irritable  0   0   Afraid - awful might happen  0   0   Total  GAD 7 Score  0  0  Anxiety Difficulty  Not difficult at all  Not difficult at all     Data saved with a previous flowsheet row definition    No results found for any visits on 05/21/24.  Last CBC Lab Results  Component Value Date   WBC 11.2 (H) 05/01/2024   HGB 11.7 (L) 05/01/2024   HCT 35.0 (L) 05/01/2024   MCV 88.6 05/01/2024   MCH 29.6 05/01/2024   RDW 13.2 05/01/2024   PLT 259 05/01/2024   Last metabolic panel Lab Results  Component Value Date   GLUCOSE 114 (H) 05/01/2024   NA 141 05/01/2024   K 3.9 05/01/2024   CL 104 05/01/2024   CO2 27 05/01/2024   BUN 24 (H) 05/01/2024   CREATININE 0.89 05/01/2024   GFRNONAA >60 05/01/2024   CALCIUM  10.2 05/01/2024   PHOS 3.9 08/03/2021   PROT 6.9 05/01/2024   ALBUMIN 4.4 05/01/2024   LABGLOB 2.2 04/07/2024   AGRATIO 2.1 08/04/2022   BILITOT 0.3 05/01/2024   ALKPHOS 89 05/01/2024   AST 22 05/01/2024   ALT 15 05/01/2024   ANIONGAP 10 05/01/2024   Last lipids Lab Results  Component Value Date   CHOL 191 08/13/2023   HDL 47 08/13/2023   LDLCALC 103 (H) 08/13/2023   LDLDIRECT 122 (H) 02/03/2022   TRIG 243 (H) 08/13/2023   CHOLHDL 4.1 08/13/2023   Last hemoglobin A1c Lab Results  Component Value Date   HGBA1C 5.6 08/13/2023      The 10-year ASCVD risk score (Arnett DK, et al., 2019) is: 13.7%    Assessment & Plan:  Thyroid  nodule Assessment & Plan: Indicently noted CT angio. Is asymptomatic, TSH and Thyroid  US  ordered.   Orders: -     TSH -     US  THYROID ; Future  Spinal stenosis of thoracolumbar region Assessment & Plan: CT angio 05/02/2023: Calcified disc extrusions at T6-T7 and T8-T9 with spinal canal stenosis and cord compression, and severe acquired spinal canal stenosis at L4-L5. Reprots some stress incontinence symptom but otherwise not having significant symptoms. Referral to neursurgery   Orders: -     Ambulatory referral to Neurosurgery  Essential hypertension Assessment & Plan: Dizziness  with increase of losartan  to 50 mg daily. Currently taking losartan  25 mg daily and amlodipine .  Normotensive today, recently had cholecystectomy and not felt up to monitoring home pressure. Continue current medications.      Return in about 4 months (around 09/18/2024) for physical.    Harlene Saddler, MD "

## 2024-05-21 NOTE — Assessment & Plan Note (Addendum)
 Indicently noted CT angio. Is asymptomatic, TSH and Thyroid  US  ordered.

## 2024-05-21 NOTE — Assessment & Plan Note (Signed)
 CT angio 05/02/2023: Calcified disc extrusions at T6-T7 and T8-T9 with spinal canal stenosis and cord compression, and severe acquired spinal canal stenosis at L4-L5. Reprots some stress incontinence symptom but otherwise not having significant symptoms. Referral to neursurgery

## 2024-05-21 NOTE — Assessment & Plan Note (Addendum)
 Dizziness with increase of losartan  to 50 mg daily. Currently taking losartan  25 mg daily and amlodipine . Normotensive today, recently had cholecystectomy and not felt up to monitoring home pressure. Continue current medications.

## 2024-05-22 ENCOUNTER — Ambulatory Visit: Payer: Self-pay | Admitting: Student

## 2024-05-22 LAB — TSH: TSH: 2.76 u[IU]/mL (ref 0.450–4.500)

## 2024-05-30 ENCOUNTER — Ambulatory Visit: Admission: RE | Admit: 2024-05-30

## 2024-05-30 DIAGNOSIS — E041 Nontoxic single thyroid nodule: Secondary | ICD-10-CM

## 2024-08-18 ENCOUNTER — Encounter: Admitting: Student

## 2024-08-21 ENCOUNTER — Ambulatory Visit
